# Patient Record
Sex: Female | Born: 1990 | Race: Black or African American | Hispanic: No | Marital: Single | Smoking: Former smoker
Health system: Southern US, Community
[De-identification: ages and names within clinical notes are randomized; demographics above are authoritative.]

## PROBLEM LIST (undated history)

## (undated) ENCOUNTER — Inpatient Hospital Stay (HOSPITAL_COMMUNITY): Payer: Self-pay

## (undated) DIAGNOSIS — R519 Headache, unspecified: Secondary | ICD-10-CM

## (undated) DIAGNOSIS — J45909 Unspecified asthma, uncomplicated: Secondary | ICD-10-CM

## (undated) DIAGNOSIS — B009 Herpesviral infection, unspecified: Secondary | ICD-10-CM

## (undated) DIAGNOSIS — L732 Hidradenitis suppurativa: Secondary | ICD-10-CM

## (undated) DIAGNOSIS — G4730 Sleep apnea, unspecified: Secondary | ICD-10-CM

## (undated) DIAGNOSIS — K8020 Calculus of gallbladder without cholecystitis without obstruction: Secondary | ICD-10-CM

## (undated) DIAGNOSIS — A599 Trichomoniasis, unspecified: Secondary | ICD-10-CM

## (undated) DIAGNOSIS — D649 Anemia, unspecified: Secondary | ICD-10-CM

## (undated) HISTORY — PX: LAPAROSCOPIC CHOLECYSTECTOMY W/ CHOLANGIOGRAPHY: SUR757

## (undated) HISTORY — PX: OTHER SURGICAL HISTORY: SHX169

## (undated) HISTORY — DX: Headache, unspecified: R51.9

## (undated) HISTORY — DX: Unspecified asthma, uncomplicated: J45.909

## (undated) HISTORY — DX: Sleep apnea, unspecified: G47.30

---
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
## 2010-04-03 ENCOUNTER — Emergency Department (HOSPITAL_COMMUNITY): Admission: EM | Admit: 2010-04-03 | Discharge: 2010-04-03 | Payer: Self-pay | Admitting: Emergency Medicine

## 2011-03-06 LAB — URINALYSIS, ROUTINE W REFLEX MICROSCOPIC
Bilirubin Urine: NEGATIVE
Glucose, UA: NEGATIVE mg/dL
Hgb urine dipstick: NEGATIVE
Ketones, ur: NEGATIVE mg/dL
Nitrite: NEGATIVE
Protein, ur: NEGATIVE mg/dL
Specific Gravity, Urine: 1.01 (ref 1.005–1.030)
Urobilinogen, UA: 0.2 mg/dL (ref 0.0–1.0)
pH: 7 (ref 5.0–8.0)

## 2011-03-06 LAB — URINE MICROSCOPIC-ADD ON

## 2011-03-06 LAB — URINE CULTURE

## 2011-03-06 LAB — POCT PREGNANCY, URINE: Preg Test, Ur: NEGATIVE

## 2011-11-09 LAB — OB RESULTS CONSOLE ANTIBODY SCREEN: Antibody Screen: NEGATIVE

## 2011-11-09 LAB — OB RESULTS CONSOLE ABO/RH

## 2011-12-06 ENCOUNTER — Inpatient Hospital Stay (HOSPITAL_COMMUNITY)
Admission: AD | Admit: 2011-12-06 | Discharge: 2011-12-06 | Disposition: A | Discharge status: Home / Self Care | Payer: Medicaid Other | Source: Ambulatory Visit | Attending: Obstetrics & Gynecology | Admitting: Obstetrics & Gynecology

## 2011-12-06 ENCOUNTER — Encounter (HOSPITAL_COMMUNITY): Payer: Self-pay | Admitting: *Deleted

## 2011-12-06 DIAGNOSIS — N390 Urinary tract infection, site not specified: Secondary | ICD-10-CM

## 2011-12-06 DIAGNOSIS — O219 Vomiting of pregnancy, unspecified: Secondary | ICD-10-CM

## 2011-12-06 DIAGNOSIS — J069 Acute upper respiratory infection, unspecified: Secondary | ICD-10-CM | POA: Insufficient documentation

## 2011-12-06 DIAGNOSIS — O99891 Other specified diseases and conditions complicating pregnancy: Secondary | ICD-10-CM | POA: Insufficient documentation

## 2011-12-06 MED ORDER — GUAIFENESIN ER 600 MG PO TB12: 1200.0000 mg | ORAL_TABLET | Freq: Two times a day (BID) | ORAL | Status: DC

## 2011-12-06 MED ORDER — SULFAMETHOXAZOLE-TMP DS 800-160 MG PO TABS: 1.0000 | ORAL_TABLET | Freq: Two times a day (BID) | ORAL | Status: AC

## 2011-12-06 MED ORDER — ONDANSETRON HCL 4 MG PO TABS: 4.0000 mg | ORAL_TABLET | Freq: Every day | ORAL | Status: DC | PRN

## 2011-12-06 MED ORDER — LORATADINE 10 MG PO TABS: 10.0000 mg | ORAL_TABLET | Freq: Every day | ORAL | Status: DC

## 2011-12-06 MED ORDER — DEXTROMETHORPHAN HBR 15 MG/5ML PO SYRP: 10.0000 mL | ORAL_SOLUTION | Freq: Four times a day (QID) | ORAL | Status: AC | PRN

## 2011-12-06 NOTE — Progress Notes (Signed)
N. Frazier, CNM at bedside.  Assessment done and poc discussed with pt.  

## 2011-12-06 NOTE — Progress Notes (Signed)
PT SAYS HAS HAD RUNNY/ STUFFY NOSE X 2 DAYS, SORE THROAT, H/A.  VOMITING  HAS HAD ALL PREG.     HAS HAD LOOSE STOOLS.    NO FEVER AT HOME.  SAYS COUGHING AT HOME- NONE IN TRIAGE.   DENIES CRAMPING.    TOOK TYLENOL AT  6 PM  FOR H/A-  LITTLE RELIEF.

## 2011-12-06 NOTE — Progress Notes (Signed)
Pt presents to [facility] for cold symptoms that started 3 days ago.

## 2011-12-06 NOTE — ED Provider Notes (Signed)
History     No chief complaint on file.  HPI 20 y.o. G1P0 at [redacted]w[redacted]d with c/o headache, sore throat, cough, congestion x 3 days, tylenol helps with headache, hasn't tried any other meds. Denies fever. Vomiting throughout pregnancy, about every other day.    Past Medical History  Diagnosis Date  . No pertinent past medical history     History reviewed. No pertinent past surgical history.  History reviewed. No pertinent family history.  History  Substance Use Topics  . Smoking status: Never Smoker   . Smokeless tobacco: Not on file  . Alcohol Use: No    Allergies: No Known Allergies  Prescriptions prior to admission  Medication Sig Dispense Refill  . acetaminophen (TYLENOL) 325 MG suppository Place 325 mg rectally every 4 (four) hours as needed. Head ache       . Prenatal Vit-Fe Fumarate-FA (PRENATAL MULTIVITAMIN) TABS Take 1 tablet by mouth daily.          Review of Systems  Constitutional: Negative.  Negative for fever.  HENT: Positive for congestion and sore throat.   Respiratory: Positive for cough.   Cardiovascular: Negative.   Gastrointestinal: Negative for nausea, vomiting, abdominal pain, diarrhea and constipation.  Genitourinary: Negative for dysuria, urgency, frequency, hematuria and flank pain.       Negative for vaginal bleeding, cramping/contractions  Musculoskeletal: Negative.   Neurological: Positive for headaches.  Psychiatric/Behavioral: Negative.    Physical Exam   Blood pressure 107/65, pulse 85, temperature 98.8 F (37.1 C), temperature source Oral, resp. rate 20, height 5\' 9"  (1.753 m), weight 218 lb (98.884 kg), SpO2 98.00%.  Physical Exam  Nursing note and vitals reviewed. Constitutional: She is oriented to person, place, and time. She appears well-developed and well-nourished. No distress.  HENT:  Head: Normocephalic and atraumatic.  Right Ear: Tympanic membrane, external ear and ear canal normal.  Left Ear: Tympanic membrane, external ear  and ear canal normal.  Nose: Mucosal edema and rhinorrhea present.  Mouth/Throat: Uvula is midline and mucous membranes are normal. Posterior oropharyngeal erythema present. No oropharyngeal exudate.  Cardiovascular: Normal rate and regular rhythm.   Respiratory: Effort normal and breath sounds normal. No respiratory distress. She has no wheezes. She has no rales.  GI: Soft. She exhibits no distension. There is no tenderness.  Musculoskeletal: Normal range of motion.  Neurological: She is alert and oriented to person, place, and time.  Skin: Skin is warm and dry.  Psychiatric: She has a normal mood and affect.    MAU Course  Procedures    Assessment and Plan  20 y.o. G1P0 at [redacted]w[redacted]d URI - symptomatic treatment, f/u if no improvement if 7 days   FRAZIER,NATALIE 12/06/2011, 9:31 PM

## 2011-12-06 NOTE — Progress Notes (Signed)
LAST TIME VOMITED  WAS THIS AM-  DOESN;T TAKE ANY MEDS.  PT YAWNING A LOT.

## 2011-12-18 NOTE — L&D Delivery Note (Signed)
He is in anDelivery Note At 1:48 AM a viable female was delivered via Vaginal, Spontaneous Delivery (Presentation: Left Occiput Anterior).  APGAR: 8, 9; weight .   Placenta status: Intact, Spontaneous.  Cord: 3 vessels with the following complications: None.  Cord pH: none  Anesthesia: Epidural  Episiotomy: None Lacerations: Periurethral Suture Repair: 3.0 vicryl rapide Est. Blood Loss (mL):   Mom to postpartum.  Baby to nursery-stable.  Jessica Vasquez A 04/09/2012, 2:13 AM

## 2012-01-09 LAB — OB RESULTS CONSOLE RUBELLA ANTIBODY, IGM: Rubella: IMMUNE

## 2012-01-09 LAB — OB RESULTS CONSOLE GC/CHLAMYDIA: Gonorrhea: NEGATIVE

## 2012-01-09 LAB — OB RESULTS CONSOLE HEPATITIS B SURFACE ANTIGEN: Hepatitis B Surface Ag: NEGATIVE

## 2012-01-10 ENCOUNTER — Encounter (HOSPITAL_COMMUNITY): Payer: Self-pay | Admitting: *Deleted

## 2012-01-10 ENCOUNTER — Inpatient Hospital Stay (HOSPITAL_COMMUNITY)
Admission: AD | Admit: 2012-01-10 | Discharge: 2012-01-10 | Disposition: A | Discharge status: Home / Self Care | Payer: 59 | Source: Ambulatory Visit | Attending: Obstetrics | Admitting: Obstetrics

## 2012-01-10 ENCOUNTER — Inpatient Hospital Stay (HOSPITAL_COMMUNITY): Payer: 59

## 2012-01-10 DIAGNOSIS — O4690 Antepartum hemorrhage, unspecified, unspecified trimester: Secondary | ICD-10-CM | POA: Insufficient documentation

## 2012-01-10 DIAGNOSIS — O4693 Antepartum hemorrhage, unspecified, third trimester: Secondary | ICD-10-CM

## 2012-01-10 LAB — WET PREP, GENITAL
Clue Cells Wet Prep HPF POC: NONE SEEN
Trich, Wet Prep: NONE SEEN

## 2012-01-10 NOTE — ED Provider Notes (Signed)
History     Chief Complaint  Patient presents with  . Vaginal Bleeding   HPI This is a 21 y.o. G1P0 at [redacted]w[redacted]d who presents with c/o bright red vaginal bleeding an hour ago. States it was "alot" but did not have a pad on when she arrived. No pain or leaking of fluid. + fetal movement. Denies any pregnancy problems.  OB History    Grav Para Term Preterm Abortions TAB SAB Ect Mult Living   1               Past Medical History  Diagnosis Date  . No pertinent past medical history     Past Surgical History  Procedure Date  . No past surgeries     History reviewed. No pertinent family history.  History  Substance Use Topics  . Smoking status: Never Smoker   . Smokeless tobacco: Not on file  . Alcohol Use: No    Allergies: No Known Allergies  Prescriptions prior to admission  Medication Sig Dispense Refill  . guaiFENesin (MUCINEX) 600 MG 12 hr tablet Take 1,200 mg by mouth 2 (two) times daily as needed. For cold symptoms      . guaiFENesin (ROBITUSSIN) 100 MG/5ML liquid Take 200 mg by mouth 3 (three) times daily as needed. For cold symptoms      . Prenatal Vit-Fe Fumarate-FA (PRENATAL MULTIVITAMIN) TABS Take 1 tablet by mouth daily.        Marland Kitchen DISCONTD: guaiFENesin (MUCINEX) 600 MG 12 hr tablet Take 2 tablets (1,200 mg total) by mouth 2 (two) times daily.  30 tablet  0    ROS As above  Physical Exam   Blood pressure 122/63, pulse 74, temperature 97.4 F (36.3 C), temperature source Oral, resp. rate 20, height 5\' 9"  (1.753 m), weight 224 lb 8 oz (101.833 kg), SpO2 99.00%.  Physical Exam  Constitutional: She is oriented to person, place, and time. She appears well-developed and well-nourished. No distress.  HENT:  Head: Normocephalic.  Cardiovascular: Normal rate.   Respiratory: Effort normal.  GI: Soft. She exhibits no distension. There is no tenderness. There is no rebound and no guarding.  Genitourinary: Uterus normal. Vaginal discharge (white discharge only , NO sign  of any blood in vault, Cervix long and closed) found.       FHR reactive with no contractions.   Musculoskeletal: Normal range of motion.  Neurological: She is alert and oriented to person, place, and time.  Skin: Skin is warm and dry.  Psychiatric: She has a normal mood and affect.  Verbal report from Sonographer:  No previa  MAU Course  Procedures Discussed with Dr Clearance Coots. WIll check placental location since no prenatal records are in system yet.   Assessment and Plan  A:  Report of vaginal bleeding with No clinical evidence of any bleediing      SIUP at [redacted]w[redacted]d P:  D/C home with reassurance per Dr Clearance Coots.  Wynelle Bourgeois 01/10/2012, 10:16 PM

## 2012-01-10 NOTE — Progress Notes (Signed)
Small amount of vaginal bleeding that started today. Patient also states she has been having thick white discharge x 1week

## 2012-01-11 LAB — GC/CHLAMYDIA PROBE AMP, GENITAL: GC Probe Amp, Genital: NEGATIVE

## 2012-01-17 ENCOUNTER — Inpatient Hospital Stay (HOSPITAL_COMMUNITY)
Admission: AD | Admit: 2012-01-17 | Discharge: 2012-01-17 | Disposition: A | Discharge status: Home / Self Care | Payer: 59 | Source: Ambulatory Visit | Attending: Obstetrics & Gynecology | Admitting: Obstetrics & Gynecology

## 2012-01-17 ENCOUNTER — Encounter (HOSPITAL_COMMUNITY): Payer: Self-pay | Admitting: *Deleted

## 2012-01-17 DIAGNOSIS — O368190 Decreased fetal movements, unspecified trimester, not applicable or unspecified: Secondary | ICD-10-CM | POA: Insufficient documentation
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
## 2012-03-12 ENCOUNTER — Encounter (HOSPITAL_COMMUNITY): Payer: Self-pay | Admitting: *Deleted

## 2012-03-12 ENCOUNTER — Inpatient Hospital Stay (HOSPITAL_COMMUNITY)
Admission: AD | Admit: 2012-03-12 | Discharge: 2012-03-12 | Disposition: A | Discharge status: Home / Self Care | Payer: Medicaid Other | Attending: Obstetrics | Admitting: Obstetrics

## 2012-03-12 DIAGNOSIS — O479 False labor, unspecified: Secondary | ICD-10-CM | POA: Insufficient documentation

## 2012-03-12 NOTE — MAU Provider Note (Signed)
  History   Pt presents today c/o possible SROM. She is currently 37.1wks and states when she woke up this am, she noticed that her underwear was slightly wet. She denies continued "leaking" and states she has not had to wear a pad. She reports GFM. She denies vag bleeding or any other sx at this time.   CSN: [id number]  Arrival date and time: 03/12/12 0646   None     No chief complaint on file.  HPI  OB History    Grav Para Term Preterm Abortions TAB SAB Ect Mult Living   1               Past Medical History  Diagnosis Date  . No pertinent past medical history     Past Surgical History  Procedure Date  . Nasal sugery     No family history on file.  History  Substance Use Topics  . Smoking status: Former Smoker    Quit date: 05/16/2011  . Smokeless tobacco: Not on file  . Alcohol Use: No    Allergies: No Known Allergies  Prescriptions prior to admission  Medication Sig Dispense Refill  . Prenatal Vit-Fe Fumarate-FA (PRENATAL MULTIVITAMIN) TABS Take 1 tablet by mouth daily.         Review of Systems  Constitutional: Negative for fever and chills.  Eyes: Negative for blurred vision and double vision.  Respiratory: Negative for cough, hemoptysis, sputum production, shortness of breath and wheezing.   Cardiovascular: Negative for chest pain and palpitations.  Gastrointestinal: Negative for nausea, vomiting, abdominal pain, diarrhea and constipation.  Genitourinary: Negative for dysuria, urgency, frequency and hematuria.  Neurological: Negative for dizziness and headaches.  Psychiatric/Behavioral: Negative for depression and suicidal ideas.   Physical Exam   Blood pressure 118/71, pulse 87, temperature 98 F (36.7 C), temperature source Oral, resp. rate 20, height 5\' 9"  (1.753 m), weight 228 lb 2 oz (103.477 kg).  Physical Exam  Nursing note and vitals reviewed. Constitutional: She is oriented to person, place, and time. She appears well-developed and  well-nourished. No distress.  HENT:  Head: Normocephalic and atraumatic.  Eyes: Pupils are equal, round, and reactive to light.  GI: Soft. She exhibits no distension and no mass. There is no tenderness. There is no rebound and no guarding.  Genitourinary: No bleeding around the vagina. No vaginal discharge found.       Cervix is friable. No pooling noted in the vagina. Cervix Lg/closed.  Neurological: She is alert and oriented to person, place, and time.  Skin: Skin is warm and dry. She is not diaphoretic.  Psychiatric: She has a normal mood and affect. Her behavior is normal. Judgment and thought content normal.    MAU Course  Procedures  Fern test negative.   NST reactive with some irritability. No consistent ctx pattern noted.  Assessment and Plan  Braxton Hicks ctx: pt likely experienced some stress urinary incontinence as a result of fetal position and movement. No evidence of SROM at this time. She has an appt later today with Dr. Clearance Coots. She will keep this appt. Discussed diet, activity, risks, and precautions.   Clinton Gallant. Elio Haden III, DrHSc, MPAS, PA-C  03/12/2012, 7:22 AM

## 2012-03-12 NOTE — Discharge Instructions (Signed)

## 2012-03-12 NOTE — MAU Note (Signed)
PT SAYS WAS ASLEEP- UP TO B-ROOM- BED WET-  DID NOT SMELL LIKE URINE.  NO  FLUID NOW- AND NONE SINCE.   NO VE IN OFFICE

## 2012-04-08 ENCOUNTER — Inpatient Hospital Stay (HOSPITAL_COMMUNITY)
Admission: RE | Admit: 2012-04-08 | Discharge: 2012-04-11 | DRG: 775 | Disposition: A | Discharge status: Home / Self Care | Payer: 59 | Source: Ambulatory Visit | Attending: Obstetrics | Admitting: Obstetrics

## 2012-04-08 ENCOUNTER — Inpatient Hospital Stay (HOSPITAL_COMMUNITY): Payer: 59 | Admitting: Anesthesiology

## 2012-04-08 ENCOUNTER — Encounter (HOSPITAL_COMMUNITY): Payer: Self-pay | Admitting: Anesthesiology

## 2012-04-08 ENCOUNTER — Encounter (HOSPITAL_COMMUNITY): Payer: Self-pay

## 2012-04-08 DIAGNOSIS — Z22330 Carrier of Group B streptococcus: Secondary | ICD-10-CM

## 2012-04-08 DIAGNOSIS — O99892 Other specified diseases and conditions complicating childbirth: Secondary | ICD-10-CM | POA: Diagnosis present

## 2012-04-08 DIAGNOSIS — O480 Post-term pregnancy: Principal | ICD-10-CM | POA: Diagnosis present

## 2012-04-08 LAB — CBC
MCV: 86.1 fL (ref 78.0–100.0)
Platelets: 205 10*3/uL (ref 150–400)
RDW: 13.8 % (ref 11.5–15.5)
WBC: 7.9 10*3/uL (ref 4.0–10.5)

## 2012-04-08 LAB — RPR: RPR Ser Ql: NONREACTIVE

## 2012-04-08 MED ORDER — IBUPROFEN 600 MG PO TABS: 600.0000 mg | ORAL_TABLET | Freq: Four times a day (QID) | ORAL | Status: DC | PRN

## 2012-04-08 MED ORDER — FLEET ENEMA 7-19 GM/118ML RE ENEM: 1.0000 | ENEMA | RECTAL | Status: DC | PRN

## 2012-04-08 MED ORDER — FENTANYL 2.5 MCG/ML BUPIVACAINE 1/10 % EPIDURAL INFUSION (WH - ANES)
14.0000 mL/h | INTRAMUSCULAR | Status: DC
  Administered 2012-04-08: 14 mL/h via EPIDURAL
  Filled 2012-04-08 (×2): qty 60

## 2012-04-08 MED ORDER — OXYCODONE-ACETAMINOPHEN 5-325 MG PO TABS: 1.0000 | ORAL_TABLET | ORAL | Status: DC | PRN

## 2012-04-08 MED ORDER — PENICILLIN G POTASSIUM 5000000 UNITS IJ SOLR
2.5000 10*6.[IU] | INTRAVENOUS | Status: DC
  Administered 2012-04-08 (×3): 2.5 10*6.[IU] via INTRAVENOUS
  Filled 2012-04-08 (×7): qty 2.5

## 2012-04-08 MED ORDER — DEXTROSE 5 % IV SOLN
5.0000 10*6.[IU] | Freq: Once | INTRAVENOUS | Status: AC
  Administered 2012-04-08: 5 10*6.[IU] via INTRAVENOUS
  Filled 2012-04-08: qty 5

## 2012-04-08 MED ORDER — PROCHLORPERAZINE EDISYLATE 5 MG/ML IJ SOLN
10.0000 mg | Freq: Four times a day (QID) | INTRAMUSCULAR | Status: DC | PRN
  Administered 2012-04-08: 10 mg via INTRAMUSCULAR
  Filled 2012-04-08: qty 2

## 2012-04-08 MED ORDER — OXYTOCIN 20 UNITS IN LACTATED RINGERS INFUSION - SIMPLE: 125.0000 mL/h | Freq: Once | INTRAVENOUS | Status: DC

## 2012-04-08 MED ORDER — OXYTOCIN BOLUS FROM INFUSION
500.0000 mL | Freq: Once | INTRAVENOUS | Status: DC
  Filled 2012-04-08: qty 1000
  Filled 2012-04-08: qty 500

## 2012-04-08 MED ORDER — FENTANYL 2.5 MCG/ML BUPIVACAINE 1/10 % EPIDURAL INFUSION (WH - ANES)
INTRAMUSCULAR | Status: DC | PRN
  Administered 2012-04-08: 14 mL/h via EPIDURAL

## 2012-04-08 MED ORDER — LACTATED RINGERS IV SOLN: 500.0000 mL | INTRAVENOUS | Status: DC | PRN

## 2012-04-08 MED ORDER — LACTATED RINGERS IV SOLN: 500.0000 mL | Freq: Once | INTRAVENOUS | Status: DC

## 2012-04-08 MED ORDER — PHENYLEPHRINE 40 MCG/ML (10ML) SYRINGE FOR IV PUSH (FOR BLOOD PRESSURE SUPPORT): 80.0000 ug | PREFILLED_SYRINGE | INTRAVENOUS | Status: DC | PRN

## 2012-04-08 MED ORDER — DIPHENHYDRAMINE HCL 50 MG/ML IJ SOLN: 12.5000 mg | INTRAMUSCULAR | Status: DC | PRN

## 2012-04-08 MED ORDER — PHENYLEPHRINE 40 MCG/ML (10ML) SYRINGE FOR IV PUSH (FOR BLOOD PRESSURE SUPPORT)
80.0000 ug | PREFILLED_SYRINGE | INTRAVENOUS | Status: DC | PRN
  Filled 2012-04-08: qty 5

## 2012-04-08 MED ORDER — MISOPROSTOL 25 MCG QUARTER TABLET
25.0000 ug | ORAL_TABLET | ORAL | Status: DC | PRN
  Administered 2012-04-08: 25 ug via VAGINAL
  Filled 2012-04-08: qty 0.25
  Filled 2012-04-08: qty 1

## 2012-04-08 MED ORDER — NALBUPHINE HCL 10 MG/ML IJ SOLN
10.0000 mg | Freq: Four times a day (QID) | INTRAMUSCULAR | Status: DC | PRN
  Administered 2012-04-08: 10 mg via INTRAMUSCULAR
  Filled 2012-04-08: qty 1

## 2012-04-08 MED ORDER — ONDANSETRON HCL 4 MG/2ML IJ SOLN: 4.0000 mg | Freq: Four times a day (QID) | INTRAMUSCULAR | Status: DC | PRN

## 2012-04-08 MED ORDER — EPHEDRINE 5 MG/ML INJ: 10.0000 mg | INTRAVENOUS | Status: DC | PRN

## 2012-04-08 MED ORDER — LIDOCAINE HCL (PF) 1 % IJ SOLN
30.0000 mL | INTRAMUSCULAR | Status: DC | PRN
  Filled 2012-04-08: qty 30

## 2012-04-08 MED ORDER — CITRIC ACID-SODIUM CITRATE 334-500 MG/5ML PO SOLN: 30.0000 mL | ORAL | Status: DC | PRN

## 2012-04-08 MED ORDER — OXYTOCIN 20 UNITS IN LACTATED RINGERS INFUSION - SIMPLE
1.0000 m[IU]/min | INTRAVENOUS | Status: DC
  Administered 2012-04-08: 1 m[IU]/min via INTRAVENOUS

## 2012-04-08 MED ORDER — TERBUTALINE SULFATE 1 MG/ML IJ SOLN: 0.2500 mg | Freq: Once | INTRAMUSCULAR | Status: AC | PRN

## 2012-04-08 MED ORDER — LACTATED RINGERS IV SOLN: INTRAVENOUS | Status: DC

## 2012-04-08 MED ORDER — EPHEDRINE 5 MG/ML INJ
10.0000 mg | INTRAVENOUS | Status: DC | PRN
  Filled 2012-04-08: qty 4

## 2012-04-08 MED ORDER — LIDOCAINE HCL (PF) 1 % IJ SOLN
INTRAMUSCULAR | Status: DC | PRN
  Administered 2012-04-08: 4 mL
  Administered 2012-04-08: 5 mL

## 2012-04-08 MED ORDER — NALBUPHINE SYRINGE 5 MG/0.5 ML
10.0000 mg | INJECTION | INTRAMUSCULAR | Status: DC | PRN
  Administered 2012-04-08: 10 mg via INTRAVENOUS
  Filled 2012-04-08 (×2): qty 1

## 2012-04-08 MED ORDER — ACETAMINOPHEN 325 MG PO TABS: 650.0000 mg | ORAL_TABLET | ORAL | Status: DC | PRN

## 2012-04-08 NOTE — Anesthesia Preprocedure Evaluation (Signed)
Anesthesia Evaluation  Patient identified by MRN, date of birth, ID band Patient awake    Reviewed: Allergy & Precautions, H&P , Patient's Chart, lab work & pertinent test results  Airway Mallampati: II TM Distance: >3 FB Neck ROM: full    Dental No notable dental hx. (+) Teeth Intact   Pulmonary neg pulmonary ROS,  breath sounds clear to auscultation  Pulmonary exam normal       Cardiovascular negative cardio ROS  Rhythm:regular Rate:Normal     Neuro/Psych negative neurological ROS  negative psych ROS   GI/Hepatic negative GI ROS, Neg liver ROS,   Endo/Other  negative endocrine ROS  Renal/GU negative Renal ROS  negative genitourinary   Musculoskeletal   Abdominal Normal abdominal exam  (+)   Peds  Hematology negative hematology ROS (+)   Anesthesia Other Findings   Reproductive/Obstetrics (+) Pregnancy                           Anesthesia Physical Anesthesia Plan  ASA: II  Anesthesia Plan: Epidural   Post-op Pain Management:    Induction:   Airway Management Planned:   Additional Equipment:   Intra-op Plan:   Post-operative Plan:   Informed Consent: I have reviewed the patients History and Physical, chart, labs and discussed the procedure including the risks, benefits and alternatives for the proposed anesthesia with the patient or authorized representative who has indicated his/her understanding and acceptance.     Plan Discussed with: Anesthesiologist and Surgeon  Anesthesia Plan Comments:         Anesthesia Quick Evaluation  

## 2012-04-08 NOTE — Progress Notes (Signed)
Jessica Vasquez is a 21 y.o. G1P0 at [redacted]w[redacted]d by LMP admitted for induction of labor due to Post dates. Due date 04-01-12.  Subjective:   Objective: BP 117/57  Pulse 81  Temp(Src) 98.2 F (36.8 C) (Oral)  Resp 20  Ht 5\' 9"  (1.753 m)  Wt 104.327 kg (230 lb)  BMI 33.96 kg/m2      FHT:  FHR: 150 bpm, variability: moderate,  accelerations:  Present,  decelerations:  Absent UC:   regular, every 3 minutes SVE:   Dilation: 2 Effacement (%): 70 Station: 0 Exam by:: lee  Labs: Lab Results  Component Value Date   WBC 7.9 04/08/2012   HGB 11.0* 04/08/2012   HCT 33.4* 04/08/2012   MCV 86.1 04/08/2012   PLT 205 04/08/2012    Assessment / Plan: 41 weeks.  2 stage IOL.  Labor: Progressing normally Preeclampsia:  n/a Fetal Wellbeing:  Category I Pain Control:  Labor support without medications I/D:  n/a Anticipated MOD:  NSVD  Jessica Vasquez A 04/08/2012, 1:14 PM

## 2012-04-08 NOTE — H&P (Signed)
Jessica Vasquez is a 21 y.o. female presenting for IOL.. Maternal Medical History:  Reason for admission: Presents for IOL for postdates.  21 yo G1 at 41 weeks.  Contractions: Frequency: rare.   Duration is approximately 30 seconds.   Perceived severity is mild.    Fetal activity: Perceived fetal activity is normal.   Last perceived fetal movement was within the past hour.    Prenatal complications: no prenatal complications   OB History    Grav Para Term Preterm Abortions TAB SAB Ect Mult Living   1              Past Medical History  Diagnosis Date  . No pertinent past medical history    Past Surgical History  Procedure Date  . Nasal sugery    Family History: family history is not on file. Social History:  reports that she quit smoking about 10 months ago. She has never used smokeless tobacco. She reports that she does not drink alcohol or use illicit drugs.  Review of Systems  All other systems reviewed and are negative.      Blood pressure 123/71, pulse 84, temperature 97.6 F (36.4 C), temperature source Oral, resp. rate 20, height 5\' 9"  (1.753 m), weight 104.327 kg (230 lb). Maternal Exam:  Abdomen: Patient reports no abdominal tenderness. Fetal presentation: vertex  Introitus: Normal vulva. Normal vagina.    Physical Exam  Nursing note and vitals reviewed. Constitutional: She is oriented to person, place, and time. She appears well-developed and well-nourished.  Eyes: Conjunctivae are normal. Pupils are equal, round, and reactive to light.  Neck: Normal range of motion.  Cardiovascular: Normal rate.   Respiratory: Effort normal.  GI: Soft.  Musculoskeletal: Normal range of motion.  Neurological: She is alert and oriented to person, place, and time. She has normal reflexes.  Skin: Skin is warm and dry.    Prenatal labs: ABO, Rh: AB/Positive/-- (11/23 0000) Antibody: Negative (11/23 0000) Rubella: Immune (01/23 0000) RPR: Nonreactive (01/23 0000)  HBsAg:  Negative (01/23 0000)  HIV: Non-reactive (01/23 0000)  GBS: Positive (03/21 0000)   Assessment/Plan: 41 weeks.  2 stage IOL.   Caterra Ostroff A 04/08/2012, 9:17 AM

## 2012-04-08 NOTE — Anesthesia Procedure Notes (Signed)
Epidural Patient location during procedure: OB Start time: 04/08/2012 6:51 PM  Staffing Anesthesiologist: Starlee Corralejo A. Performed by: anesthesiologist   Preanesthetic Checklist Completed: patient identified, site marked, surgical consent, pre-op evaluation, timeout performed, IV checked, risks and benefits discussed and monitors and equipment checked  Epidural Patient position: sitting Prep: site prepped and draped and DuraPrep Patient monitoring: continuous pulse ox and blood pressure Approach: midline Injection technique: LOR air  Needle:  Needle type: Tuohy  Needle gauge: 17 G Needle length: 9 cm Needle insertion depth: 6 cm Catheter type: closed end flexible Catheter size: 19 Gauge Catheter at skin depth: 11 cm Test dose: negative and Other  Assessment Events: blood not aspirated, injection not painful, no injection resistance, negative IV test and no paresthesia  Additional Notes Patient identified. Risks and benefits discussed including failed block, incomplete  Pain control, post dural puncture headache, nerve damage, paralysis, blood pressure Changes, nausea, vomiting, reactions to medications-both toxic and allergic and post Partum back pain. All questions were answered. Patient expressed understanding and wished to proceed. Sterile technique was used throughout procedure. Epidural site was Dressed with sterile barrier dressing. No paresthesias, signs of intravascular injection Or signs of intrathecal spread were encountered.  Patient was more comfortable after the epidural was dosed. Please see RN's note for documentation of vital signs and FHR which are stable.

## 2012-04-09 ENCOUNTER — Encounter (HOSPITAL_COMMUNITY): Payer: Self-pay

## 2012-04-09 MED ORDER — OXYCODONE-ACETAMINOPHEN 5-325 MG PO TABS
1.0000 | ORAL_TABLET | ORAL | Status: DC | PRN
  Administered 2012-04-10 – 2012-04-11 (×2): 1 via ORAL
  Filled 2012-04-09 (×2): qty 1

## 2012-04-09 MED ORDER — OXYTOCIN 10 UNIT/ML IJ SOLN
INTRAMUSCULAR | Status: AC
  Filled 2012-04-09: qty 2

## 2012-04-09 MED ORDER — OXYTOCIN 10 UNIT/ML IJ SOLN
10.0000 [IU] | Freq: Once | INTRAMUSCULAR | Status: AC
  Administered 2012-04-09: 10 [IU]

## 2012-04-09 MED ORDER — LANOLIN HYDROUS EX OINT: TOPICAL_OINTMENT | CUTANEOUS | Status: DC | PRN

## 2012-04-09 MED ORDER — IBUPROFEN 600 MG PO TABS
600.0000 mg | ORAL_TABLET | Freq: Four times a day (QID) | ORAL | Status: DC
  Administered 2012-04-09 – 2012-04-11 (×9): 600 mg via ORAL
  Filled 2012-04-09 (×9): qty 1

## 2012-04-09 MED ORDER — PRENATAL MULTIVITAMIN CH
1.0000 | ORAL_TABLET | Freq: Every day | ORAL | Status: DC
  Administered 2012-04-09 – 2012-04-11 (×3): 1 via ORAL
  Filled 2012-04-09 (×3): qty 1

## 2012-04-09 MED ORDER — ONDANSETRON HCL 4 MG PO TABS: 4.0000 mg | ORAL_TABLET | ORAL | Status: DC | PRN

## 2012-04-09 MED ORDER — METHYLERGONOVINE MALEATE 0.2 MG/ML IJ SOLN: 0.2000 mg | INTRAMUSCULAR | Status: DC | PRN

## 2012-04-09 MED ORDER — ZOLPIDEM TARTRATE 5 MG PO TABS: 5.0000 mg | ORAL_TABLET | Freq: Every evening | ORAL | Status: DC | PRN

## 2012-04-09 MED ORDER — OXYTOCIN 20 UNITS IN LACTATED RINGERS INFUSION - SIMPLE: 125.0000 mL/h | INTRAVENOUS | Status: DC | PRN

## 2012-04-09 MED ORDER — METHYLERGONOVINE MALEATE 0.2 MG PO TABS: 0.2000 mg | ORAL_TABLET | ORAL | Status: DC | PRN

## 2012-04-09 MED ORDER — DIPHENHYDRAMINE HCL 25 MG PO CAPS: 25.0000 mg | ORAL_CAPSULE | Freq: Four times a day (QID) | ORAL | Status: DC | PRN

## 2012-04-09 MED ORDER — SIMETHICONE 80 MG PO CHEW: 80.0000 mg | CHEWABLE_TABLET | ORAL | Status: DC | PRN

## 2012-04-09 MED ORDER — TETANUS-DIPHTH-ACELL PERTUSSIS 5-2.5-18.5 LF-MCG/0.5 IM SUSP
0.5000 mL | Freq: Once | INTRAMUSCULAR | Status: AC
  Administered 2012-04-10: 0.5 mL via INTRAMUSCULAR
  Filled 2012-04-09: qty 0.5

## 2012-04-09 MED ORDER — DIBUCAINE 1 % RE OINT: 1.0000 "application " | TOPICAL_OINTMENT | RECTAL | Status: DC | PRN

## 2012-04-09 MED ORDER — MEDROXYPROGESTERONE ACETATE 150 MG/ML IM SUSP: 150.0000 mg | INTRAMUSCULAR | Status: DC | PRN

## 2012-04-09 MED ORDER — SENNOSIDES-DOCUSATE SODIUM 8.6-50 MG PO TABS
2.0000 | ORAL_TABLET | Freq: Every day | ORAL | Status: DC
  Administered 2012-04-09 – 2012-04-10 (×2): 2 via ORAL

## 2012-04-09 MED ORDER — BENZOCAINE-MENTHOL 20-0.5 % EX AERO
1.0000 "application " | INHALATION_SPRAY | CUTANEOUS | Status: DC | PRN
  Administered 2012-04-09 – 2012-04-10 (×2): 1 via TOPICAL
  Filled 2012-04-09 (×3): qty 56

## 2012-04-09 MED ORDER — ONDANSETRON HCL 4 MG/2ML IJ SOLN: 4.0000 mg | INTRAMUSCULAR | Status: DC | PRN

## 2012-04-09 MED ORDER — WITCH HAZEL-GLYCERIN EX PADS: 1.0000 "application " | MEDICATED_PAD | CUTANEOUS | Status: DC | PRN

## 2012-04-09 NOTE — Anesthesia Postprocedure Evaluation (Signed)
  Anesthesia Post-op Note  Patient: Jessica Vasquez  Procedure(s) Performed: * No procedures listed *  Patient Location: Mother/Baby  Anesthesia Type: Epidural  Level of Consciousness: awake  Airway and Oxygen Therapy: Patient Spontanous Breathing  Post-op Pain: mild  Post-op Assessment: Patient's Cardiovascular Status Stable and Respiratory Function Stable  Post-op Vital Signs: stable  Complications: No apparent anesthesia complications

## 2012-04-09 NOTE — Progress Notes (Signed)
Post Partum Day 0 Subjective: no complaints  Objective: Blood pressure 94/55, pulse 92, temperature 98.4 F (36.9 C), temperature source Oral, resp. rate 18, height 5\' 9"  (1.753 m), weight 104.327 kg (230 lb), SpO2 99.00%, unknown if currently breastfeeding.  Physical Exam:  General: alert and no distress Lochia: appropriate Uterine Fundus: firm Incision: healing well DVT Evaluation: No evidence of DVT seen on physical exam.   Basename 04/08/12 0730  HGB 11.0*  HCT 33.4*    Assessment/Plan: Doing well.  Routine.   LOS: 1 day   Byford Schools A 04/09/2012, 7:42 AM

## 2012-04-09 NOTE — Progress Notes (Signed)
Jessica Vasquez is a 21 y.o. G1P0000 at [redacted]w[redacted]d by LMP admitted for induction of labor due to Post dates. Due date 04-01-2012.  Subjective:   Objective: BP 107/68  Pulse 107  Temp(Src) 97.8 F (36.6 C) (Oral)  Resp 20  Ht 5\' 9"  (1.753 m)  Wt 104.327 kg (230 lb)  BMI 33.96 kg/m2  SpO2 99%      FHT:  FHR: 150 bpm, variability: moderate,  accelerations:  Present,  decelerations:  Absent UC:   regular, every 3 minutes SVE:   Dilation: 10 Effacement (%): 100 Station: +3 Exam by:: H.Norton, RN   Labs: Lab Results  Component Value Date   WBC 7.9 04/08/2012   HGB 11.0* 04/08/2012   HCT 33.4* 04/08/2012   MCV 86.1 04/08/2012   PLT 205 04/08/2012    Assessment / Plan: Induction of labor due to postdates,  progressing well on pitocin  Labor: Progressing normally Preeclampsia:  n/a Fetal Wellbeing:  Category I Pain Control:  Epidural I/D:  n/a Anticipated MOD:  NSVD  Franke Menter A 04/09/2012, 1:29 AM

## 2012-04-09 NOTE — Progress Notes (Deleted)
Post Partum Day 2 Subjective: no complaints  Objective: Blood pressure 94/55, pulse 92, temperature 98.4 F (36.9 C), temperature source Oral, resp. rate 18, height 5\' 9"  (1.753 m), weight 104.327 kg (230 lb), SpO2 99.00%, unknown if currently breastfeeding.  Physical Exam:  General: alert and no distress Lochia: appropriate Uterine Fundus: firm Incision: healing well DVT Evaluation: No evidence of DVT seen on physical exam.   Basename 04/08/12 0730  HGB 11.0*  HCT 33.4*    Assessment/Plan: Discharge home   LOS: 1 day   Magda Muise A 04/09/2012, 7:40 AM

## 2012-04-10 LAB — CBC
HCT: 29.7 % — ABNORMAL LOW (ref 36.0–46.0)
MCV: 86.8 fL (ref 78.0–100.0)
RBC: 3.42 MIL/uL — ABNORMAL LOW (ref 3.87–5.11)
WBC: 10.1 10*3/uL (ref 4.0–10.5)

## 2012-04-10 NOTE — Progress Notes (Signed)
Post Partum Day 1 Subjective: no complaints  Objective: Blood pressure 112/75, pulse 83, temperature 98.1 F (36.7 C), temperature source Oral, resp. rate 18, height 5\' 9"  (1.753 m), weight 104.327 kg (230 lb), SpO2 99.00%, unknown if currently breastfeeding.  Physical Exam:  General: alert and no distress Lochia: appropriate Uterine Fundus: firm Incision: healing well DVT Evaluation: No evidence of DVT seen on physical exam.   Basename 04/10/12 0527 04/08/12 0730  HGB 9.7* 11.0*  HCT 29.7* 33.4*    Assessment/Plan: Plan for discharge tomorrow   LOS: 2 days   Mallorie Norrod A 04/10/2012, 9:33 AM

## 2012-04-11 MED ORDER — IBUPROFEN 600 MG PO TABS: 600.0000 mg | ORAL_TABLET | Freq: Four times a day (QID) | ORAL | Status: AC | PRN

## 2012-04-11 NOTE — Progress Notes (Signed)
Post Partum Day 2 Subjective: no complaints, up ad lib, voiding, tolerating PO, + flatus and desires DC home.   Objective: Blood pressure 97/61, pulse 76, temperature 98.6 F (37 C), temperature source Oral, resp. rate 18, height 5\' 9"  (1.753 m), weight 104.327 kg (230 lb), SpO2 99.00%, unknown if currently breastfeeding.  Physical Exam:  General: alert, cooperative, appears stated age and no distress Lochia: appropriate Uterine Fundus: firm Incision: healing well DVT Evaluation: No evidence of DVT seen on physical exam. Negative Homan's sign.   Basename 04/10/12 0527  HGB 9.7*  HCT 29.7*    Assessment/Plan: Discharge home and Contraception undecided.   LOS: 3 days   Vasquez, Jessica Trimmer 04/11/2012, 10:03 AM

## 2012-04-11 NOTE — Discharge Instructions (Signed)
Postpartum Care After Vaginal Delivery  After you deliver your baby, you will stay in the hospital for 24 to 72 hours, unless there were problems with the labor or delivery, or you have medical problems. While you are in the hospital, you will receive help and instructions on how to care for yourself and your baby.  Your doctor will order pain medicine, in case you need it. You will have a small amount of bleeding from your vagina and should change your sanitary pad frequently. Wash your hands thoroughly with soap and water for at least 20 seconds after changing pads and using the toilet. Let the nurses know if you begin to pass blood clots or your bleeding increases. Do not flush blood clots down the toilet before having the nurse look at them, to make sure there is no placental tissue with them.  If you had an intravenous (IV), it will be removed within 24 hours, if there are no problems. The first time you get out of bed or take a shower, call the nurse to help you because you may get weak, lightheaded, or even faint. If you are breastfeeding, you may feel painful contractions of your uterus for a couple of weeks. This is normal. The contractions help your uterus get back to normal size. If you are not breastfeeding, wear a supportive bra and handle your breasts as little as possible until your milk has dried up. Hormones should not be given to dry up the breasts, because they can cause blood clots. You will be given your normal diet, unless you have diabetes or other medical problems.   The nurses may put an ice pack on your episiotomy (surgically enlarged opening), if you have one, to reduce the pain and swelling. On rare occasions, you may not be able to urinate and the nurse will need to empty your bladder with a catheter. If you had a postpartum tubal ligation ("tying tubes," female sterilization), it should not make your stay in the hospital longer.  You may have your baby in your room with you as much as  you like, unless you or the baby has a problem. Use the bassinet (basket) for the baby when going to and from the nursery. Do not carry the baby. Do not leave the postpartum area. If the mother is Rh negative (lacks a protein on the red blood cells) and the baby is Rh positive, the mother should get a Rho-gam shot to prevent Rh problems with future pregnancies.  You may be given written instructions for you and your baby, and necessary medicines, when you are discharged from the hospital. Be sure you understand and follow the instructions as advised.  HOME CARE INSTRUCTIONS   · Follow instructions and take the medicines given to you.   · Only take over-the-counter or prescription medicines for pain, discomfort, or fever as directed by your caregiver.   · Do not take aspirin, because it can cause bleeding.   · Increase your activities a little bit every day to build up your strength and endurance.   · Do not drink alcohol, especially if you are breastfeeding or taking pain medicine.   · Take your temperature twice a day and record it.   · You may have a small amount of bleeding or spotting for 2 to 4 weeks. This is normal.   · Do not use tampons or douche. Use sanitary pads.   · Try to have someone stay and help you for a   the baby is sleeping.   If you are breastfeeding, wear a good support bra. If you are not breastfeeding, wear a supportive bra and do not stimulate your nipples.   Eat a healthy, nutritious diet and continue to take your prenatal vitamins.   Do not drive, do any heavy activities, or travel until your caregiver tells you it is okay.   Do not have intercourse until your caregiver gives you permission to do so.   Ask your caregiver when you can begin to exercise and what type of exercises to do.   Call your caregiver if you think you are having a problem from your delivery.   Call your pediatrician if you are having a problem  with the baby.   Schedule your postpartum visit and keep it.  SEEK MEDICAL CARE IF:   You have a temperature of 100 F (37.8 C) or higher.   You have increased vaginal bleeding or are passing clots. Save any clots to show your caregiver.   You have bloody urine or pain when you urinate.   You have a bad smelling vaginal discharge.   You have increasing pain or swelling on your episiotomy.   You develop a severe headache.   You feel depressed.   The episiotomy is separating.   You become dizzy or lightheaded.   You develop a rash.   You have a reaction or problems with your medicine.   You have pain, redness, or swelling at the intravenous site.  SEEK IMMEDIATE MEDICAL CARE IF:   You have chest pain.   You develop shortness of breath.   You pass out.   You develop pain, with or without swelling or redness in your leg.   You develop heavy vaginal bleeding, with or without blood clots.   You develop stomach pain.   You develop a bad smelling vaginal discharge.  MAKE SURE YOU:   Understand these instructions.   Will watch your condition.   Will get help right away if you are not doing well or get worse.  Document Released: 09/30/2007 Document Revised: 11/22/2011 Document Reviewed: 10/12/2009 [facility] Patient Information 2012 [place], [place].  After Your Delivery Discharge Instructions  After Discharge Orders: No future appointments.   Medication List  As of 04/11/2012 10:08 AM   TAKE these medications         ibuprofen 600 MG tablet   Commonly known as: ADVIL,MOTRIN   Take 1 tablet (600 mg total) by mouth every 6 (six) hours as needed for pain.      prenatal multivitamin Tabs   Take 1 tablet by mouth daily.           Medical equipment: none   After your delivery - signs and symptoms to watch for:  Fever - Oral temperature greater than 100.4 degrees Fahrenheit  Foul-smelling vaginal discharge  Headache unrelieved by "pain meds"  Difficulty  urinating  Breasts reddened, hard, hot to the touch  Nipple discharge which is foul-smelling or contains pus  Increased pain at the site of the episiotomy  Difficulty breathing with or without chest pain  New calf pain especially if only on one side  Sudden, continuing increased vaginal bleeding with or without clots  Unrelieved feelings of:  Inability to cope  Sadness  Anxiety  Lack of interest in baby  Insomnia  Crying   What to do at home:  See patient education handouts for full information  Resume activity gradually   Don't lift anything heavier than  baby and carrier until OK'd by your Physician or Midwife  No sex until OK'd by your Physician or Midwife  Take care of yourself by sleeping/resting as much as possible  Eat regular nutritious meals  Let someone else care for you, your baby, and housework as much as possible   Take pain medication as prescribed whenever you need them  Wear compression stockings if prescribed   To avoid/relieve constipation take stool softeners if advised   Drink lots of water/fruit juices  Increase fiber in your diet  Breast care: Wear support bra 24/7; use lanolin ointment/cream, nipple shields or cool compresses as needed   Refer to Newborn Discharge Instructions for problems or follow-up regarding feeding or nursing

## 2012-04-11 NOTE — Discharge Summary (Signed)
Obstetric Discharge Summary Reason for Admission: onset of labor Prenatal Procedures: none Intrapartum Procedures: spontaneous vaginal delivery Postpartum Procedures: none Complications-Operative and Postpartum: Periurethral laceration Hemoglobin  Date Value Range Status  04/10/2012 9.7* 12.0-15.0 (g/dL) Final     HCT  Date Value Range Status  04/10/2012 29.7* 36.0-46.0 (%) Final    Physical Exam:  General: alert, cooperative, appears stated age and no distress Lochia: appropriate Uterine Fundus: firm Incision: healing well DVT Evaluation: No evidence of DVT seen on physical exam.  Discharge Diagnoses: Term Pregnancy-delivered  Discharge Information: Date: 04/11/2012 Activity: pelvic rest Diet: routine Medications: PNV and Ibuprofen Condition: stable Instructions: Pelvic rest, see written DC instructions. Discharge to: home Follow-up Information    Follow up with MARSHALL,BERNARD A, MD in 6 weeks.   Contact information:   [address] [place] [address] [phone]          Newborn Data: Live born female  Birth Weight: 8 lb 13.8 oz (4020 g) APGAR: 8, 9  Home with mother.  HAMBY, Zuleima Haser 04/11/2012, 10:13 AM

## 2012-09-06 ENCOUNTER — Emergency Department (HOSPITAL_COMMUNITY)
Admission: EM | Admit: 2012-09-06 | Discharge: 2012-09-06 | Disposition: A | Discharge status: Home / Self Care | Payer: 59 | Attending: Emergency Medicine | Admitting: Emergency Medicine

## 2012-09-06 ENCOUNTER — Encounter (HOSPITAL_COMMUNITY): Payer: Self-pay | Admitting: Emergency Medicine

## 2012-09-06 ENCOUNTER — Emergency Department (HOSPITAL_COMMUNITY): Payer: 59

## 2012-09-06 DIAGNOSIS — N390 Urinary tract infection, site not specified: Secondary | ICD-10-CM

## 2012-09-06 DIAGNOSIS — A64 Unspecified sexually transmitted disease: Secondary | ICD-10-CM | POA: Insufficient documentation

## 2012-09-06 DIAGNOSIS — R112 Nausea with vomiting, unspecified: Secondary | ICD-10-CM | POA: Insufficient documentation

## 2012-09-06 LAB — URINE MICROSCOPIC-ADD ON

## 2012-09-06 LAB — CBC WITH DIFFERENTIAL/PLATELET
Eosinophils Relative: 2 % (ref 0–5)
HCT: 35 % — ABNORMAL LOW (ref 36.0–46.0)
Hemoglobin: 11.9 g/dL — ABNORMAL LOW (ref 12.0–15.0)
Lymphocytes Relative: 21 % (ref 12–46)
Lymphs Abs: 2.1 10*3/uL (ref 0.7–4.0)
MCV: 82.9 fL (ref 78.0–100.0)
Monocytes Absolute: 0.6 10*3/uL (ref 0.1–1.0)
Monocytes Relative: 6 % (ref 3–12)
RBC: 4.22 MIL/uL (ref 3.87–5.11)
WBC: 10.1 10*3/uL (ref 4.0–10.5)

## 2012-09-06 LAB — URINALYSIS, ROUTINE W REFLEX MICROSCOPIC
Glucose, UA: NEGATIVE mg/dL
Ketones, ur: NEGATIVE mg/dL
Protein, ur: NEGATIVE mg/dL
pH: 7 (ref 5.0–8.0)

## 2012-09-06 LAB — BASIC METABOLIC PANEL
CO2: 28 mEq/L (ref 19–32)
Calcium: 9.6 mg/dL (ref 8.4–10.5)
Chloride: 102 mEq/L (ref 96–112)
Creatinine, Ser: 0.84 mg/dL (ref 0.50–1.10)
Glucose, Bld: 92 mg/dL (ref 70–99)

## 2012-09-06 LAB — WET PREP, GENITAL: Trich, Wet Prep: NONE SEEN

## 2012-09-06 LAB — POCT PREGNANCY, URINE: Preg Test, Ur: NEGATIVE

## 2012-09-06 MED ORDER — ONDANSETRON HCL 4 MG/2ML IJ SOLN
4.0000 mg | Freq: Once | INTRAMUSCULAR | Status: AC
  Administered 2012-09-06: 4 mg via INTRAVENOUS
  Filled 2012-09-06: qty 2

## 2012-09-06 MED ORDER — AZITHROMYCIN 250 MG PO TABS
1000.0000 mg | ORAL_TABLET | Freq: Once | ORAL | Status: AC
  Administered 2012-09-06: 1000 mg via ORAL
  Filled 2012-09-06: qty 4

## 2012-09-06 MED ORDER — SODIUM CHLORIDE 0.9 % IV BOLUS (SEPSIS)
1000.0000 mL | Freq: Once | INTRAVENOUS | Status: AC
Start: 2012-09-06 — End: 2012-09-06
  Administered 2012-09-06: 1000 mL via INTRAVENOUS

## 2012-09-06 MED ORDER — CEFTRIAXONE SODIUM 250 MG IJ SOLR
250.0000 mg | Freq: Once | INTRAMUSCULAR | Status: AC
  Administered 2012-09-06: 250 mg via INTRAMUSCULAR
  Filled 2012-09-06: qty 250

## 2012-09-06 MED ORDER — SULFAMETHOXAZOLE-TRIMETHOPRIM 800-160 MG PO TABS: 1.0000 | ORAL_TABLET | Freq: Two times a day (BID) | ORAL | Status: DC

## 2012-09-06 NOTE — ED Notes (Signed)
Pt presents w/ abdominal onset 1 hour ago, emesis x 1 PTA. Abdominal mid upper abdomen. States no abd pain yesterday or this a.m.

## 2012-09-06 NOTE — ED Notes (Signed)
Pt. is unable to use the restroom at this time, but is aware that we need urine.

## 2012-09-06 NOTE — ED Notes (Signed)
Bedside report received from previous RN 

## 2012-09-06 NOTE — ED Notes (Signed)
Pt walked to BR to give urine sample.

## 2012-09-06 NOTE — ED Notes (Addendum)
NT came and informed me that the pt was wanting to leave AMA. Went to talk with the pt and explained that she would need some antibiotics and that it was important that she stay for that. Pt agreed.  Informed PA, Dierdre Forth of the pt's concerns. Pt also refusing CT scan per PA.

## 2012-09-06 NOTE — ED Provider Notes (Signed)
History     CSN: [id number]  Arrival date & time 09/06/12  1530   First MD Initiated Contact with Patient 09/06/12 1723      Chief Complaint  Patient presents with  . Abdominal Pain    (Consider location/radiation/quality/duration/timing/severity/associated sxs/prior treatment) Patient is a 21 y.o. female presenting with abdominal pain. The history is provided by the patient and medical records.  Abdominal Pain The primary symptoms of the illness include abdominal pain, nausea and vomiting. The primary symptoms of the illness do not include fever, fatigue, shortness of breath, diarrhea or dysuria.  Symptoms associated with the illness do not include diaphoresis, constipation, urgency, hematuria, frequency or back pain.   Jessica Vasquez is a 21 y.o. female presents to the emergency department complaining of abdominal pain.  The onset of the symptoms was  abrupt starting 5 hours ago.  The patient has associated nausea and vomiting.  The symptoms have been  persistent, gradually worsened.  nothing makes the symptoms worse and nothing makes symptoms better.  The patient denies fever, chills, c.  Pt with abdominal pain that began around 1pm after eating an enchilada casserole.  Pain increased throughout the afternoon and pt was unable to defecate thinking that was the problem.  Pt then vomited x1 about 3pm. Pt states after lying for awhile the pain subsided, but increased when she got up to go to the bathroom.  Pt rates her pain at a 3/10, localizes to the epigastric region, is described as a cramping/knot and she denies radiation.  Pt  Still has appendix and gallbladder.  She has never felt like this before.  Pt no longer has nausea.     Past Medical History  Diagnosis Date  . No pertinent past medical history     Past Surgical History  Procedure Date  . Nasal sugery     No family history on file.  History  Substance Use Topics  . Smoking status: Former Smoker    Quit date:  05/16/2011  . Smokeless tobacco: Never Used  . Alcohol Use: No    OB History    Grav Para Term Preterm Abortions TAB SAB Ect Mult Living   1 1 1  0 0 0 0 0 0 1      Review of Systems  Constitutional: Negative for fever, diaphoresis, appetite change, fatigue and unexpected weight change.  HENT: Negative for mouth sores, trouble swallowing, neck pain and neck stiffness.   Respiratory: Negative for cough, chest tightness, shortness of breath, wheezing and stridor.   Cardiovascular: Negative for chest pain and palpitations.  Gastrointestinal: Positive for nausea, vomiting and abdominal pain. Negative for diarrhea, constipation, blood in stool, abdominal distention and rectal pain.  Genitourinary: Negative for dysuria, urgency, frequency, hematuria, flank pain and difficulty urinating.  Musculoskeletal: Negative for back pain.  Skin: Negative for rash.  Neurological: Negative for weakness.  Hematological: Negative for adenopathy.  Psychiatric/Behavioral: Negative for confusion.  All other systems reviewed and are negative.    Allergies  Review of patient's allergies indicates no known allergies.  Home Medications   Current Outpatient Rx  Name Route Sig Dispense Refill  . SULFAMETHOXAZOLE-TRIMETHOPRIM 800-160 MG PO TABS Oral Take 1 tablet by mouth every 12 (twelve) hours. 20 tablet 0    BP 114/62  Pulse 80  Temp 98.2 F (36.8 C) (Oral)  Resp 16  SpO2 100%  LMP 08/31/2012  Physical Exam  Nursing note and vitals reviewed. Constitutional: She appears well-developed and well-nourished.  HENT:  Head:  Normocephalic and atraumatic.  Mouth/Throat: Oropharynx is clear and moist.  Eyes: Conjunctivae normal are normal. No scleral icterus.  Cardiovascular: Normal rate, regular rhythm and intact distal pulses.   Pulmonary/Chest: Effort normal and breath sounds normal.  Abdominal: Soft. Normal appearance and bowel sounds are normal. She exhibits no distension and no mass. There is no  hepatosplenomegaly. There is tenderness in the right lower quadrant, epigastric area, periumbilical area and suprapubic area. There is guarding and tenderness at McBurney's point. There is no rebound, no CVA tenderness and negative Murphy's sign.  Genitourinary: Pelvic exam was performed with patient supine. No labial fusion. There is no rash, tenderness, lesion or injury on the right labia. There is no rash, tenderness, lesion or injury on the left labia. Cervix exhibits discharge. Cervix exhibits no motion tenderness and no friability. Right adnexum displays no mass, no tenderness and no fullness. Left adnexum displays no mass, no tenderness and no fullness. There is erythema around the vagina. No tenderness or bleeding around the vagina. No foreign body around the vagina. No signs of injury around the vagina. Vaginal discharge found.  Neurological: She is alert.  Skin: Skin is warm and dry.  Psychiatric: She has a normal mood and affect.    ED Course  Procedures (including critical care time)  Labs Reviewed  CBC WITH DIFFERENTIAL - Abnormal; Notable for the following:    Hemoglobin 11.9 (*)     HCT 35.0 (*)     All other components within normal limits  URINALYSIS, ROUTINE W REFLEX MICROSCOPIC - Abnormal; Notable for the following:    APPearance CLOUDY (*)     Hgb urine dipstick SMALL (*)     Leukocytes, UA LARGE (*)     All other components within normal limits  URINE MICROSCOPIC-ADD ON - Abnormal; Notable for the following:    Squamous Epithelial / LPF FEW (*)     Bacteria, UA FEW (*)     All other components within normal limits  WET PREP, GENITAL - Abnormal; Notable for the following:    WBC, Wet Prep HPF POC FEW (*)     All other components within normal limits  BASIC METABOLIC PANEL  POCT PREGNANCY, URINE  LIPASE, BLOOD  GC/CHLAMYDIA PROBE AMP, GENITAL   No results found.  Results for orders placed during the hospital encounter of 09/06/12  CBC WITH DIFFERENTIAL       Component Value Range   WBC 10.1  4.0 - 10.5 K/uL   RBC 4.22  3.87 - 5.11 MIL/uL   Hemoglobin 11.9 (*) 12.0 - 15.0 g/dL   HCT 16.1 (*) 09.6 - 04.5 %   MCV 82.9  78.0 - 100.0 fL   MCH 28.2  26.0 - 34.0 pg   MCHC 34.0  30.0 - 36.0 g/dL   RDW 40.9  81.1 - 91.4 %   Platelets 237  150 - 400 K/uL   Neutrophils Relative 72  43 - 77 %   Neutro Abs 7.2  1.7 - 7.7 K/uL   Lymphocytes Relative 21  12 - 46 %   Lymphs Abs 2.1  0.7 - 4.0 K/uL   Monocytes Relative 6  3 - 12 %   Monocytes Absolute 0.6  0.1 - 1.0 K/uL   Eosinophils Relative 2  0 - 5 %   Eosinophils Absolute 0.2  0.0 - 0.7 K/uL   Basophils Relative 0  0 - 1 %   Basophils Absolute 0.0  0.0 - 0.1 K/uL  BASIC METABOLIC PANEL  Component Value Range   Sodium 139  135 - 145 mEq/L   Potassium 4.2  3.5 - 5.1 mEq/L   Chloride 102  96 - 112 mEq/L   CO2 28  19 - 32 mEq/L   Glucose, Bld 92  70 - 99 mg/dL   BUN 13  6 - 23 mg/dL   Creatinine, Ser 7.82  0.50 - 1.10 mg/dL   Calcium 9.6  8.4 - 95.6 mg/dL   GFR calc non Af Amer >90  >90 mL/min   GFR calc Af Amer >90  >90 mL/min  URINALYSIS, ROUTINE W REFLEX MICROSCOPIC      Component Value Range   Color, Urine YELLOW  YELLOW   APPearance CLOUDY (*) CLEAR   Specific Gravity, Urine 1.025  1.005 - 1.030   pH 7.0  5.0 - 8.0   Glucose, UA NEGATIVE  NEGATIVE mg/dL   Hgb urine dipstick SMALL (*) NEGATIVE   Bilirubin Urine NEGATIVE  NEGATIVE   Ketones, ur NEGATIVE  NEGATIVE mg/dL   Protein, ur NEGATIVE  NEGATIVE mg/dL   Urobilinogen, UA 1.0  0.0 - 1.0 mg/dL   Nitrite NEGATIVE  NEGATIVE   Leukocytes, UA LARGE (*) NEGATIVE  POCT PREGNANCY, URINE      Component Value Range   Preg Test, Ur NEGATIVE  NEGATIVE  LIPASE, BLOOD      Component Value Range   Lipase 32  11 - 59 U/L  URINE MICROSCOPIC-ADD ON      Component Value Range   Squamous Epithelial / LPF FEW (*) RARE   WBC, UA TOO NUMEROUS TO COUNT  <3 WBC/hpf   RBC / HPF 0-2  <3 RBC/hpf   Bacteria, UA FEW (*) RARE  WET PREP, GENITAL       Component Value Range   Yeast Wet Prep HPF POC NONE SEEN  NONE SEEN   Trich, Wet Prep NONE SEEN  NONE SEEN   Clue Cells Wet Prep HPF POC NONE SEEN  NONE SEEN   WBC, Wet Prep HPF POC FEW (*) NONE SEEN   No results found.    1. Urinary tract infection   2. STI (sexually transmitted infection)       MDM  Jessica Vasquez presents with abdominal pain and vomiting x1.  Low concern for appendicitis, but pt does have significant pain in the RLQ on palpation. My suspicion is low, but I will obtain an Abd CT to r/o appendicitis.   Pelvic exam with no adnexal masses; No cervical motion tenderness, no adnexal tenderness; moderate amount of discharge.  On Re-Exam patient has more suprapubic pain and less right lower quadrant pain.  Nausea and vomiting are under control this time.  Patient states feels much better after fluids.  Lipase normal urine pregnancy negative BMP and CMP within normal limits. UA with evidence of urinary tract infection. Wet prep with white blood cells seen.  Will treat for possible STI here in department and UTI on outpatient basis.  Patient states that she must leave to go home and feed her baby. She is refusing the CT scan AMA. She been treated with Rocephin and azithromycin. We'll send her home with Bactrim for urinary tract infection.  I discussed all of the risks of leaving without getting a CT scan, including the potential that she could have an appendicitis, that she could becomes sicker, or that she could have a life threatening infection.  I have given strict abdominal precautions.  I have also discussed reasons to return immediately  to the ER.  Patient expresses understanding and agrees with plan.  1. Medications: Bactrim 2. Treatment: Rest, drink plenty of fluids, take medications as prescribed 3. Follow Up: Primary care physician if symptoms persist       Dierdre Forth, PA-C 09/06/12 2038

## 2012-09-06 NOTE — ED Notes (Signed)
Pt reports dysuria x 2 days, states foul odor when she urinates.

## 2012-09-07 NOTE — ED Provider Notes (Signed)
Medical screening examination/treatment/procedure(s) were performed by non-physician practitioner and as supervising physician I was immediately available for consultation/collaboration.   Carleene Cooper III, MD 09/07/12 1330

## 2012-09-08 LAB — GC/CHLAMYDIA PROBE AMP, GENITAL
Chlamydia, DNA Probe: NEGATIVE
GC Probe Amp, Genital: NEGATIVE

## 2012-10-04 ENCOUNTER — Emergency Department (HOSPITAL_COMMUNITY)
Admission: EM | Admit: 2012-10-04 | Discharge: 2012-10-04 | Disposition: A | Discharge status: Home / Self Care | Payer: 59 | Attending: Emergency Medicine | Admitting: Emergency Medicine

## 2012-10-04 ENCOUNTER — Encounter (HOSPITAL_COMMUNITY): Payer: Self-pay | Admitting: Emergency Medicine

## 2012-10-04 DIAGNOSIS — R109 Unspecified abdominal pain: Secondary | ICD-10-CM | POA: Insufficient documentation

## 2012-10-04 LAB — URINALYSIS, ROUTINE W REFLEX MICROSCOPIC
Bilirubin Urine: NEGATIVE
Hgb urine dipstick: NEGATIVE
Ketones, ur: NEGATIVE mg/dL
Nitrite: NEGATIVE
Urobilinogen, UA: 0.2 mg/dL (ref 0.0–1.0)

## 2012-10-04 LAB — COMPREHENSIVE METABOLIC PANEL
Albumin: 3.9 g/dL (ref 3.5–5.2)
Alkaline Phosphatase: 76 U/L (ref 39–117)
BUN: 9 mg/dL (ref 6–23)
CO2: 27 mEq/L (ref 19–32)
Chloride: 102 mEq/L (ref 96–112)
Creatinine, Ser: 0.68 mg/dL (ref 0.50–1.10)
GFR calc Af Amer: >90 mL/min (ref >90)
GFR calc non Af Amer: >90 mL/min (ref >90)
Glucose, Bld: 76 mg/dL (ref 70–99)
Potassium: 3.8 mEq/L (ref 3.5–5.1)
Total Bilirubin: 0.2 mg/dL — ABNORMAL LOW (ref 0.3–1.2)

## 2012-10-04 LAB — CBC WITH DIFFERENTIAL/PLATELET
Basophils Relative: 1 % (ref 0–1)
HCT: 35.7 % — ABNORMAL LOW (ref 36.0–46.0)
Hemoglobin: 12.2 g/dL (ref 12.0–15.0)
Lymphocytes Relative: 41 % (ref 12–46)
Lymphs Abs: 2.7 10*3/uL (ref 0.7–4.0)
MCHC: 34.2 g/dL (ref 30.0–36.0)
Monocytes Absolute: 0.5 10*3/uL (ref 0.1–1.0)
Monocytes Relative: 8 % (ref 3–12)
Neutro Abs: 3 10*3/uL (ref 1.7–7.7)
Neutrophils Relative %: 47 % (ref 43–77)
RBC: 4.29 MIL/uL (ref 3.87–5.11)

## 2012-10-04 LAB — URINE MICROSCOPIC-ADD ON

## 2012-10-04 LAB — LIPASE, BLOOD: Lipase: 32 U/L (ref 11–59)

## 2012-10-04 LAB — WET PREP, GENITAL

## 2012-10-04 NOTE — ED Provider Notes (Signed)
History     CSN: [id number]  Arrival date & time 10/04/12  1415   First MD Initiated Contact with Patient 10/04/12 1620      Chief Complaint  Patient presents with  . Abdominal Pain    (Consider location/radiation/quality/duration/timing/severity/associated sxs/prior treatment) Patient is a 21 y.o. female presenting with abdominal pain. The history is provided by the patient.  Abdominal Pain The primary symptoms of the illness include abdominal pain. The primary symptoms of the illness do not include fever, nausea or vomiting.  Symptoms associated with the illness do not include chills. Associated symptoms comments: She presents for ongoing lower abdominal pain in the suprapubic area since the birth of her daughter 5 months ago. No abnormal vaginal bleeding, no discharge, no dysuria. It does not cause nausea. She also has a separate pain in the epigastric area that comes and goes. This pain has been going on for several weeks. It seems to be sometimes worse with eating. No N, V. No back pain. .    Past Medical History  Diagnosis Date  . No pertinent past medical history     Past Surgical History  Procedure Date  . Nasal sugery     No family history on file.  History  Substance Use Topics  . Smoking status: Former Smoker    Quit date: 05/16/2011  . Smokeless tobacco: Never Used  . Alcohol Use: No    OB History    Grav Para Term Preterm Abortions TAB SAB Ect Mult Living   1 1 1  0 0 0 0 0 0 1      Review of Systems  Constitutional: Negative for fever and chills.  HENT: Negative.   Respiratory: Negative.   Cardiovascular: Negative.   Gastrointestinal: Positive for abdominal pain. Negative for nausea and vomiting.  Musculoskeletal: Negative.   Skin: Negative.   Neurological: Negative.     Allergies  Review of patient's allergies indicates no known allergies.  Home Medications  No current outpatient prescriptions on file.  BP 115/63  Pulse 63  Temp 98.4 F  (36.9 C) (Oral)  Resp 20  SpO2 100%  LMP 08/31/2012  Breastfeeding? Yes  Physical Exam  Constitutional: She appears well-developed and well-nourished.  HENT:  Head: Normocephalic.  Neck: Normal range of motion. Neck supple.  Cardiovascular: Normal rate and regular rhythm.   Pulmonary/Chest: Effort normal and breath sounds normal.  Abdominal: Soft. Bowel sounds are normal. There is no tenderness. There is no rebound and no guarding.  Genitourinary: Vagina normal and uterus normal. No vaginal discharge found.       No adnexal mass or tenderness. No CMT.   Musculoskeletal: Normal range of motion.  Neurological: She is alert. No cranial nerve deficit.  Skin: Skin is warm and dry. No rash noted.  Psychiatric: She has a normal mood and affect.    ED Course  Procedures (including critical care time)  Labs Reviewed  CBC WITH DIFFERENTIAL - Abnormal; Notable for the following:    HCT 35.7 (*)     All other components within normal limits  COMPREHENSIVE METABOLIC PANEL - Abnormal; Notable for the following:    Total Bilirubin 0.2 (*)     All other components within normal limits  URINALYSIS, ROUTINE W REFLEX MICROSCOPIC - Abnormal; Notable for the following:    APPearance CLOUDY (*)     Leukocytes, UA SMALL (*)     All other components within normal limits  WET PREP, GENITAL - Abnormal; Notable for the following:  WBC, Wet Prep HPF POC FEW (*)     All other components within normal limits  URINE MICROSCOPIC-ADD ON - Abnormal; Notable for the following:    Squamous Epithelial / LPF MANY (*)     Bacteria, UA MANY (*)     All other components within normal limits  LIPASE, BLOOD  PREGNANCY, URINE  GC/CHLAMYDIA PROBE AMP, GENITAL   Results for orders placed during the hospital encounter of 10/04/12  CBC WITH DIFFERENTIAL      Component Value Range   WBC 6.5  4.0 - 10.5 K/uL   RBC 4.29  3.87 - 5.11 MIL/uL   Hemoglobin 12.2  12.0 - 15.0 g/dL   HCT 30.8 (*) 65.7 - 84.6 %   MCV  83.2  78.0 - 100.0 fL   MCH 28.4  26.0 - 34.0 pg   MCHC 34.2  30.0 - 36.0 g/dL   RDW 96.2  95.2 - 84.1 %   Platelets 237  150 - 400 K/uL   Neutrophils Relative 47  43 - 77 %   Neutro Abs 3.0  1.7 - 7.7 K/uL   Lymphocytes Relative 41  12 - 46 %   Lymphs Abs 2.7  0.7 - 4.0 K/uL   Monocytes Relative 8  3 - 12 %   Monocytes Absolute 0.5  0.1 - 1.0 K/uL   Eosinophils Relative 4  0 - 5 %   Eosinophils Absolute 0.2  0.0 - 0.7 K/uL   Basophils Relative 1  0 - 1 %   Basophils Absolute 0.0  0.0 - 0.1 K/uL  COMPREHENSIVE METABOLIC PANEL      Component Value Range   Sodium 138  135 - 145 mEq/L   Potassium 3.8  3.5 - 5.1 mEq/L   Chloride 102  96 - 112 mEq/L   CO2 27  19 - 32 mEq/L   Glucose, Bld 76  70 - 99 mg/dL   BUN 9  6 - 23 mg/dL   Creatinine, Ser 3.24  0.50 - 1.10 mg/dL   Calcium 9.4  8.4 - 40.1 mg/dL   Total Protein 7.1  6.0 - 8.3 g/dL   Albumin 3.9  3.5 - 5.2 g/dL   AST 22  0 - 37 U/L   ALT 24  0 - 35 U/L   Alkaline Phosphatase 76  39 - 117 U/L   Total Bilirubin 0.2 (*) 0.3 - 1.2 mg/dL   GFR calc non Af Amer >90  >90 mL/min   GFR calc Af Amer >90  >90 mL/min  LIPASE, BLOOD      Component Value Range   Lipase 32  11 - 59 U/L  URINALYSIS, ROUTINE W REFLEX MICROSCOPIC      Component Value Range   Color, Urine YELLOW  YELLOW   APPearance CLOUDY (*) CLEAR   Specific Gravity, Urine 1.028  1.005 - 1.030   pH 7.0  5.0 - 8.0   Glucose, UA NEGATIVE  NEGATIVE mg/dL   Hgb urine dipstick NEGATIVE  NEGATIVE   Bilirubin Urine NEGATIVE  NEGATIVE   Ketones, ur NEGATIVE  NEGATIVE mg/dL   Protein, ur NEGATIVE  NEGATIVE mg/dL   Urobilinogen, UA 0.2  0.0 - 1.0 mg/dL   Nitrite NEGATIVE  NEGATIVE   Leukocytes, UA SMALL (*) NEGATIVE  PREGNANCY, URINE      Component Value Range   Preg Test, Ur NEGATIVE  NEGATIVE  WET PREP, GENITAL      Component Value Range   Yeast Wet Prep HPF POC  NONE SEEN  NONE SEEN   Trich, Wet Prep NONE SEEN  NONE SEEN   Clue Cells Wet Prep HPF POC NONE SEEN  NONE  SEEN   WBC, Wet Prep HPF POC FEW (*) NONE SEEN  URINE MICROSCOPIC-ADD ON      Component Value Range   Squamous Epithelial / LPF MANY (*) RARE   WBC, UA 3-6  <3 WBC/hpf   Bacteria, UA MANY (*) RARE    No results found.   No diagnosis found.  1. Abdominal pain   MDM  Longstanding symptoms in a patient who presents pain free at the current time. Normal labs today. She does not have evidence of infection or inflammation. Discharge home to follow up with PCP.        Rodena Medin, PA-C 10/04/12 1950

## 2012-10-04 NOTE — ED Notes (Signed)
Pt presents w/ one month hx of abd pain, was here one month ago for same pain but had to leave, did not stay to have CT completed so wants to have her appendix checked. Denies fever, N/V/D, and urinary Sx

## 2012-10-04 NOTE — ED Notes (Signed)
Pt. Is not able to use the restroom at this time. She is aware that we need urine and the urine cup is in her room.

## 2012-10-04 NOTE — ED Notes (Signed)
Pt with occasional upper abdominal pain for one month.  Worse after eating.  Pt had one episode of vomiting last night.  Last BM this AM which was normal.  Patient also reporting abscess under both arms for one month. Abscess draining yellowish fluid.  Pt is requesting pregnancy test.  Has not had period since Sept.

## 2012-10-06 LAB — GC/CHLAMYDIA PROBE AMP, GENITAL
Chlamydia, DNA Probe: NEGATIVE
GC Probe Amp, Genital: NEGATIVE

## 2012-10-06 NOTE — ED Provider Notes (Signed)
Medical screening examination/treatment/procedure(s) were performed by non-physician practitioner and as supervising physician I was immediately available for consultation/collaboration.  Stokely Jeancharles R. Shaquna Geigle, MD 10/06/12 0012 

## 2012-12-24 ENCOUNTER — Emergency Department (HOSPITAL_COMMUNITY)
Admission: EM | Admit: 2012-12-24 | Discharge: 2012-12-24 | Disposition: A | Discharge status: Home Health | Payer: 59 | Attending: Emergency Medicine | Admitting: Emergency Medicine

## 2012-12-24 ENCOUNTER — Emergency Department (HOSPITAL_COMMUNITY): Payer: 59

## 2012-12-24 ENCOUNTER — Encounter (HOSPITAL_COMMUNITY): Payer: Self-pay | Admitting: Emergency Medicine

## 2012-12-24 DIAGNOSIS — Z87891 Personal history of nicotine dependence: Secondary | ICD-10-CM | POA: Insufficient documentation

## 2012-12-24 DIAGNOSIS — R7401 Elevation of levels of liver transaminase levels: Secondary | ICD-10-CM | POA: Insufficient documentation

## 2012-12-24 DIAGNOSIS — R7402 Elevation of levels of lactic acid dehydrogenase (LDH): Secondary | ICD-10-CM | POA: Insufficient documentation

## 2012-12-24 DIAGNOSIS — K8021 Calculus of gallbladder without cholecystitis with obstruction: Secondary | ICD-10-CM | POA: Insufficient documentation

## 2012-12-24 DIAGNOSIS — Z3202 Encounter for pregnancy test, result negative: Secondary | ICD-10-CM | POA: Insufficient documentation

## 2012-12-24 DIAGNOSIS — K8050 Calculus of bile duct without cholangitis or cholecystitis without obstruction: Secondary | ICD-10-CM

## 2012-12-24 LAB — URINE MICROSCOPIC-ADD ON

## 2012-12-24 LAB — COMPREHENSIVE METABOLIC PANEL
AST: 514 U/L — ABNORMAL HIGH (ref 0–37)
Albumin: 4.1 g/dL (ref 3.5–5.2)
BUN: 12 mg/dL (ref 6–23)
Chloride: 101 mEq/L (ref 96–112)
Creatinine, Ser: 0.77 mg/dL (ref 0.50–1.10)
Total Bilirubin: 0.7 mg/dL (ref 0.3–1.2)
Total Protein: 7.5 g/dL (ref 6.0–8.3)

## 2012-12-24 LAB — URINALYSIS, ROUTINE W REFLEX MICROSCOPIC
Glucose, UA: NEGATIVE mg/dL
Ketones, ur: NEGATIVE mg/dL
Leukocytes, UA: NEGATIVE
Nitrite: NEGATIVE
Specific Gravity, Urine: 1.01 (ref 1.005–1.030)
pH: 7.5 (ref 5.0–8.0)

## 2012-12-24 LAB — CBC WITH DIFFERENTIAL/PLATELET
Basophils Absolute: 0 10*3/uL (ref 0.0–0.1)
Basophils Relative: 0 % (ref 0–1)
Eosinophils Absolute: 0.1 10*3/uL (ref 0.0–0.7)
HCT: 36.5 % (ref 36.0–46.0)
MCH: 28.4 pg (ref 26.0–34.0)
MCHC: 34.2 g/dL (ref 30.0–36.0)
Monocytes Absolute: 0.6 10*3/uL (ref 0.1–1.0)
Neutro Abs: 3.3 10*3/uL (ref 1.7–7.7)
Neutrophils Relative %: 48 % (ref 43–77)
RDW: 12.7 % (ref 11.5–15.5)

## 2012-12-24 LAB — LIPASE, BLOOD: Lipase: 46 U/L (ref 11–59)

## 2012-12-24 LAB — POCT PREGNANCY, URINE: Preg Test, Ur: NEGATIVE

## 2012-12-24 MED ORDER — NAPROXEN 500 MG PO TABS: 500.0000 mg | ORAL_TABLET | Freq: Two times a day (BID) | ORAL | Status: DC

## 2012-12-24 MED ORDER — HYDROCODONE-ACETAMINOPHEN 5-325 MG PO TABS: 2.0000 | ORAL_TABLET | ORAL | Status: DC | PRN

## 2012-12-24 MED ORDER — ONDANSETRON 4 MG PO TBDP: 4.0000 mg | ORAL_TABLET | Freq: Three times a day (TID) | ORAL | Status: DC | PRN

## 2012-12-24 NOTE — ED Notes (Signed)
States she has had abd pain x 4 months it is sometimes worse after eating.

## 2012-12-24 NOTE — ED Notes (Signed)
Returned from U/S

## 2012-12-24 NOTE — ED Notes (Signed)
Pt states that she is feeling better.

## 2012-12-24 NOTE — ED Notes (Signed)
Patient transported to Ultrasound 

## 2012-12-24 NOTE — ED Provider Notes (Signed)
History     CSN: [id number]  Arrival date & time 12/24/12  0159   First MD Initiated Contact with Patient 12/24/12 [phone]      Chief Complaint  Patient presents with  . Abdominal Pain    (Consider location/radiation/quality/duration/timing/severity/associated sxs/prior treatment) HPI Comments: 22 year old female presents with approximately 4 months of intermittent supraumbilical and right upper quadrant abdominal pain. She states that this lasts from minutes to hours at a time, it lasted 3 hours last night and then again tonight it lasted several hours as well. She states that the pain is mild at this time, it seems to get worse after eating and it is not associated with fevers, chills, coughing or shortness of breath. She denies any history of surgical abdominal problems, she has been using occasional Tylenol less than 2 g over the last 2 days and does not drink alcohol. She has not been traveling, has not had any gastrointestinal illnesses or been around anybody with the same. She has been evaluated several times in the past several months for the same complaints, no etiology was identified at that time and she was referred to gastroenterology as an outpatient. She did not followup  Patient is a 22 y.o. female presenting with abdominal pain. The history is provided by the patient.  Abdominal Pain The primary symptoms of the illness include abdominal pain.    Past Medical History  Diagnosis Date  . No pertinent past medical history     Past Surgical History  Procedure Date  . Nasal sugery     History reviewed. No pertinent family history.  History  Substance Use Topics  . Smoking status: Former Smoker    Quit date: 05/16/2011  . Smokeless tobacco: Never Used  . Alcohol Use: No    OB History    Grav Para Term Preterm Abortions TAB SAB Ect Mult Living   1 1 1  0 0 0 0 0 0 1      Review of Systems  Gastrointestinal: Positive for abdominal pain.  All other systems reviewed  and are negative.    Allergies  Review of patient's allergies indicates no known allergies.  Home Medications   Current Outpatient Rx  Name  Route  Sig  Dispense  Refill  . HYDROCODONE-ACETAMINOPHEN 5-325 MG PO TABS   Oral   Take 2 tablets by mouth every 4 (four) hours as needed for pain.   10 tablet   0   . NAPROXEN 500 MG PO TABS   Oral   Take 1 tablet (500 mg total) by mouth 2 (two) times daily with a meal.   30 tablet   0   . ONDANSETRON 4 MG PO TBDP   Oral   Take 1 tablet (4 mg total) by mouth every 8 (eight) hours as needed for nausea.   10 tablet   0     BP 132/81  Pulse 70  Temp 98 F (36.7 C) (Oral)  Resp 18  SpO2 100%  Physical Exam  Nursing note and vitals reviewed. Constitutional: She appears well-developed and well-nourished. No distress.  HENT:  Head: Normocephalic and atraumatic.  Mouth/Throat: Oropharynx is clear and moist. No oropharyngeal exudate.  Eyes: Conjunctivae normal and EOM are normal. Pupils are equal, round, and reactive to light. Right eye exhibits no discharge. Left eye exhibits no discharge. No scleral icterus.  Neck: Normal range of motion. Neck supple. No JVD present. No thyromegaly present.  Cardiovascular: Normal rate, regular rhythm, normal heart sounds and intact  distal pulses.  Exam reveals no gallop and no friction rub.   No murmur heard. Pulmonary/Chest: Effort normal and breath sounds normal. No respiratory distress. She has no wheezes. She has no rales.  Abdominal: Soft. Bowel sounds are normal. She exhibits no distension and no mass. There is tenderness ( Minimal epigastric and right upper quadrant tenderness, no guarding, no Murphy sign, no pain in her lower abdomen, no peritoneal signs).       No tympanitic sounds to percussion  Musculoskeletal: Normal range of motion. She exhibits no edema and no tenderness.  Lymphadenopathy:    She has no cervical adenopathy.  Neurological: She is alert. Coordination normal.  Skin:  Skin is warm and dry. No rash noted. No erythema.  Psychiatric: She has a normal mood and affect. Her behavior is normal.    ED Course  Procedures (including critical care time)  Labs Reviewed  COMPREHENSIVE METABOLIC PANEL - Abnormal; Notable for the following:    Glucose, Bld 113 (*)     AST 514 (*)     ALT 263 (*)     Alkaline Phosphatase 120 (*)     All other components within normal limits  URINALYSIS, ROUTINE W REFLEX MICROSCOPIC - Abnormal; Notable for the following:    APPearance TURBID (*)     Protein, ur 30 (*)     All other components within normal limits  URINE MICROSCOPIC-ADD ON - Abnormal; Notable for the following:    Squamous Epithelial / LPF MANY (*)     Bacteria, UA FEW (*)     All other components within normal limits  CBC WITH DIFFERENTIAL  LIPASE, BLOOD  POCT PREGNANCY, URINE  URINE CULTURE   US Abdomen Complete  12/24/2012  *RADIOLOGY REPORT*  Clinical Data:  Right upper quadrant pain.  COMPLETE ABDOMINAL ULTRASOUND  Comparison:  None.  Findings:  Gallbladder:  Several small stones are seen within the gallbladder measuring up to 0.9 cm.  One of the stones appears impacted in the neck of the gallbladder.  Gallbladder wall thickness is mildly increased at 0.4 cm.  No pericholecystic fluid is identified. Sonographer reports negative Murphy's sign.  Common bile duct:  Measures 0.5 cm.  Liver:  No focal lesion identified.  Within normal limits in parenchymal echogenicity.  IVC:  Appears normal.  Pancreas:  No focal abnormality seen.  Spleen:  Measures 4.7 cm and appears normal.  Right Kidney:  Measures 10.5 cm and appears normal.  Left Kidney:  Measures 10.2 cm head appears normal.  Abdominal aorta:  No aneurysm identified.  IMPRESSION: Multiple small gallstones with a stone appearing impacted in the gallbladder neck.  Gallbladder wall is mildly thickened but there is no pericholecystic fluid and the sonographer reports a negative Murphy's sign.   Original Report  Authenticated By: Holley Dexter, M.D.      1. Biliary colic   2. Transaminitis       MDM  Overall the patient is well-appearing, normal vital signs, minimal if any tenderness on exam, she does have elevated liver function tests consistent with transaminitis. I do not think this is acetaminophen related nor is it alcohol related, given the subacute nature of her symptoms we'll obtain an ultrasound to evaluate for chronic cholecystitis versus biliary obstruction area and there is no elevation in her bilirubin or jaundice to suggest a significant acute process and the patient declines pain or nausea medications at this time.   I have discussed the patient care with Dr. Derrell Lolling of General Surgery  who agrees that if the patient is still pain-free she can be discharged to followup in the clinic. I have reevaluated her and she has no pain at this time. I have informed her of her ultrasound results and she has agreed to followup in the clinic. The patient appears stable for discharge at this time.   Discharge Prescriptions include:  Naprosyn  Hydrocodone  Zofran     Vida Roller, MD 12/24/12 [phone]

## 2012-12-24 NOTE — ED Notes (Signed)
Was seen here before but "left before the CT scan" wants to have test done to find out what is wrong

## 2012-12-24 NOTE — ED Notes (Signed)
Patient reporting upper left abdominal pain; reports that it has been going on for over four months.  Was seen twice for the same symptoms; once patient left AMA, the second patient was instructed to follow up with gastroenterologist for unknown cause of abdominal pain.  Reports nausea and vomiting; two episodes of vomiting in last 24 hours.

## 2012-12-25 ENCOUNTER — Ambulatory Visit (INDEPENDENT_AMBULATORY_CARE_PROVIDER_SITE_OTHER): Payer: Self-pay | Admitting: General Surgery

## 2012-12-25 LAB — URINE CULTURE: Colony Count: 40000

## 2012-12-29 ENCOUNTER — Ambulatory Visit (INDEPENDENT_AMBULATORY_CARE_PROVIDER_SITE_OTHER): Payer: 59 | Admitting: Surgery

## 2012-12-29 ENCOUNTER — Encounter (INDEPENDENT_AMBULATORY_CARE_PROVIDER_SITE_OTHER): Payer: Self-pay | Admitting: Surgery

## 2012-12-29 VITALS — BP 142/80 | HR 76 | Temp 97.0°F | Resp 18 | Ht 69.0 in | Wt 220.0 lb

## 2012-12-29 DIAGNOSIS — K8010 Calculus of gallbladder with chronic cholecystitis without obstruction: Secondary | ICD-10-CM

## 2012-12-29 NOTE — Progress Notes (Signed)
Patient ID: Jessica Vasquez, female   DOB: 07-22-1991, 22 y.o.   MRN: [id number]  Chief Complaint  Patient presents with  . New Evaluation    G.B    HPI Jessica Vasquez is a 22 y.o. female.  Referred by Dr. Eber Hong for evaluation of gallbladder disease HPI This is a 22 year old female who presents with several months of intermittent right upper quadrant abdominal pain radiating through to her back. Associated with bloating, nausea, and vomiting. She does report intermittent diarrhea. She has had 3 ER visits for this problem. At the most recent visit date 10 ultrasound which showed multiple gallstones with some mild wall thickening. White blood cell count was normal. Liver function tests were mildly abnormal. She is now referred for surgical evaluation. She is intermittently symptomatic and is maintaining a very low-fat diet.   Past Medical History  Diagnosis Date  . No pertinent past medical history     Past Surgical History  Procedure Date  . Nasal sugery     Family History  Problem Relation Age of Onset  . Gallstones Mother     Social History History  Substance Use Topics  . Smoking status: Former Smoker    Quit date: 05/16/2011  . Smokeless tobacco: Never Used  . Alcohol Use: No    No Known Allergies  Current Outpatient Prescriptions  Medication Sig Dispense Refill  . HYDROcodone-acetaminophen (NORCO/VICODIN) 5-325 MG per tablet Take 2 tablets by mouth every 4 (four) hours as needed for pain.  10 tablet  0  . naproxen (NAPROSYN) 500 MG tablet Take 1 tablet (500 mg total) by mouth 2 (two) times daily with a meal.  30 tablet  0  . ondansetron (ZOFRAN ODT) 4 MG disintegrating tablet Take 1 tablet (4 mg total) by mouth every 8 (eight) hours as needed for nausea.  10 tablet  0    Review of Systems Review of Systems  Constitutional: Negative for fever, chills and unexpected weight change.  HENT: Negative for hearing loss, congestion, sore throat, trouble swallowing and  voice change.   Eyes: Negative for visual disturbance.  Respiratory: Negative for cough and wheezing.   Cardiovascular: Negative for chest pain, palpitations and leg swelling.  Gastrointestinal: Positive for nausea, vomiting, abdominal pain, diarrhea and abdominal distention. Negative for constipation, blood in stool and anal bleeding.  Genitourinary: Negative for hematuria, vaginal bleeding and difficulty urinating.  Musculoskeletal: Negative for arthralgias.  Skin: Negative for rash and wound.  Neurological: Negative for seizures, syncope and headaches.  Hematological: Negative for adenopathy. Does not bruise/bleed easily.  Psychiatric/Behavioral: Negative for confusion.    Blood pressure 142/80, pulse 76, temperature 97 F (36.1 C), resp. rate 18, height 5\' 9"  (1.753 m), weight 220 lb (99.791 kg).  Physical Exam Physical Exam WDWN in NAD HEENT:  EOMI, sclera anicteric Neck:  No masses, no thyromegaly Lungs:  CTA bilaterally; normal respiratory effort CV:  Regular rate and rhythm; no murmurs Abd:  +bowel sounds, soft, mild RUQ tenderness, no masses Ext:  Well-perfused; no edema Skin:  Warm, dry; no sign of jaundice  Data Reviewed Lab Results  Component Value Date   WBC 6.9 12/24/2012   HGB 12.5 12/24/2012   HCT 36.5 12/24/2012   MCV 83.0 12/24/2012   PLT 233 12/24/2012   Lab Results  Component Value Date   CREATININE 0.77 12/24/2012   BUN 12 12/24/2012   NA 137 12/24/2012   K 3.6 12/24/2012   CL 101 12/24/2012   CO2 22 12/24/2012  Lab Results  Component Value Date   ALT 263* 12/24/2012   AST 514* 12/24/2012   ALKPHOS 120* 12/24/2012   BILITOT 0.7 12/24/2012   Lab Results  Component Value Date   PREGTESTUR NEGATIVE 12/24/2012   Clinical Data: Right upper quadrant pain.  COMPLETE ABDOMINAL ULTRASOUND  Comparison: None.  Findings:  Gallbladder: Several small stones are seen within the gallbladder  measuring up to 0.9 cm. One of the stones appears impacted in the  neck of the  gallbladder. Gallbladder wall thickness is mildly  increased at 0.4 cm. No pericholecystic fluid is identified.  Sonographer reports negative Murphy's sign.  Common bile duct: Measures 0.5 cm.  Liver: No focal lesion identified. Within normal limits in  parenchymal echogenicity.  IVC: Appears normal.  Pancreas: No focal abnormality seen.  Spleen: Measures 4.7 cm and appears normal.  Right Kidney: Measures 10.5 cm and appears normal.  Left Kidney: Measures 10.2 cm head appears normal.  Abdominal aorta: No aneurysm identified.  IMPRESSION:  Multiple small gallstones with a stone appearing impacted in the  gallbladder neck. Gallbladder wall is mildly thickened but there  is no pericholecystic fluid and the sonographer reports a negative  Murphy's sign.  Original Report Authenticated By: Jessica Vasquez, M.D.    Assessment    Chronic calculus cholecystitis    Plan    Laparoscopic cholecystectomy with intraoperative cholangiogram. The surgical procedure has been discussed with the patient.  Potential risks, benefits, alternative treatments, and expected outcomes have been explained.  All of the patient's questions at this time have been answered.  The likelihood of reaching the patient's treatment goal is good.  The patient understand the proposed surgical procedure and wishes to proceed.        Jessica Vasquez K. 12/29/2012, 4:46 PM

## 2013-05-08 ENCOUNTER — Ambulatory Visit (INDEPENDENT_AMBULATORY_CARE_PROVIDER_SITE_OTHER): Payer: 59 | Admitting: Surgery

## 2013-05-30 ENCOUNTER — Emergency Department (HOSPITAL_COMMUNITY): Payer: 59

## 2013-05-30 ENCOUNTER — Encounter (HOSPITAL_COMMUNITY): Payer: Self-pay | Admitting: Nurse Practitioner

## 2013-05-30 ENCOUNTER — Emergency Department (HOSPITAL_COMMUNITY)
Admission: EM | Admit: 2013-05-30 | Discharge: 2013-05-30 | Disposition: A | Discharge status: Home / Self Care | Payer: 59 | Attending: Emergency Medicine | Admitting: Emergency Medicine

## 2013-05-30 DIAGNOSIS — S022XXA Fracture of nasal bones, initial encounter for closed fracture: Secondary | ICD-10-CM

## 2013-05-30 DIAGNOSIS — IMO0002 Reserved for concepts with insufficient information to code with codable children: Secondary | ICD-10-CM | POA: Insufficient documentation

## 2013-05-30 MED ORDER — HYDROCODONE-ACETAMINOPHEN 5-325 MG PO TABS: 2.0000 | ORAL_TABLET | Freq: Four times a day (QID) | ORAL | Status: DC | PRN

## 2013-05-30 MED ORDER — PROMETHAZINE HCL 25 MG PO TABS: 25.0000 mg | ORAL_TABLET | Freq: Four times a day (QID) | ORAL | Status: DC | PRN

## 2013-05-30 MED ORDER — ACETAMINOPHEN 500 MG PO TABS
1000.0000 mg | ORAL_TABLET | Freq: Once | ORAL | Status: AC
  Administered 2013-05-30: 1000 mg via ORAL
  Filled 2013-05-30: qty 2

## 2013-05-30 NOTE — ED Notes (Signed)
Pt returned from xray and CT. No signs of distress.

## 2013-05-30 NOTE — ED Notes (Signed)
PT ambulated with baseline gait; VSS; A&Ox3; no signs of distress; respirations even and unlabored; skin warm and dry; no questions upon discharge.  
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
## 2013-05-30 NOTE — ED Notes (Signed)
Patient transported to CT 
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
## 2013-06-01 NOTE — ED Provider Notes (Signed)
Medical screening examination/treatment/procedure(s) were performed by non-physician practitioner and as supervising physician I was immediately available for consultation/collaboration.   Shakita Keir, MD 06/01/13 1117 

## 2013-06-09 ENCOUNTER — Ambulatory Visit (INDEPENDENT_AMBULATORY_CARE_PROVIDER_SITE_OTHER): Payer: 59 | Admitting: Surgery

## 2013-06-25 ENCOUNTER — Encounter (INDEPENDENT_AMBULATORY_CARE_PROVIDER_SITE_OTHER): Payer: Self-pay | Admitting: Surgery

## 2013-08-10 ENCOUNTER — Telehealth: Payer: Self-pay | Admitting: *Deleted

## 2013-08-10 DIAGNOSIS — B9689 Other specified bacterial agents as the cause of diseases classified elsewhere: Secondary | ICD-10-CM

## 2013-08-10 MED ORDER — METRONIDAZOLE 500 MG PO TABS: 500.0000 mg | ORAL_TABLET | Freq: Two times a day (BID) | ORAL | Status: DC

## 2013-08-10 NOTE — Telephone Encounter (Signed)
Patient request refill of Flagyl. Ok per Dr Clearance Coots.

## 2013-08-26 ENCOUNTER — Inpatient Hospital Stay (HOSPITAL_COMMUNITY)
Admission: AD | Admit: 2013-08-26 | Discharge: 2013-08-26 | Disposition: A | Discharge status: Home / Self Care | Payer: 59 | Source: Ambulatory Visit | Attending: Obstetrics & Gynecology | Admitting: Obstetrics & Gynecology

## 2013-08-26 ENCOUNTER — Encounter (HOSPITAL_COMMUNITY): Payer: Self-pay

## 2013-08-26 ENCOUNTER — Inpatient Hospital Stay (HOSPITAL_COMMUNITY): Payer: 59

## 2013-08-26 DIAGNOSIS — O9989 Other specified diseases and conditions complicating pregnancy, childbirth and the puerperium: Secondary | ICD-10-CM

## 2013-08-26 DIAGNOSIS — B373 Candidiasis of vulva and vagina: Secondary | ICD-10-CM | POA: Insufficient documentation

## 2013-08-26 DIAGNOSIS — R109 Unspecified abdominal pain: Secondary | ICD-10-CM | POA: Insufficient documentation

## 2013-08-26 DIAGNOSIS — O26899 Other specified pregnancy related conditions, unspecified trimester: Secondary | ICD-10-CM

## 2013-08-26 DIAGNOSIS — O2390 Unspecified genitourinary tract infection in pregnancy, unspecified trimester: Secondary | ICD-10-CM | POA: Insufficient documentation

## 2013-08-26 DIAGNOSIS — B3731 Acute candidiasis of vulva and vagina: Secondary | ICD-10-CM | POA: Insufficient documentation

## 2013-08-26 DIAGNOSIS — N949 Unspecified condition associated with female genital organs and menstrual cycle: Secondary | ICD-10-CM | POA: Insufficient documentation

## 2013-08-26 LAB — URINALYSIS, ROUTINE W REFLEX MICROSCOPIC
Bilirubin Urine: NEGATIVE
Glucose, UA: NEGATIVE mg/dL
Hgb urine dipstick: NEGATIVE
Protein, ur: NEGATIVE mg/dL
Specific Gravity, Urine: >1.03 — ABNORMAL HIGH (ref 1.005–1.030)

## 2013-08-26 LAB — CBC
HCT: 31.8 % — ABNORMAL LOW (ref 36.0–46.0)
MCV: 86.2 fL (ref 78.0–100.0)
RBC: 3.69 MIL/uL — ABNORMAL LOW (ref 3.87–5.11)
WBC: 8.6 10*3/uL (ref 4.0–10.5)

## 2013-08-26 LAB — URINE MICROSCOPIC-ADD ON

## 2013-08-26 LAB — POCT PREGNANCY, URINE: Preg Test, Ur: POSITIVE — AB

## 2013-08-26 LAB — WET PREP, GENITAL: Yeast Wet Prep HPF POC: NONE SEEN

## 2013-08-26 MED ORDER — NYSTATIN 100000 UNIT/GM EX CREA: TOPICAL_CREAM | CUTANEOUS | Status: DC

## 2013-08-26 NOTE — MAU Note (Signed)
Pt reports she is having pains in her lower stomach for about 3 days, LMP 06/16/2013

## 2013-08-26 NOTE — MAU Provider Note (Signed)
History     CSN: [id number]  Arrival date and time: 08/26/13 4098   First Provider Initiated Contact with Patient 08/26/13 2043      Chief Complaint  Patient presents with  . Abdominal Pain  . Possible Pregnancy   HPI [name] is a 22 y.o. G2P1001 at [redacted]w[redacted]d who presents to MAU today with complaint of lower abdominal pain x 3 days. The patient states pain is 5/10 now and 10/10 at the worst. She denies vaginal bleeding, UTI symptoms or fever. She has had some external itching and thick, white, odorless vaginal discharge. She denies N/V/D.   OB History   Grav Para Term Preterm Abortions TAB SAB Ect Mult Living   2 1 1  0 0 0 0 0 0 1      Past Medical History  Diagnosis Date  . No pertinent past medical history     Past Surgical History  Procedure Laterality Date  . Nasal sugery      Family History  Problem Relation Age of Onset  . Gallstones Mother     History  Substance Use Topics  . Smoking status: Former Smoker    Quit date: 05/16/2011  . Smokeless tobacco: Never Used  . Alcohol Use: No    Allergies: No Known Allergies  Prescriptions prior to admission  Medication Sig Dispense Refill  . metroNIDAZOLE (FLAGYL) 500 MG tablet Take 1 tablet (500 mg total) by mouth 2 (two) times daily.  14 tablet  0  . [DISCONTINUED] HYDROcodone-acetaminophen (NORCO/VICODIN) 5-325 MG per tablet Take 2 tablets by mouth every 6 (six) hours as needed for pain.  6 tablet  0  . [DISCONTINUED] promethazine (PHENERGAN) 25 MG tablet Take 1 tablet (25 mg total) by mouth every 6 (six) hours as needed for nausea.  12 tablet  0    Review of Systems  Constitutional: Negative for fever and malaise/fatigue.  Gastrointestinal: Positive for abdominal pain. Negative for nausea, vomiting, diarrhea and constipation.  Genitourinary: Negative for dysuria, urgency and frequency.       Neg - vaginal bleeding, discharge  Neurological: Negative for dizziness, loss of consciousness and weakness.    Physical Exam   Blood pressure 123/47, pulse 84, temperature 98 F (36.7 C), temperature source Oral, resp. rate 20, height 5\' 9"  (1.753 m), weight 212 lb (96.163 kg), last menstrual period 06/16/2013, SpO2 100.00%.  Physical Exam  Constitutional: She is oriented to person, place, and time. She appears well-developed and well-nourished. No distress.  HENT:  Head: Normocephalic and atraumatic.  Cardiovascular: Normal rate.   Respiratory: Effort normal.  GI: Soft. She exhibits no distension and no mass. There is tenderness (mild tenderness to palpation of the lower abdomen more prominent in the LLQ). There is no rebound and no guarding.  Genitourinary: There is rash (small area of raised erythema at the base of the labia near the perineum) on the right labia. There is rash on the left labia.  Neurological: She is alert and oriented to person, place, and time.  Skin: Skin is warm and dry. No erythema.  Psychiatric: She has a normal mood and affect.   Results for orders placed during the hospital encounter of 08/26/13 (from the past 24 hour(s))  URINALYSIS, ROUTINE W REFLEX MICROSCOPIC     Status: Abnormal   Collection Time    08/26/13  7:54 PM      Result Value Range   Color, Urine YELLOW  YELLOW   APPearance CLEAR  CLEAR   Specific  Gravity, Urine >1.030 (*) 1.005 - 1.030   pH 6.0  5.0 - 8.0   Glucose, UA NEGATIVE  NEGATIVE mg/dL   Hgb urine dipstick NEGATIVE  NEGATIVE   Bilirubin Urine NEGATIVE  NEGATIVE   Ketones, ur NEGATIVE  NEGATIVE mg/dL   Protein, ur NEGATIVE  NEGATIVE mg/dL   Urobilinogen, UA 0.2  0.0 - 1.0 mg/dL   Nitrite NEGATIVE  NEGATIVE   Leukocytes, UA MODERATE (*) NEGATIVE  URINE MICROSCOPIC-ADD ON     Status: Abnormal   Collection Time    08/26/13  7:54 PM      Result Value Range   Squamous Epithelial / LPF FEW (*) RARE   WBC, UA 0-2  <3 WBC/hpf   RBC / HPF 3-6  <3 RBC/hpf   Bacteria, UA FEW (*) RARE  POCT PREGNANCY, URINE     Status: Abnormal   Collection  Time    08/26/13  8:06 PM      Result Value Range   Preg Test, Ur POSITIVE (*) NEGATIVE  WET PREP, GENITAL     Status: Abnormal   Collection Time    08/26/13  8:50 PM      Result Value Range   Yeast Wet Prep HPF POC NONE SEEN  NONE SEEN   Trich, Wet Prep NONE SEEN  NONE SEEN   Clue Cells Wet Prep HPF POC NONE SEEN  NONE SEEN   WBC, Wet Prep HPF POC MODERATE (*) NONE SEEN  CBC     Status: Abnormal   Collection Time    08/26/13  8:58 PM      Result Value Range   WBC 8.6  4.0 - 10.5 K/uL   RBC 3.69 (*) 3.87 - 5.11 MIL/uL   Hemoglobin 10.6 (*) 12.0 - 15.0 g/dL   HCT 16.1 (*) 09.6 - 04.5 %   MCV 86.2  78.0 - 100.0 fL   MCH 28.7  26.0 - 34.0 pg   MCHC 33.3  30.0 - 36.0 g/dL   RDW 40.9  81.1 - 91.4 %   Platelets 230  150 - 400 K/uL   US Ob Comp Less 14 Wks  08/26/2013   CLINICAL DATA:  Pelvic pain.  EXAM: OBSTETRIC <14 WK [place] AND TRANSVAGINAL OB US  TECHNIQUE: Both transabdominal and transvaginal ultrasound examinations were performed for complete evaluation of the gestation as well as the maternal uterus, adnexal regions, and pelvic cul-de-sac. Transvaginal technique was performed to assess early pregnancy.  COMPARISON:  None.  FINDINGS: Intrauterine gestational sac: Visualized/normal in shape.  Yolk sac:  Present  Embryo:  Not visualized  Cardiac Activity: N/A  Heart Rate:  N/A bpm  MSD:  4.4  mm   5 w   0  d  CRL:    mm    w  d                  [place] EDC: 04/28/2014  Maternal uterus/adnexae: No subchorionic hemorrhage. Ovaries are symmetric in size and echotexture. No adnexal masses. Trace free fluid.  IMPRESSION: Early intrauterine pregnancy with yolk sac present. Currently, no fetal pole visualized. Estimated gestational age of [redacted] weeks 0 days.   Electronically Signed   By: Charlett Nose M.D.   On: 08/26/2013 21:24   US Ob Transvaginal  08/26/2013   CLINICAL DATA:  Pelvic pain.  EXAM: OBSTETRIC <14 WK [place] AND TRANSVAGINAL OB US  TECHNIQUE: Both transabdominal and transvaginal ultrasound  examinations were performed for complete evaluation of the gestation as  well as the maternal uterus, adnexal regions, and pelvic cul-de-sac. Transvaginal technique was performed to assess early pregnancy.  COMPARISON:  None.  FINDINGS: Intrauterine gestational sac: Visualized/normal in shape.  Yolk sac:  Present  Embryo:  Not visualized  Cardiac Activity: N/A  Heart Rate:  N/A bpm  MSD:  4.4  mm   5 w   0  d  CRL:    mm    w  d                  [place] EDC: 04/28/2014  Maternal uterus/adnexae: No subchorionic hemorrhage. Ovaries are symmetric in size and echotexture. No adnexal masses. Trace free fluid.  IMPRESSION: Early intrauterine pregnancy with yolk sac present. Currently, no fetal pole visualized. Estimated gestational age of [redacted] weeks 0 days.   Electronically Signed   By: Charlett Nose M.D.   On: 08/26/2013 21:24    MAU Course  Procedures None  MDM +UPT Unable to confirm FHR with doppler UA, Wet prep, GC/Chlamdyia, CBC, quant hCG and [place] today  Assessment and Plan  A: IUGS and YS at [redacted]w[redacted]d Cutaneous yeast  P: Discharge home Rx for Nystatin cream sent to patient's pharmacy First trimester warning signs reviewed Follow-up with Clearance Coots as scheduled to start prenatal care Patient may return to MAU as needed  Freddi Starr, PA-C  08/26/2013, 9:28 PM

## 2013-08-26 NOTE — MAU Note (Signed)
Patient reports cramping pain in lower abdomen starting 3 days ago, rates 5/10. Patient reports white vaginal discharge starting 3 weeks ago. She reports that she took flagyl prior to the discharge. Patient denies vaginal bleeding.

## 2013-08-29 ENCOUNTER — Emergency Department (HOSPITAL_COMMUNITY)
Admission: EM | Admit: 2013-08-29 | Discharge: 2013-08-29 | Disposition: A | Discharge status: Home / Self Care | Payer: 59 | Attending: Emergency Medicine | Admitting: Emergency Medicine

## 2013-08-29 ENCOUNTER — Encounter (HOSPITAL_COMMUNITY): Payer: Self-pay

## 2013-08-29 DIAGNOSIS — N9489 Other specified conditions associated with female genital organs and menstrual cycle: Secondary | ICD-10-CM | POA: Insufficient documentation

## 2013-08-29 DIAGNOSIS — N766 Ulceration of vulva: Secondary | ICD-10-CM

## 2013-08-29 DIAGNOSIS — N898 Other specified noninflammatory disorders of vagina: Secondary | ICD-10-CM | POA: Insufficient documentation

## 2013-08-29 DIAGNOSIS — Z87891 Personal history of nicotine dependence: Secondary | ICD-10-CM | POA: Insufficient documentation

## 2013-08-29 DIAGNOSIS — O9989 Other specified diseases and conditions complicating pregnancy, childbirth and the puerperium: Secondary | ICD-10-CM | POA: Insufficient documentation

## 2013-08-29 DIAGNOSIS — N949 Unspecified condition associated with female genital organs and menstrual cycle: Secondary | ICD-10-CM | POA: Insufficient documentation

## 2013-08-29 DIAGNOSIS — Z79899 Other long term (current) drug therapy: Secondary | ICD-10-CM | POA: Insufficient documentation

## 2013-08-29 LAB — URINALYSIS, ROUTINE W REFLEX MICROSCOPIC
Glucose, UA: NEGATIVE mg/dL
Hgb urine dipstick: NEGATIVE
Specific Gravity, Urine: 1.016 (ref 1.005–1.030)
Urobilinogen, UA: 0.2 mg/dL (ref 0.0–1.0)
pH: 7.5 (ref 5.0–8.0)

## 2013-08-29 LAB — POCT PREGNANCY, URINE: Preg Test, Ur: POSITIVE — AB

## 2013-08-29 LAB — RPR: RPR Ser Ql: NONREACTIVE

## 2013-08-29 LAB — HIV ANTIBODY (ROUTINE TESTING W REFLEX): HIV: NONREACTIVE

## 2013-08-29 LAB — WET PREP, GENITAL: Yeast Wet Prep HPF POC: NONE SEEN

## 2013-08-29 MED ORDER — NYSTATIN 100000 UNIT/GM EX CREA: TOPICAL_CREAM | CUTANEOUS | Status: DC

## 2013-08-29 NOTE — ED Provider Notes (Signed)
CSN: [id number]     Arrival date & time 08/29/13  0129 History   First MD Initiated Contact with Patient 08/29/13 0340     Chief Complaint  Patient presents with  . Vaginal Discharge  . Vaginal Pain   HPI  History provided by the patient. Patient is a 22 year old female who reports being currently [redacted] weeks pregnant and presents with concerns for vaginal sores and rash. Patient states she has noticed some sores for the past week she became more concerned today when she looks at me. Initially patient thought she may have some irritations and scratches because she was itching the area. She also reports having some slight vaginal discharge for the past week. She was first diagnosed with her pregnancy after going to [facility] 3 days ago. She was told at that time her irritation may be from a yeast infection and she was given a nystatin cream. She was also given Flagyl to take for possible bacterial vaginitis.   She has been using these without any change in symptoms. Denies any other associated symptoms. No vaginal bleeding. No abdominal pain. No fever, chills or sweats.    Past Medical History  Diagnosis Date  . No pertinent past medical history    Past Surgical History  Procedure Laterality Date  . Nasal sugery     Family History  Problem Relation Age of Onset  . Gallstones Mother    History  Substance Use Topics  . Smoking status: Former Smoker    Quit date: 05/16/2011  . Smokeless tobacco: Never Used  . Alcohol Use: No   OB History   Grav Para Term Preterm Abortions TAB SAB Ect Mult Living   2 1 1  0 0 0 0 0 0 1     Review of Systems  Constitutional: Negative for fever, chills and diaphoresis.  Gastrointestinal: Negative for nausea, vomiting, abdominal pain and diarrhea.  Genitourinary: Positive for vaginal discharge and genital sores. Negative for dysuria, frequency, hematuria, flank pain, vaginal bleeding and menstrual problem.  All other systems reviewed and are  negative.    Allergies  Review of patient's allergies indicates no known allergies.  Home Medications   Current Outpatient Rx  Name  Route  Sig  Dispense  Refill  . metroNIDAZOLE (FLAGYL) 500 MG tablet   Oral   Take 1 tablet (500 mg total) by mouth 2 (two) times daily.   14 tablet   0   . nystatin cream (MYCOSTATIN)      Apply to affected area 2 times daily   15 g   0    BP 127/72  Pulse 85  Temp(Src) 98.6 F (37 C) (Oral)  Resp 18  SpO2 100%  LMP 06/16/2013 Physical Exam  Nursing note and vitals reviewed. Constitutional: She is oriented to person, place, and time. She appears well-developed and well-nourished. No distress.  HENT:  Head: Normocephalic.  Mouth/Throat: Oropharynx is clear and moist.  Cardiovascular: Normal rate and regular rhythm.   Pulmonary/Chest: Effort normal and breath sounds normal. No respiratory distress. She has no wheezes. She has no rales.  Abdominal: Soft. She exhibits no distension. There is no tenderness. There is no rebound and no guarding.  Genitourinary:  Chaperone was present.  Several small ulcerative type lesions to the inferior left medial labial minora and around the right labia majora. No bleeding or drainage. There is no vesicles or surrounding erythema. Small amount of vaginal discharge. Cervix is closed. No vaginal bleeding or signs of products of  conception. No adnexal pain or fullness. No masses. No CMT.  Neurological: She is alert and oriented to person, place, and time.  Skin: Skin is warm and dry. No rash noted.  Psychiatric: She has a normal mood and affect. Her behavior is normal.    ED Course  Procedures   Results for orders placed during the hospital encounter of 08/29/13  WET PREP, GENITAL      Result Value Range   Yeast Wet Prep HPF POC NONE SEEN  NONE SEEN   Trich, Wet Prep NONE SEEN  NONE SEEN   Clue Cells Wet Prep HPF POC NONE SEEN  NONE SEEN   WBC, Wet Prep HPF POC MODERATE (*) NONE SEEN  URINALYSIS,  ROUTINE W REFLEX MICROSCOPIC      Result Value Range   Color, Urine YELLOW  YELLOW   APPearance CLEAR  CLEAR   Specific Gravity, Urine 1.016  1.005 - 1.030   pH 7.5  5.0 - 8.0   Glucose, UA NEGATIVE  NEGATIVE mg/dL   Hgb urine dipstick NEGATIVE  NEGATIVE   Bilirubin Urine NEGATIVE  NEGATIVE   Ketones, ur NEGATIVE  NEGATIVE mg/dL   Protein, ur NEGATIVE  NEGATIVE mg/dL   Urobilinogen, UA 0.2  0.0 - 1.0 mg/dL   Nitrite NEGATIVE  NEGATIVE   Leukocytes, UA NEGATIVE  NEGATIVE  POCT PREGNANCY, URINE      Result Value Range   Preg Test, Ur POSITIVE (*) NEGATIVE        MDM   1. Ulcer of genital labia       Patient seen and evaluated. She appears well no acute distress. Denies any abdominal pains or vaginal bleeding.    Angus Seller, PA-C 08/29/13 [phone]

## 2013-08-29 NOTE — ED Notes (Signed)
Pt states vaginal sores, burning and discharge x 1 week.  Pt states only pain with urination is with the urine touching the sores.  Pt is [redacted] weeks pregnant.  No fevers.  No n/v.  Slight abdominal cramping noted.

## 2013-08-29 NOTE — ED Provider Notes (Signed)
Medical screening examination/treatment/procedure(s) were performed by non-physician practitioner and as supervising physician I was immediately available for consultation/collaboration.   Loren Racer, MD 08/29/13 [phone]

## 2013-08-30 LAB — GC/CHLAMYDIA PROBE AMP
CT Probe RNA: NEGATIVE
GC Probe RNA: NEGATIVE

## 2013-08-30 LAB — OB RESULTS CONSOLE GC/CHLAMYDIA
Chlamydia: NEGATIVE
Gonorrhea: NEGATIVE

## 2013-08-31 LAB — HERPES SIMPLEX VIRUS CULTURE

## 2013-09-07 ENCOUNTER — Ambulatory Visit: Payer: Self-pay | Admitting: Obstetrics

## 2013-09-16 ENCOUNTER — Telehealth (HOSPITAL_COMMUNITY): Payer: Self-pay | Admitting: *Deleted

## 2013-09-21 ENCOUNTER — Inpatient Hospital Stay (HOSPITAL_COMMUNITY)
Admission: AD | Admit: 2013-09-21 | Discharge: 2013-09-21 | Disposition: A | Discharge status: Home / Self Care | Payer: 59 | Source: Ambulatory Visit | Attending: Obstetrics & Gynecology | Admitting: Obstetrics & Gynecology

## 2013-09-21 ENCOUNTER — Encounter (HOSPITAL_COMMUNITY): Payer: Self-pay

## 2013-09-21 DIAGNOSIS — B373 Candidiasis of vulva and vagina: Secondary | ICD-10-CM | POA: Insufficient documentation

## 2013-09-21 DIAGNOSIS — O209 Hemorrhage in early pregnancy, unspecified: Secondary | ICD-10-CM | POA: Insufficient documentation

## 2013-09-21 DIAGNOSIS — B3731 Acute candidiasis of vulva and vagina: Secondary | ICD-10-CM | POA: Insufficient documentation

## 2013-09-21 DIAGNOSIS — O2390 Unspecified genitourinary tract infection in pregnancy, unspecified trimester: Secondary | ICD-10-CM | POA: Insufficient documentation

## 2013-09-21 LAB — WET PREP, GENITAL: Clue Cells Wet Prep HPF POC: NONE SEEN

## 2013-09-21 MED ORDER — FLUCONAZOLE 150 MG PO TABS
150.0000 mg | ORAL_TABLET | Freq: Once | ORAL | Status: AC
  Administered 2013-09-21: 150 mg via ORAL
  Filled 2013-09-21: qty 1

## 2013-09-21 MED ORDER — ONDANSETRON HCL 4 MG PO TABS: 4.0000 mg | ORAL_TABLET | Freq: Four times a day (QID) | ORAL | Status: DC

## 2013-09-21 MED ORDER — PROMETHAZINE HCL 25 MG PO TABS: 25.0000 mg | ORAL_TABLET | Freq: Four times a day (QID) | ORAL | Status: DC | PRN

## 2013-09-21 NOTE — MAU Note (Signed)
Patient states she has been having abdominal cramping for a while. Today when to the bathroom and had pink blood on the tissue. When she went again no blood.

## 2013-09-21 NOTE — MAU Provider Note (Signed)
History     CSN: [id number]  Arrival date and time: 09/21/13 1759   First Provider Initiated Contact with Patient 09/21/13 1934      Chief Complaint  Patient presents with  . Abdominal Pain  . Vaginal Bleeding   HPI [name] is a 22 y.o. G2P1001 at [redacted]w[redacted]d who presents to MAU today with vaginal bleeding. The patient states that she noted blood on the tissue when she wiped earlier today once. She has not noted any since then. She is also having occasional lower abdominal cramping that has been ongoing since her last visit in MAU. The patient was seen at [facility] one week ago and treated for a yeast infection with nystatin cream. She states that discharge has improved some since then. She denies intercourse recently.   OB History   Grav Para Term Preterm Abortions TAB SAB Ect Mult Living   2 1 1  0 0 0 0 0 0 1      Past Medical History  Diagnosis Date  . No pertinent past medical history     Past Surgical History  Procedure Laterality Date  . Nasal sugery      Family History  Problem Relation Age of Onset  . Gallstones Mother     History  Substance Use Topics  . Smoking status: Former Smoker    Quit date: 05/16/2011  . Smokeless tobacco: Never Used  . Alcohol Use: No    Allergies: No Known Allergies  Prescriptions prior to admission  Medication Sig Dispense Refill  . metroNIDAZOLE (FLAGYL) 500 MG tablet Take 1 tablet (500 mg total) by mouth 2 (two) times daily.  14 tablet  0  . nystatin cream (MYCOSTATIN) Apply 1 application topically 2 (two) times daily. Apply to affected area 2 times daily      . nystatin cream (MYCOSTATIN) Apply to affected area 2 times daily  15 g  0    Review of Systems  Constitutional: Negative for fever and malaise/fatigue.  Gastrointestinal: Positive for constipation. Negative for nausea, vomiting, abdominal pain and diarrhea.  Genitourinary: Negative for dysuria, urgency and frequency.       + vaginal discharge, bleeding    Physical Exam   Blood pressure 113/64, pulse 72, temperature 99.2 F (37.3 C), temperature source Oral, resp. rate 16, height 5\' 8"  (1.727 m), weight 200 lb 9.6 oz (90.992 kg), last menstrual period 06/16/2013, SpO2 100.00%.  Physical Exam  Constitutional: She is oriented to person, place, and time. She appears well-developed and well-nourished. No distress.  HENT:  Head: Normocephalic and atraumatic.  Cardiovascular: Normal rate, regular rhythm and normal heart sounds.   Respiratory: Effort normal and breath sounds normal. No respiratory distress.  GI: Soft. Bowel sounds are normal. She exhibits no distension and no mass. There is no tenderness. There is no rebound and no guarding.  Genitourinary: Vaginal discharge (small amount of thick, white discharge noted with scant blood) found.  Neurological: She is alert and oriented to person, place, and time.  Skin: Skin is warm and dry. No erythema.  Psychiatric: She has a normal mood and affect.   Results for orders placed during the hospital encounter of 09/21/13 (from the past 24 hour(s))  WET PREP, GENITAL     Status: Abnormal   Collection Time    09/21/13  7:45 PM      Result Value Range   Yeast Wet Prep HPF POC NONE SEEN  NONE SEEN   Trich, Wet Prep NONE SEEN  NONE  SEEN   Clue Cells Wet Prep HPF POC NONE SEEN  NONE SEEN   WBC, Wet Prep HPF POC FEW (*) NONE SEEN    MAU Course  Procedures None  MDM Wet prep today 150 mg Diflucan given in MAU today Assessment and Plan  A: Yeast vaginitis, clinical  P: Discharge home Treated in MAU today Bleeding precautions discussed Patient advised to follow-up with Dr. Tamela Oddi as scheduled Patient may return to MAU as needed or if her condition were to change or worsen  Freddi Starr, PA-C  09/21/2013, 7:34 PM

## 2013-09-21 NOTE — MAU Note (Signed)
Patient is not in the lobby when called to triage.  

## 2013-10-13 ENCOUNTER — Encounter: Payer: 59 | Admitting: Advanced Practice Midwife

## 2013-10-20 ENCOUNTER — Ambulatory Visit (INDEPENDENT_AMBULATORY_CARE_PROVIDER_SITE_OTHER): Payer: 59 | Admitting: Advanced Practice Midwife

## 2013-10-20 ENCOUNTER — Other Ambulatory Visit (INDEPENDENT_AMBULATORY_CARE_PROVIDER_SITE_OTHER): Payer: 59

## 2013-10-20 ENCOUNTER — Encounter: Payer: Self-pay | Admitting: Advanced Practice Midwife

## 2013-10-20 ENCOUNTER — Other Ambulatory Visit: Payer: Self-pay | Admitting: Advanced Practice Midwife

## 2013-10-20 VITALS — BP 116/72 | Temp 97.6°F | Wt 207.0 lb

## 2013-10-20 DIAGNOSIS — O200 Threatened abortion: Secondary | ICD-10-CM

## 2013-10-20 DIAGNOSIS — Z3689 Encounter for other specified antenatal screening: Secondary | ICD-10-CM

## 2013-10-20 DIAGNOSIS — B009 Herpesviral infection, unspecified: Secondary | ICD-10-CM

## 2013-10-20 DIAGNOSIS — Z3480 Encounter for supervision of other normal pregnancy, unspecified trimester: Secondary | ICD-10-CM | POA: Insufficient documentation

## 2013-10-20 DIAGNOSIS — D649 Anemia, unspecified: Secondary | ICD-10-CM | POA: Insufficient documentation

## 2013-10-20 LAB — US OB COMP LESS 14 WKS

## 2013-10-20 NOTE — Addendum Note (Signed)
Addended by: George Hugh on: 10/20/2013 04:33 PM   Modules accepted: Orders

## 2013-10-20 NOTE — Progress Notes (Signed)
Pulse- 78  Subjective:    Jessica Vasquez is being seen today for her first obstetrical visit.  This is not a planned pregnancy. She is at [redacted]w[redacted]d gestation. Her obstetrical history is significant for none. Relationship with FOB: significant other, living together. Patient does intend to breast feed. Pregnancy history fully reviewed.  Patient reports recent hx of HSV, has medication therapy. Patient reports she desires an [place] before 18-20 weeks.  Patient reports having a pap smear in September that was normal.   Menstrual History: OB History   Grav Para Term Preterm Abortions TAB SAB Ect Mult Living   2 1 1  0 0 0 0 0 0 1      Last Pap: 08/2013 Results were normal Menarche age: 68 Regular  Patient's last menstrual period was 06/16/2013.    The following portions of the patient's history were reviewed and updated as appropriate: allergies, current medications, past family history, past medical history, past social history, past surgical history and problem list.  Review of Systems A comprehensive review of systems was negative.    Objective:    BP 116/72  Temp(Src) 97.6 F (36.4 C)  Wt 207 lb (93.895 kg)  LMP 06/16/2013 General appearance: alert Head: Normocephalic, without obvious abnormality, atraumatic Eyes: conjunctivae/corneas clear. PERRL, EOM's intact. Fundi benign. Nose: Nares normal. Septum midline. Mucosa normal. No drainage or sinus tenderness. Throat: lips, mucosa, and tongue normal; teeth and gums normal Neck: no adenopathy, no carotid bruit, no JVD, supple, symmetrical, trachea midline and thyroid not enlarged, symmetric, no tenderness/mass/nodules Back: symmetric, no curvature. ROM normal. No CVA tenderness. Lungs: clear to auscultation bilaterally Breasts: normal appearance, no masses or tenderness Heart: regular rate and rhythm, S1, S2 normal, no murmur, click, rub or gallop Abdomen: soft, non-tender; bowel sounds normal; no masses,  no organomegaly Skin: Skin  color, texture, turgor normal. No rashes or lesions Lymph nodes: Cervical, supraclavicular, and axillary nodes normal. Neurologic: Alert and oriented X 3, normal strength and tone. Normal symmetric reflexes. Normal coordination and gait    Assessment:    Pregnancy at [redacted]w[redacted]d weeks  Patient Active Problem List   Diagnosis Date Noted  . Supervision of other normal pregnancy 10/20/2013  . Anemia 10/20/2013  . 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 Chronic cholecystitis with calculus 12/29/2012      Plan:    Initial labs drawn. Prenatal vitamins.  Counseling provided regarding continued use of seat belts, cessation of alcohol consumption, smoking or use of illicit drugs; infection precautions i.e., influenza/TDAP immunizations, toxoplasmosis,CMV, parvovirus, listeria and varicella; workplace safety, exercise during pregnancy; routine dental care, safe medications, sexual activity, hot tubs, saunas, pools, travel, caffeine use, fish and methlymercury, potential toxins, hair treatments, varicose veins Weight gain recommendations reviewed: underweight/BMI< 18.5--> gain 28 - 40 lbs; normal weight/BMI 18.5 - 24.9--> gain 25 - 35 lbs; overweight/BMI 25 - 29.9--> gain 15 - 25 lbs; obese/BMI >30->gain  11 - 20 lbs Problem list reviewed and updated. AFP3 discussed: plan Quad [place]. Role of ultrasound in pregnancy discussed; fetal survey: requested. Amniocentesis discussed: not indicated. Follow up in 4 weeks. Dating [place] ordered 80% of 40 min visit spent on counseling and coordination of care.    Zeph Riebel Wilson Singer CNM

## 2013-10-21 LAB — VITAMIN D 25 HYDROXY (VIT D DEFICIENCY, FRACTURES): Vit D, 25-Hydroxy: 18 ng/mL — ABNORMAL LOW (ref 30–89)

## 2013-10-21 LAB — CULTURE, OB URINE: Colony Count: NO GROWTH

## 2013-10-22 ENCOUNTER — Other Ambulatory Visit: Payer: Self-pay

## 2013-10-22 LAB — OBSTETRIC PANEL
Basophils Absolute: 0 10*3/uL (ref 0.0–0.1)
Basophils Relative: 0 % (ref 0–1)
Eosinophils Absolute: 0.1 10*3/uL (ref 0.0–0.7)
Eosinophils Relative: 1 % (ref 0–5)
Hepatitis B Surface Ag: NEGATIVE
Lymphocytes Relative: 27 % (ref 12–46)
Lymphs Abs: 2.6 10*3/uL (ref 0.7–4.0)
MCH: 29 pg (ref 26.0–34.0)
MCHC: 33.4 g/dL (ref 30.0–36.0)
MCV: 86.6 fL (ref 78.0–100.0)
Neutro Abs: 6.3 10*3/uL (ref 1.7–7.7)
Neutrophils Relative %: 66 % (ref 43–77)
Platelets: 248 10*3/uL (ref 150–400)
RDW: 14 % (ref 11.5–15.5)
Rh Type: POSITIVE
WBC: 9.5 10*3/uL (ref 4.0–10.5)

## 2013-10-22 LAB — VARICELLA ZOSTER ANTIBODY, IGG: Varicella IgG: 1215 Index — ABNORMAL HIGH (ref <135.00)

## 2013-10-22 LAB — HIV ANTIBODY (ROUTINE TESTING W REFLEX): HIV: NONREACTIVE

## 2013-10-23 LAB — HEMOGLOBINOPATHY EVALUATION
Hgb A2 Quant: 2.7 % (ref 2.2–3.2)
Hgb A: 97.3 % (ref 96.8–97.8)
Hgb F Quant: 0 % (ref 0.0–2.0)
Hgb S Quant: 0 %

## 2013-11-17 ENCOUNTER — Ambulatory Visit (INDEPENDENT_AMBULATORY_CARE_PROVIDER_SITE_OTHER): Payer: 59 | Admitting: Obstetrics

## 2013-11-17 ENCOUNTER — Encounter: Payer: Self-pay | Admitting: Obstetrics

## 2013-11-17 ENCOUNTER — Encounter: Payer: 59 | Admitting: Advanced Practice Midwife

## 2013-11-17 VITALS — BP 136/76 | Temp 97.8°F | Wt 207.0 lb

## 2013-11-17 DIAGNOSIS — N760 Acute vaginitis: Secondary | ICD-10-CM

## 2013-11-17 DIAGNOSIS — B9689 Other specified bacterial agents as the cause of diseases classified elsewhere: Secondary | ICD-10-CM

## 2013-11-17 DIAGNOSIS — Z363 Encounter for antenatal screening for malformations: Secondary | ICD-10-CM

## 2013-11-17 DIAGNOSIS — Z1389 Encounter for screening for other disorder: Secondary | ICD-10-CM

## 2013-11-17 DIAGNOSIS — Z3480 Encounter for supervision of other normal pregnancy, unspecified trimester: Secondary | ICD-10-CM

## 2013-11-17 DIAGNOSIS — Z3482 Encounter for supervision of other normal pregnancy, second trimester: Secondary | ICD-10-CM

## 2013-11-17 DIAGNOSIS — A499 Bacterial infection, unspecified: Secondary | ICD-10-CM

## 2013-11-17 LAB — POCT URINALYSIS DIPSTICK
Blood, UA: NEGATIVE
Glucose, UA: NEGATIVE
Spec Grav, UA: 1.01
Urobilinogen, UA: NEGATIVE
pH, UA: 8

## 2013-11-17 MED ORDER — METRONIDAZOLE 500 MG PO TABS: 500.0000 mg | ORAL_TABLET | Freq: Two times a day (BID) | ORAL | Status: DC

## 2013-11-17 NOTE — Progress Notes (Signed)
Pulse 88  Pt states that she is having some vaginal discharge with odor. Pt states she has no other complaints.

## 2013-11-18 LAB — AFP, QUAD SCREEN
AFP: 40.4 IU/mL
Age Alone: 1:154 {titer}
Down Syndrome Scr Risk Est: 1:2580 {titer}
HCG, Total: 21857 m[IU]/mL
MoM for INH: 1.07
MoM for hCG: 1.18
Open Spina bifida: NEGATIVE
Tri 18 Scr Risk Est: NEGATIVE

## 2013-12-01 ENCOUNTER — Ambulatory Visit (INDEPENDENT_AMBULATORY_CARE_PROVIDER_SITE_OTHER): Payer: 59

## 2013-12-01 DIAGNOSIS — Z1389 Encounter for screening for other disorder: Secondary | ICD-10-CM

## 2013-12-02 ENCOUNTER — Encounter: Payer: Self-pay | Admitting: Obstetrics

## 2013-12-02 LAB — US OB DETAIL + 14 WK

## 2013-12-07 ENCOUNTER — Encounter (HOSPITAL_COMMUNITY): Payer: Self-pay

## 2013-12-07 ENCOUNTER — Inpatient Hospital Stay (HOSPITAL_COMMUNITY)
Admission: AD | Admit: 2013-12-07 | Discharge: 2013-12-07 | Disposition: A | Discharge status: Home / Self Care | Payer: 59 | Source: Ambulatory Visit | Attending: Obstetrics & Gynecology | Admitting: Obstetrics & Gynecology

## 2013-12-07 DIAGNOSIS — M545 Low back pain, unspecified: Secondary | ICD-10-CM | POA: Insufficient documentation

## 2013-12-07 DIAGNOSIS — R109 Unspecified abdominal pain: Secondary | ICD-10-CM | POA: Insufficient documentation

## 2013-12-07 DIAGNOSIS — Z87891 Personal history of nicotine dependence: Secondary | ICD-10-CM | POA: Insufficient documentation

## 2013-12-07 DIAGNOSIS — O99891 Other specified diseases and conditions complicating pregnancy: Secondary | ICD-10-CM | POA: Insufficient documentation

## 2013-12-07 DIAGNOSIS — Y92410 Unspecified street and highway as the place of occurrence of the external cause: Secondary | ICD-10-CM | POA: Insufficient documentation
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
## 2013-12-08 ENCOUNTER — Ambulatory Visit (INDEPENDENT_AMBULATORY_CARE_PROVIDER_SITE_OTHER): Payer: 59 | Admitting: Obstetrics

## 2013-12-08 ENCOUNTER — Encounter: Payer: Self-pay | Admitting: Obstetrics

## 2013-12-08 ENCOUNTER — Ambulatory Visit (INDEPENDENT_AMBULATORY_CARE_PROVIDER_SITE_OTHER): Payer: 59

## 2013-12-08 VITALS — BP 115/72 | Temp 98.4°F | Wt 213.0 lb

## 2013-12-08 DIAGNOSIS — Z3480 Encounter for supervision of other normal pregnancy, unspecified trimester: Secondary | ICD-10-CM

## 2013-12-08 DIAGNOSIS — Z3482 Encounter for supervision of other normal pregnancy, second trimester: Secondary | ICD-10-CM

## 2013-12-08 LAB — US OB LIMITED

## 2013-12-08 LAB — POCT URINALYSIS DIPSTICK
Bilirubin, UA: NEGATIVE
Glucose, UA: NEGATIVE
Ketones, UA: NEGATIVE
Spec Grav, UA: 1.015
pH, UA: 6.5
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
## 2013-12-17 NOTE — L&D Delivery Note (Signed)
Delivery Note At 3:45 AM a viable female was delivered via Vaginal, Spontaneous Delivery (Presentation: ; Occiput Anterior).  APGAR: , ; weight .   Placenta status: Intact, Spontaneous.  Cord: 3 vessels with the following complications: None.  Cord pH: not done  Anesthesia: Epidural  Episiotomy: None Lacerations: None Suture Repair: 2.0 Est. Blood Loss (mL): 250  Mom to postpartum.  Baby to Couplet care / Skin to Skin.  Jessica Vasquez 04/27/2014, 3:57 AM

## 2013-12-21 ENCOUNTER — Encounter: Payer: Self-pay | Admitting: Obstetrics

## 2013-12-21 LAB — US OB DETAIL + 14 WK

## 2014-01-03 ENCOUNTER — Encounter (HOSPITAL_COMMUNITY): Payer: Self-pay | Admitting: Family

## 2014-01-03 ENCOUNTER — Inpatient Hospital Stay (HOSPITAL_COMMUNITY)
Admission: AD | Admit: 2014-01-03 | Discharge: 2014-01-03 | Disposition: A | Discharge status: Home / Self Care | Payer: 59 | Source: Ambulatory Visit | Attending: Obstetrics | Admitting: Obstetrics

## 2014-01-03 DIAGNOSIS — O209 Hemorrhage in early pregnancy, unspecified: Secondary | ICD-10-CM | POA: Insufficient documentation

## 2014-01-03 DIAGNOSIS — N930 Postcoital and contact bleeding: Secondary | ICD-10-CM

## 2014-01-03 DIAGNOSIS — O368190 Decreased fetal movements, unspecified trimester, not applicable or unspecified: Secondary | ICD-10-CM | POA: Insufficient documentation

## 2014-01-03 LAB — URINALYSIS, ROUTINE W REFLEX MICROSCOPIC
BILIRUBIN URINE: NEGATIVE
Glucose, UA: NEGATIVE mg/dL
Ketones, ur: NEGATIVE mg/dL
Nitrite: NEGATIVE
PROTEIN: NEGATIVE mg/dL
Specific Gravity, Urine: 1.01 (ref 1.005–1.030)
UROBILINOGEN UA: 0.2 mg/dL (ref 0.0–1.0)
pH: 6 (ref 5.0–8.0)

## 2014-01-03 LAB — URINE MICROSCOPIC-ADD ON

## 2014-01-03 NOTE — MAU Note (Signed)
Pt presents with complaints of vaginal bleeding today and has not felt the baby move since last night.

## 2014-01-03 NOTE — MAU Note (Signed)
23 yo, G2P1 at 6868w4d, presents with c/o dysuria and increased frequency x 3 days. Denies fever, chills, n/v, flank pain. Reports some light red vaginal spotting noted today, last intercourse yesterday.

## 2014-01-03 NOTE — Discharge Instructions (Signed)
Vaginal Bleeding During Pregnancy, Second Trimester °A small amount of bleeding (spotting) from the vagina is relatively common in pregnancy. It usually stops on its own. Various things can cause bleeding or spotting in pregnancy. Some bleeding may be related to the pregnancy, and some may not. Sometimes the bleeding is normal and is not a problem. However, bleeding can also be a sign of something serious. Be sure to tell your health care provider about any vaginal bleeding right away. °Some possible causes of vaginal bleeding during the second trimester include: °· Infection, inflammation, or growths on the cervix.   °· The placenta may be partially or completely covering the opening of the cervix inside the uterus (placenta previa). °· The placenta may have separated from the uterus (abruption of the placenta).   °· You may be having early (preterm) labor.   °· The cervix may not be strong enough to keep a baby inside the uterus (cervical insufficiency).   °· Tiny cysts may have developed in the uterus instead of pregnancy tissue (molar pregnancy).  °HOME CARE INSTRUCTIONS  °Watch your condition for any changes. The following actions may help to lessen any discomfort you are feeling: °· Follow your health care provider's instructions for limiting your activity. If your health care provider orders bed rest, you may need to stay in bed and only get up to use the bathroom. However, your health care provider may allow you to continue light activity. °· If needed, make plans for someone to help with your regular activities and responsibilities while you are on bed rest. °· Keep track of the number of pads you use each day, how often you change pads, and how soaked (saturated) they are. Write this down. °· Do not use tampons. Do not douche. °· Do not have sexual intercourse or orgasms until approved by your health care provider. °· If you pass any tissue from your vagina, save the tissue so you can show it to your  health care provider. °· Only take over-the-counter or prescription medicines as directed by your health care provider. °· Do not take aspirin because it can make you bleed. °· Do not exercise or perform any strenuous activities or heavy lifting without your health care provider's permission. °· Keep all follow-up appointments as directed by your health care provider. °SEEK MEDICAL CARE IF: °· You have any vaginal bleeding during any part of your pregnancy. °· You have cramps or labor pains. °SEEK IMMEDIATE MEDICAL CARE IF:  °· You have severe cramps in your back or belly (abdomen). °· You have contractions. °· You have a fever, not controlled by medicine. °· You have chills. °· You pass large clots or tissue from your vagina. °· Your bleeding increases. °· You feel lightheaded or weak, or you have fainting episodes. °· You are leaking fluid or have a gush of fluid from your vagina. °MAKE SURE YOU: °· Understand these instructions. °· Will watch your condition. °· Will get help right away if you are not doing well or get worse. °Document Released: 09/12/2005 Document Revised: 09/23/2013 Document Reviewed: 08/10/2013 °Exit[employer]® Patient Information ©2014 [employer]. ° °

## 2014-01-03 NOTE — MAU Provider Note (Signed)
History     CSN: [id number]  Arrival date and time: 01/03/14 1422   First Provider Initiated Contact with Patient 01/03/14 1603      Chief Complaint  Patient presents with  . Decreased Fetal Movement   HPI [name] is a 23 y.o. G2P1001 at 3953w4d who presents to MAU today with complaint of vaginal bleeding and decreased fetal movement. The patient states that she has noted appropriate movement since arrival in MAU. She endorses light red vaginal bleeding with wiping only this morning. Last intercourse was last night. She also endorses dysuria, urinary frequency and urinary urgency x 3 days. She denies fever or flank pain. She has occasional N/V unchanged from earlier in pregnancy.   OB History   Grav Para Term Preterm Abortions TAB SAB Ect Mult Living   2 1 1  0 0 0 0 0 0 1      Past Medical History  Diagnosis Date  . No pertinent past medical history     Past Surgical History  Procedure Laterality Date  . Nasal sugery      Family History  Problem Relation Age of Onset  . Gallstones Mother     History  Substance Use Topics  . Smoking status: Former Smoker    Quit date: 05/16/2011  . Smokeless tobacco: Never Used  . Alcohol Use: No    Allergies: No Known Allergies  Prescriptions prior to admission  Medication Sig Dispense Refill  . Prenatal Vit-Fe Fumarate-FA (MULTIVITAMIN-PRENATAL) 27-0.8 MG TABS tablet Take 1 tablet by mouth daily at 12 noon.        Review of Systems  Constitutional: Negative for fever and malaise/fatigue.  Gastrointestinal: Positive for nausea and vomiting. Negative for abdominal pain.  Genitourinary: Positive for dysuria, urgency and frequency.       + vaginal bleeding   Physical Exam   Blood pressure 128/62, pulse 89, temperature 98.6 F (37 C), resp. rate 18, height 5\' 10"  (1.778 m), weight 213 lb (96.616 kg), last menstrual period 06/16/2013.  Physical Exam  Constitutional: She is oriented to person, place, and time. She  appears well-developed and well-nourished. No distress.  HENT:  Head: Normocephalic and atraumatic.  Cardiovascular: Normal rate, regular rhythm and normal heart sounds.   Respiratory: Effort normal and breath sounds normal. No respiratory distress.  GI: Soft. Bowel sounds are normal. She exhibits no distension and no mass. There is no tenderness. There is no rebound and no guarding.  Genitourinary: Uterus is enlarged (appropriate for GA). Uterus is not tender. Cervix exhibits no motion tenderness, no discharge and no friability. No bleeding around the vagina. Vaginal discharge (scant thin, white discharge) found.  Neurological: She is alert and oriented to person, place, and time.  Skin: Skin is warm and dry. No erythema.  Psychiatric: She has a normal mood and affect.  Dilation: Closed Effacement (%): Thick Exam by:: Vernie AmmonsEthier, PA   Results for orders placed during the hospital encounter of 01/03/14 (from the past 24 hour(s))  URINALYSIS, ROUTINE W REFLEX MICROSCOPIC     Status: Abnormal   Collection Time    01/03/14  3:00 PM      Result Value Range   Color, Urine YELLOW  YELLOW   APPearance CLEAR  CLEAR   Specific Gravity, Urine 1.010  1.005 - 1.030   pH 6.0  5.0 - 8.0   Glucose, UA NEGATIVE  NEGATIVE mg/dL   Hgb urine dipstick SMALL (*) NEGATIVE   Bilirubin Urine NEGATIVE  NEGATIVE  Ketones, ur NEGATIVE  NEGATIVE mg/dL   Protein, ur NEGATIVE  NEGATIVE mg/dL   Urobilinogen, UA 0.2  0.0 - 1.0 mg/dL   Nitrite NEGATIVE  NEGATIVE   Leukocytes, UA SMALL (*) NEGATIVE  URINE MICROSCOPIC-ADD ON     Status: Abnormal   Collection Time    01/03/14  3:00 PM      Result Value Range   Squamous Epithelial / LPF MANY (*) RARE   WBC, UA 21-50  <3 WBC/hpf   RBC / HPF 0-2  <3 RBC/hpf   Bacteria, UA RARE  RARE    Fetal Monitoring: Baseline: 130 bpm, moderate variability, + accelerations, no decelerations Contractions: none MAU Course  Procedures None  MDM Patient reports normal FM while  in MAU No bleeding noted on exam Cervix is closed No contractions noted on NST. Patient denies contractions or LOF  Assessment and Plan  A: Post coital bleeding  P: Discharge home Bleeding precautions discussed Labor precautions discussed Patient advised to follow-up with Dr. Clearance Coots as scheduled this week for routine prenatal appointment Patient may return to MAU as needed or if her condition were to change or worsen  Freddi Starr, PA-C  01/03/2014, 5:12 PM

## 2014-01-05 ENCOUNTER — Other Ambulatory Visit: Payer: 59

## 2014-01-05 ENCOUNTER — Encounter: Payer: 59 | Admitting: Obstetrics

## 2014-01-21 ENCOUNTER — Encounter: Payer: Self-pay | Admitting: Obstetrics

## 2014-01-21 ENCOUNTER — Ambulatory Visit (INDEPENDENT_AMBULATORY_CARE_PROVIDER_SITE_OTHER): Payer: 59 | Admitting: Obstetrics

## 2014-01-21 VITALS — BP 106/71 | Temp 99.1°F | Wt 215.0 lb

## 2014-01-21 DIAGNOSIS — Z3480 Encounter for supervision of other normal pregnancy, unspecified trimester: Secondary | ICD-10-CM

## 2014-01-21 LAB — POCT URINALYSIS DIPSTICK
BILIRUBIN UA: NEGATIVE
Blood, UA: NEGATIVE
Glucose, UA: NEGATIVE
KETONES UA: NEGATIVE
NITRITE UA: NEGATIVE
PH UA: 6
Spec Grav, UA: >=1.03
Urobilinogen, UA: NEGATIVE

## 2014-01-21 NOTE — Progress Notes (Signed)
Pulse: 98 Patient denies any concerns.

## 2014-02-03 ENCOUNTER — Encounter: Payer: 59 | Admitting: Obstetrics

## 2014-02-03 ENCOUNTER — Other Ambulatory Visit: Payer: 59

## 2014-03-02 ENCOUNTER — Encounter: Payer: Self-pay | Admitting: Obstetrics

## 2014-03-02 ENCOUNTER — Ambulatory Visit (INDEPENDENT_AMBULATORY_CARE_PROVIDER_SITE_OTHER): Payer: 59 | Admitting: Obstetrics

## 2014-03-02 VITALS — BP 117/69 | Temp 97.9°F | Wt 220.0 lb

## 2014-03-02 DIAGNOSIS — Z3480 Encounter for supervision of other normal pregnancy, unspecified trimester: Secondary | ICD-10-CM

## 2014-03-02 LAB — POCT URINALYSIS DIPSTICK
Bilirubin, UA: NEGATIVE
Ketones, UA: NEGATIVE
LEUKOCYTES UA: NEGATIVE
NITRITE UA: NEGATIVE
PH UA: 6.5
RBC UA: NEGATIVE
Spec Grav, UA: 1.015
UROBILINOGEN UA: NEGATIVE

## 2014-03-02 NOTE — Progress Notes (Signed)
Pulse- 100 Pt states she is having pain in her lower abdomen in the evenings.

## 2014-03-14 ENCOUNTER — Inpatient Hospital Stay (HOSPITAL_COMMUNITY)
Admission: AD | Admit: 2014-03-14 | Discharge: 2014-03-15 | Disposition: A | Discharge status: Home / Self Care | Payer: 59 | Source: Ambulatory Visit | Attending: Obstetrics | Admitting: Obstetrics

## 2014-03-14 DIAGNOSIS — Z87891 Personal history of nicotine dependence: Secondary | ICD-10-CM | POA: Insufficient documentation

## 2014-03-14 DIAGNOSIS — O99891 Other specified diseases and conditions complicating pregnancy: Secondary | ICD-10-CM | POA: Insufficient documentation

## 2014-03-14 DIAGNOSIS — Z3480 Encounter for supervision of other normal pregnancy, unspecified trimester: Secondary | ICD-10-CM

## 2014-03-14 DIAGNOSIS — B009 Herpesviral infection, unspecified: Secondary | ICD-10-CM

## 2014-03-14 DIAGNOSIS — D649 Anemia, unspecified: Secondary | ICD-10-CM

## 2014-03-14 DIAGNOSIS — O9989 Other specified diseases and conditions complicating pregnancy, childbirth and the puerperium: Principal | ICD-10-CM

## 2014-03-14 DIAGNOSIS — L299 Pruritus, unspecified: Secondary | ICD-10-CM | POA: Insufficient documentation

## 2014-03-14 HISTORY — DX: Herpesviral infection, unspecified: B00.9

## 2014-03-14 HISTORY — DX: Anemia, unspecified: D64.9

## 2014-03-14 NOTE — MAU Note (Signed)
Pt reports she has been itching off/on for a month but for the last 2 days it is constant and nothing is helping. Voiding more frequently

## 2014-03-15 ENCOUNTER — Encounter (HOSPITAL_COMMUNITY): Payer: Self-pay | Admitting: *Deleted

## 2014-03-15 DIAGNOSIS — O9989 Other specified diseases and conditions complicating pregnancy, childbirth and the puerperium: Principal | ICD-10-CM

## 2014-03-15 DIAGNOSIS — N898 Other specified noninflammatory disorders of vagina: Secondary | ICD-10-CM

## 2014-03-15 DIAGNOSIS — O99891 Other specified diseases and conditions complicating pregnancy: Secondary | ICD-10-CM

## 2014-03-15 DIAGNOSIS — L298 Other pruritus: Secondary | ICD-10-CM

## 2014-03-15 LAB — COMPREHENSIVE METABOLIC PANEL
ALT: 11 U/L (ref 0–35)
AST: 15 U/L (ref 0–37)
Albumin: 2.4 g/dL — ABNORMAL LOW (ref 3.5–5.2)
Alkaline Phosphatase: 79 U/L (ref 39–117)
BUN: 5 mg/dL — AB (ref 6–23)
CO2: 20 mEq/L (ref 19–32)
CREATININE: 0.5 mg/dL (ref 0.50–1.10)
Calcium: 8.7 mg/dL (ref 8.4–10.5)
Chloride: 103 mEq/L (ref 96–112)
GFR calc Af Amer: >90 mL/min (ref >90)
GFR calc non Af Amer: >90 mL/min (ref >90)
Glucose, Bld: 101 mg/dL — ABNORMAL HIGH (ref 70–99)
Potassium: 3.6 mEq/L — ABNORMAL LOW (ref 3.7–5.3)
Sodium: 136 mEq/L — ABNORMAL LOW (ref 137–147)
TOTAL PROTEIN: 5.8 g/dL — AB (ref 6.0–8.3)
Total Bilirubin: <0.2 mg/dL — ABNORMAL LOW (ref 0.3–1.2)

## 2014-03-15 LAB — URINALYSIS, ROUTINE W REFLEX MICROSCOPIC
Bilirubin Urine: NEGATIVE
Glucose, UA: NEGATIVE mg/dL
HGB URINE DIPSTICK: NEGATIVE
KETONES UR: NEGATIVE mg/dL
NITRITE: NEGATIVE
Protein, ur: NEGATIVE mg/dL
Specific Gravity, Urine: 1.015 (ref 1.005–1.030)
UROBILINOGEN UA: 0.2 mg/dL (ref 0.0–1.0)
pH: 6.5 (ref 5.0–8.0)

## 2014-03-15 LAB — URINE MICROSCOPIC-ADD ON

## 2014-03-15 MED ORDER — NITROFURANTOIN MONOHYD MACRO 100 MG PO CAPS: 100.0000 mg | ORAL_CAPSULE | Freq: Two times a day (BID) | ORAL | Status: DC

## 2014-03-15 MED ORDER — DIPHENHYDRAMINE HCL 25 MG PO CAPS
50.0000 mg | ORAL_CAPSULE | Freq: Once | ORAL | Status: AC
  Administered 2014-03-15: 50 mg via ORAL
  Filled 2014-03-15: qty 2

## 2014-03-15 NOTE — MAU Provider Note (Signed)
  History     CSN: [id number]  Arrival date and time: 03/14/14 2335   First Provider Initiated Contact with Patient 03/15/14 0014      Chief Complaint  Patient presents with  . Urinary Frequency  . Pruritis   HPI  Pt is a 23 yo G2P1001 at 6244w5d wks IUP here with report of generalized body itching x one month that has intensified over past three days. Itching is not present on palms of hand or soles of feet.  Pt denies rash or fever.  No report of using new laundry detergent, soaps or perfumes.  Denies new sheets or other potential allergens.  Has tried topical Aveeno to help relieve itching.  Pt came in tonight due to difficulty sleeping due to the itching.    Past Medical History  Diagnosis Date  . No pertinent past medical history   . Anemia   . Herpes     last outbreak two weeks ago ( mid march 2015)    Past Surgical History  Procedure Laterality Date  . Nasal sugery      Family History  Problem Relation Age of Onset  . Gallstones Mother     History  Substance Use Topics  . Smoking status: Former Smoker    Quit date: 05/16/2011  . Smokeless tobacco: Never Used  . Alcohol Use: No    Allergies: No Known Allergies  Prescriptions prior to admission  Medication Sig Dispense Refill  . Prenatal Vit-Fe Fumarate-FA (MULTIVITAMIN-PRENATAL) 27-0.8 MG TABS tablet Take 1 tablet by mouth daily at 12 noon.        Review of Systems  Constitutional: Negative for fever and chills.  Eyes: Negative for discharge and redness.  Respiratory: Negative for cough.   Gastrointestinal: Negative for abdominal pain.  Genitourinary: Positive for frequency. Negative for dysuria, urgency and hematuria.  Musculoskeletal: Negative for myalgias.  Skin: Positive for itching. Negative for rash.  Neurological: Negative for headaches.  All other systems reviewed and are negative.   Physical Exam   Blood pressure 128/74, pulse 87, temperature 98.5 F (36.9 C), temperature source Oral,  resp. rate 20, height 5\' 9"  (1.753 m), weight 101.606 kg (224 lb), last menstrual period 06/16/2013, SpO2 100.00%.  Physical Exam  Constitutional: She is oriented to person, place, and time. She appears well-developed and well-nourished.  HENT:  Head: Normocephalic.  Neck: Normal range of motion. Neck supple.  Cardiovascular: Normal rate, regular rhythm and normal heart sounds.   Respiratory: Effort normal and breath sounds normal.  GI: Soft. There is no tenderness.  Genitourinary: No bleeding around the vagina.  Neurological: She is alert and oriented to person, place, and time.  Skin: Skin is warm and dry. No rash noted. No erythema.   FHR 120's, +accels Toco - none MAU Course  Procedures  0055 Improvement in itching; pt sleeping.   Assessment and Plan   23 yo G2P1001 at 7544w5d weeks IUP Leukuria  Generalized Itching  Plan: Bile Acids pending RX Macrobid Urine culture sent OTC Benadryl 50mg  HS for itching/sleep   [facility] PennsylvaniaMUHAMMAD,WALIDAH 03/15/2014, 12:19 AM

## 2014-03-15 NOTE — MAU Note (Signed)
Reports itching is all over legs, stomach, back and arms.

## 2014-03-16 ENCOUNTER — Ambulatory Visit (INDEPENDENT_AMBULATORY_CARE_PROVIDER_SITE_OTHER): Payer: 59 | Admitting: Obstetrics

## 2014-03-16 ENCOUNTER — Encounter: Payer: Self-pay | Admitting: Obstetrics

## 2014-03-16 ENCOUNTER — Other Ambulatory Visit (INDEPENDENT_AMBULATORY_CARE_PROVIDER_SITE_OTHER): Payer: 59

## 2014-03-16 VITALS — BP 127/75 | Temp 98.6°F | Wt 223.0 lb

## 2014-03-16 DIAGNOSIS — O365990 Maternal care for other known or suspected poor fetal growth, unspecified trimester, not applicable or unspecified: Secondary | ICD-10-CM

## 2014-03-16 DIAGNOSIS — Z3480 Encounter for supervision of other normal pregnancy, unspecified trimester: Secondary | ICD-10-CM

## 2014-03-16 LAB — US OB DETAIL + 14 WK

## 2014-03-16 LAB — POCT URINALYSIS DIPSTICK
BILIRUBIN UA: NEGATIVE
Blood, UA: NEGATIVE
Ketones, UA: NEGATIVE
NITRITE UA: NEGATIVE
Spec Grav, UA: 1.02
Urobilinogen, UA: NEGATIVE
pH, UA: 7

## 2014-03-16 LAB — URINE CULTURE

## 2014-03-16 LAB — BILE ACIDS, TOTAL: BILE ACIDS TOTAL: 12 umol/L (ref 0–19)

## 2014-03-16 NOTE — Progress Notes (Signed)
Pulse 97 Pt is doing well.  Pt states that she is craving paper, tissue.

## 2014-03-17 ENCOUNTER — Encounter: Payer: Self-pay | Admitting: Obstetrics & Gynecology

## 2014-03-17 DIAGNOSIS — O26649 Intrahepatic cholestasis of pregnancy, unspecified trimester: Secondary | ICD-10-CM | POA: Insufficient documentation

## 2014-03-17 DIAGNOSIS — O26619 Liver and biliary tract disorders in pregnancy, unspecified trimester: Secondary | ICD-10-CM

## 2014-03-17 DIAGNOSIS — K831 Obstruction of bile duct: Secondary | ICD-10-CM | POA: Insufficient documentation

## 2014-03-17 NOTE — Progress Notes (Signed)
Dr. Tamela OddiJackson-Moore notified regarding bile acids (12).

## 2014-03-21 ENCOUNTER — Encounter: Payer: Self-pay | Admitting: Obstetrics

## 2014-03-30 ENCOUNTER — Ambulatory Visit (INDEPENDENT_AMBULATORY_CARE_PROVIDER_SITE_OTHER): Payer: 59 | Admitting: Obstetrics

## 2014-03-30 VITALS — BP 121/72 | Temp 98.7°F | Wt 224.0 lb

## 2014-03-30 DIAGNOSIS — Z3480 Encounter for supervision of other normal pregnancy, unspecified trimester: Secondary | ICD-10-CM

## 2014-03-30 DIAGNOSIS — A6000 Herpesviral infection of urogenital system, unspecified: Secondary | ICD-10-CM

## 2014-03-30 MED ORDER — VALACYCLOVIR HCL 1 G PO TABS: ORAL_TABLET | ORAL | Status: DC

## 2014-03-30 NOTE — Progress Notes (Signed)
Pulse 98 Pt is doing well. 

## 2014-04-01 ENCOUNTER — Encounter: Payer: Self-pay | Admitting: Obstetrics

## 2014-04-01 LAB — STREP B DNA PROBE: GBSP: POSITIVE

## 2014-04-06 ENCOUNTER — Ambulatory Visit (INDEPENDENT_AMBULATORY_CARE_PROVIDER_SITE_OTHER): Payer: 59 | Admitting: Obstetrics

## 2014-04-06 VITALS — BP 123/72 | HR 85 | Temp 98.4°F | Wt 224.0 lb

## 2014-04-06 DIAGNOSIS — Z3480 Encounter for supervision of other normal pregnancy, unspecified trimester: Secondary | ICD-10-CM

## 2014-04-06 LAB — POCT URINALYSIS DIPSTICK
BILIRUBIN UA: NEGATIVE
Blood, UA: NEGATIVE
Ketones, UA: NEGATIVE
Leukocytes, UA: NEGATIVE
Nitrite, UA: NEGATIVE
Spec Grav, UA: 1.01
Urobilinogen, UA: NEGATIVE
pH, UA: 8

## 2014-04-07 ENCOUNTER — Encounter: Payer: Self-pay | Admitting: Obstetrics

## 2014-04-07 NOTE — Progress Notes (Signed)
Subjective:    Jessica Vasquez is a 23 y.o. female being seen today for her obstetrical visit. She is at 5542w0d gestation. Patient reports heartburn, no bleeding, no leaking, occasional contractions and pressure. Fetal movement: normal.  Past Medical History  Diagnosis Date  . No pertinent past medical history   . Anemia   . Herpes     last outbreak two weeks ago ( mid march 2015)    Past Surgical History  Procedure Laterality Date  . Nasal sugery      Current outpatient prescriptions:nitrofurantoin, macrocrystal-monohydrate, (MACROBID) 100 MG capsule, Take 1 capsule (100 mg total) by mouth 2 (two) times daily., Disp: 14 capsule, Rfl: 0;  Prenatal Vit-Fe Fumarate-FA (MULTIVITAMIN-PRENATAL) 27-0.8 MG TABS tablet, Take 1 tablet by mouth daily at 12 noon., Disp: , Rfl: ;  valACYclovir (VALTREX) 1000 MG tablet, Take 1 tablet by mouth daily for suppression., Disp: 30 tablet, Rfl: prn No Known Allergies  History  Substance Use Topics  . Smoking status: Former Smoker    Quit date: 05/16/2011  . Smokeless tobacco: Never Used  . Alcohol Use: No    Family History  Problem Relation Age of Onset  . Gallstones Mother      Review of Systems Constitutional: negative for anorexia Gastrointestinal: negative for abdominal pain Genitourinary:negative for vaginal discharge Musculoskeletal:negative for back pain Behavioral/Psych: negative for depression and tobacco use   Objective:    BP 123/72  Pulse 85  Temp(Src) 98.4 F (36.9 C)  Wt 224 lb (101.606 kg)  LMP 06/16/2013 FHT: 150 BPM  Uterine Size: size equals dates  Presentations: unsure  Pelvic Exam: Deferred                         Assessment:    Pregnancy @ 2242w0d weeks   Plan:   Plans for delivery: Vaginal anticipated; labs reviewed; problem list updated Counseling: Consent signed. Infant feeding: plans to breastfeed. Cigarette smoking: quit date 05-16-11. L&D discussion: symptoms of labor, discussed when to call, discussed  what number to call, anesthetic/analgesic options reviewed and delivering clinician:  plans Physician. Postpartum supports and preparation: circumcision discussed and contraception plans discussed.  Follow up in 1 Week.

## 2014-04-14 ENCOUNTER — Encounter: Payer: 59 | Admitting: Obstetrics

## 2014-04-16 ENCOUNTER — Ambulatory Visit (INDEPENDENT_AMBULATORY_CARE_PROVIDER_SITE_OTHER): Payer: 59 | Admitting: Obstetrics

## 2014-04-16 ENCOUNTER — Encounter: Payer: Self-pay | Admitting: Obstetrics

## 2014-04-16 ENCOUNTER — Encounter: Payer: 59 | Admitting: Obstetrics

## 2014-04-16 VITALS — BP 109/74 | HR 99 | Temp 97.7°F | Wt 225.0 lb

## 2014-04-16 DIAGNOSIS — Z3480 Encounter for supervision of other normal pregnancy, unspecified trimester: Secondary | ICD-10-CM

## 2014-04-16 LAB — POCT URINALYSIS DIPSTICK
Blood, UA: NEGATIVE
Glucose, UA: NEGATIVE
Ketones, UA: NEGATIVE
Leukocytes, UA: NEGATIVE
Nitrite, UA: NEGATIVE
PH UA: 6
Protein, UA: NEGATIVE
Spec Grav, UA: 1.01

## 2014-04-16 NOTE — Progress Notes (Signed)
Subjective:    Jessica Vasquez is a 23 y.o. female being seen today for her obstetrical visit. She is at 2136w2d gestation. Patient reports no complaints. Fetal movement: normal.  Problem List Items Addressed This Visit   Supervision of other normal pregnancy - Primary   Relevant Orders      POCT urinalysis dipstick (Completed)     Patient Active Problem List   Diagnosis Date Noted  . Cholestasis of pregnancy 03/17/2014  . BV (bacterial vaginosis) 11/17/2013  . Supervision of other normal pregnancy 10/20/2013  . Anemia 10/20/2013  . 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 Chronic cholecystitis with calculus 12/29/2012    Objective:    BP 109/74  Pulse 99  Temp(Src) 97.7 F (36.5 C)  Wt 225 lb (102.059 kg)  LMP 06/16/2013 FHT: 150 BPM  Uterine Size: size equals dates  Presentations: unsure  Pelvic Exam: Deferred    Assessment:    Pregnancy @ 8536w2d weeks   Plan:   Plans for delivery: Vaginal anticipated; labs reviewed; problem list updated Counseling: Consent signed. Infant feeding: plans to breastfeed. Cigarette smoking: quit date 2012. L&D discussion: symptoms of labor, discussed when to call, discussed what number to call, anesthetic/analgesic options reviewed and delivering clinician:  plans Physician. Postpartum supports and preparation: circumcision discussed and contraception plans discussed.  Follow up in 1 Week.

## 2014-04-20 ENCOUNTER — Encounter: Payer: Self-pay | Admitting: Obstetrics

## 2014-04-20 ENCOUNTER — Ambulatory Visit (INDEPENDENT_AMBULATORY_CARE_PROVIDER_SITE_OTHER): Payer: 59 | Admitting: Obstetrics

## 2014-04-20 VITALS — BP 123/75 | HR 93 | Temp 98.3°F | Wt 222.0 lb

## 2014-04-20 DIAGNOSIS — Z3480 Encounter for supervision of other normal pregnancy, unspecified trimester: Secondary | ICD-10-CM

## 2014-04-20 LAB — POCT URINALYSIS DIPSTICK
Bilirubin, UA: NEGATIVE
GLUCOSE UA: NEGATIVE
Ketones, UA: NEGATIVE
Nitrite, UA: NEGATIVE
PH UA: 7
RBC UA: NEGATIVE
Spec Grav, UA: 1.01
UROBILINOGEN UA: NEGATIVE

## 2014-04-20 NOTE — Progress Notes (Signed)
Subjective:    Jessica Vasquez is a 23 y.o. female being seen today for her obstetrical visit. She is at 5497w6d gestation. Patient reports no complaints. Fetal movement: normal.  Problem List Items Addressed This Visit   Supervision of other normal pregnancy - Primary   Relevant Orders      POCT urinalysis dipstick     Patient Active Problem List   Diagnosis Date Noted  . Cholestasis of pregnancy 03/17/2014  . BV (bacterial vaginosis) 11/17/2013  . Supervision of other normal pregnancy 10/20/2013  . Anemia 10/20/2013  . 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 Chronic cholecystitis with calculus 12/29/2012    Objective:    BP 123/75  Pulse 93  Temp(Src) 98.3 F (36.8 C)  Wt 222 lb (100.699 kg)  LMP 06/16/2013 FHT: 150 BPM  Uterine Size: size equals dates  Presentations: cephalic  Pelvic Exam: Deferred        Assessment:    Pregnancy @ 6497w6d weeks   Plan:   Plans for delivery: Vaginal anticipated; labs reviewed; problem list updated Counseling: Consent signed. Infant feeding: plans to breastfeed. Cigarette smoking: quit May 2012. L&D discussion: symptoms of labor, discussed when to call, discussed what number to call, anesthetic/analgesic options reviewed and delivering clinician:  plans Physician. Postpartum supports and preparation: circumcision discussed and contraception plans discussed.  Follow up in 1 Week.

## 2014-04-25 ENCOUNTER — Inpatient Hospital Stay (HOSPITAL_COMMUNITY)
Admission: AD | Admit: 2014-04-25 | Discharge: 2014-04-25 | Disposition: A | Discharge status: Home / Self Care | Payer: 59 | Source: Ambulatory Visit | Attending: Obstetrics | Admitting: Obstetrics

## 2014-04-25 ENCOUNTER — Encounter (HOSPITAL_COMMUNITY): Payer: Self-pay | Admitting: *Deleted

## 2014-04-25 DIAGNOSIS — B009 Herpesviral infection, unspecified: Secondary | ICD-10-CM

## 2014-04-25 DIAGNOSIS — K831 Obstruction of bile duct: Secondary | ICD-10-CM

## 2014-04-25 DIAGNOSIS — O26619 Liver and biliary tract disorders in pregnancy, unspecified trimester: Secondary | ICD-10-CM

## 2014-04-25 DIAGNOSIS — O26859 Spotting complicating pregnancy, unspecified trimester: Secondary | ICD-10-CM | POA: Insufficient documentation

## 2014-04-25 DIAGNOSIS — Z3480 Encounter for supervision of other normal pregnancy, unspecified trimester: Secondary | ICD-10-CM

## 2014-04-25 DIAGNOSIS — O368190 Decreased fetal movements, unspecified trimester, not applicable or unspecified: Secondary | ICD-10-CM | POA: Insufficient documentation

## 2014-04-25 DIAGNOSIS — R109 Unspecified abdominal pain: Secondary | ICD-10-CM | POA: Insufficient documentation

## 2014-04-25 DIAGNOSIS — D649 Anemia, unspecified: Secondary | ICD-10-CM

## 2014-04-25 LAB — URINE MICROSCOPIC-ADD ON

## 2014-04-25 LAB — URINALYSIS, ROUTINE W REFLEX MICROSCOPIC
BILIRUBIN URINE: NEGATIVE
Glucose, UA: NEGATIVE mg/dL
Ketones, ur: NEGATIVE mg/dL
Nitrite: NEGATIVE
PROTEIN: NEGATIVE mg/dL
Specific Gravity, Urine: 1.01 (ref 1.005–1.030)
UROBILINOGEN UA: 0.2 mg/dL (ref 0.0–1.0)
pH: 6.5 (ref 5.0–8.0)

## 2014-04-25 NOTE — MAU Note (Signed)
C/o spotting since 2330 last night; c/o cramping asince 2300 and decreased fetal movement @ 2300;

## 2014-04-26 ENCOUNTER — Encounter (HOSPITAL_COMMUNITY): Payer: 59 | Admitting: Anesthesiology

## 2014-04-26 ENCOUNTER — Inpatient Hospital Stay (HOSPITAL_COMMUNITY)
Admission: AD | Admit: 2014-04-26 | Discharge: 2014-04-29 | DRG: 774 | Disposition: A | Discharge status: Home / Self Care | Payer: 59 | Source: Ambulatory Visit | Attending: Obstetrics | Admitting: Obstetrics

## 2014-04-26 ENCOUNTER — Encounter (HOSPITAL_COMMUNITY): Payer: Self-pay | Admitting: *Deleted

## 2014-04-26 ENCOUNTER — Inpatient Hospital Stay (HOSPITAL_COMMUNITY): Payer: 59 | Admitting: Anesthesiology

## 2014-04-26 DIAGNOSIS — O98519 Other viral diseases complicating pregnancy, unspecified trimester: Principal | ICD-10-CM | POA: Diagnosis present

## 2014-04-26 DIAGNOSIS — O99892 Other specified diseases and conditions complicating childbirth: Secondary | ICD-10-CM | POA: Diagnosis present

## 2014-04-26 DIAGNOSIS — Z3480 Encounter for supervision of other normal pregnancy, unspecified trimester: Secondary | ICD-10-CM

## 2014-04-26 DIAGNOSIS — Z22330 Carrier of Group B streptococcus: Secondary | ICD-10-CM

## 2014-04-26 DIAGNOSIS — K831 Obstruction of bile duct: Secondary | ICD-10-CM

## 2014-04-26 DIAGNOSIS — D649 Anemia, unspecified: Secondary | ICD-10-CM | POA: Diagnosis present

## 2014-04-26 DIAGNOSIS — O26619 Liver and biliary tract disorders in pregnancy, unspecified trimester: Secondary | ICD-10-CM

## 2014-04-26 DIAGNOSIS — A6000 Herpesviral infection of urogenital system, unspecified: Secondary | ICD-10-CM | POA: Diagnosis present

## 2014-04-26 DIAGNOSIS — O9902 Anemia complicating childbirth: Secondary | ICD-10-CM | POA: Diagnosis present

## 2014-04-26 DIAGNOSIS — O9989 Other specified diseases and conditions complicating pregnancy, childbirth and the puerperium: Secondary | ICD-10-CM

## 2014-04-26 DIAGNOSIS — B009 Herpesviral infection, unspecified: Secondary | ICD-10-CM

## 2014-04-26 LAB — CBC
HCT: 29.5 % — ABNORMAL LOW (ref 36.0–46.0)
HEMOGLOBIN: 9.8 g/dL — AB (ref 12.0–15.0)
MCH: 28.2 pg (ref 26.0–34.0)
MCHC: 33.2 g/dL (ref 30.0–36.0)
MCV: 85 fL (ref 78.0–100.0)
Platelets: 187 10*3/uL (ref 150–400)
RBC: 3.47 MIL/uL — AB (ref 3.87–5.11)
RDW: 14.1 % (ref 11.5–15.5)
WBC: 9.6 10*3/uL (ref 4.0–10.5)

## 2014-04-26 LAB — POCT FERN TEST: POCT Fern Test: POSITIVE

## 2014-04-26 MED ORDER — PHENYLEPHRINE 40 MCG/ML (10ML) SYRINGE FOR IV PUSH (FOR BLOOD PRESSURE SUPPORT)
80.0000 ug | PREFILLED_SYRINGE | INTRAVENOUS | Status: DC | PRN
  Filled 2014-04-26: qty 2

## 2014-04-26 MED ORDER — DEXTROSE 5 % IV SOLN
5.0000 10*6.[IU] | Freq: Once | INTRAVENOUS | Status: DC
  Filled 2014-04-26: qty 5

## 2014-04-26 MED ORDER — DIPHENHYDRAMINE HCL 50 MG/ML IJ SOLN
12.5000 mg | INTRAMUSCULAR | Status: DC | PRN
  Administered 2014-04-27 (×2): 12.5 mg via INTRAVENOUS
  Filled 2014-04-26 (×2): qty 1

## 2014-04-26 MED ORDER — OXYTOCIN BOLUS FROM INFUSION
500.0000 mL | INTRAVENOUS | Status: DC
  Administered 2014-04-27: 500 mL via INTRAVENOUS

## 2014-04-26 MED ORDER — LACTATED RINGERS IV SOLN: 500.0000 mL | INTRAVENOUS | Status: DC | PRN

## 2014-04-26 MED ORDER — OXYTOCIN 40 UNITS IN LACTATED RINGERS INFUSION - SIMPLE MED
62.5000 mL/h | INTRAVENOUS | Status: DC
  Administered 2014-04-27: 62.5 mL/h via INTRAVENOUS

## 2014-04-26 MED ORDER — FENTANYL 2.5 MCG/ML BUPIVACAINE 1/10 % EPIDURAL INFUSION (WH - ANES)
14.0000 mL/h | INTRAMUSCULAR | Status: DC | PRN
  Administered 2014-04-26: 14 mL/h via EPIDURAL
  Filled 2014-04-26: qty 125

## 2014-04-26 MED ORDER — ONDANSETRON HCL 4 MG/2ML IJ SOLN: 4.0000 mg | Freq: Four times a day (QID) | INTRAMUSCULAR | Status: DC | PRN

## 2014-04-26 MED ORDER — IBUPROFEN 600 MG PO TABS: 600.0000 mg | ORAL_TABLET | Freq: Four times a day (QID) | ORAL | Status: DC | PRN

## 2014-04-26 MED ORDER — OXYTOCIN 40 UNITS IN LACTATED RINGERS INFUSION - SIMPLE MED
1.0000 m[IU]/min | INTRAVENOUS | Status: DC
  Administered 2014-04-26: 2 m[IU]/min via INTRAVENOUS
  Filled 2014-04-26 (×2): qty 1000

## 2014-04-26 MED ORDER — SODIUM CHLORIDE 0.9 % IV SOLN
2.0000 g | Freq: Once | INTRAVENOUS | Status: AC
  Administered 2014-04-26: 2 g via INTRAVENOUS
  Filled 2014-04-26: qty 2000

## 2014-04-26 MED ORDER — TERBUTALINE SULFATE 1 MG/ML IJ SOLN
0.2500 mg | Freq: Once | INTRAMUSCULAR | Status: AC | PRN
Start: 2014-04-26 — End: 2014-04-26

## 2014-04-26 MED ORDER — EPHEDRINE 5 MG/ML INJ
10.0000 mg | INTRAVENOUS | Status: DC | PRN
  Filled 2014-04-26: qty 2

## 2014-04-26 MED ORDER — EPHEDRINE 5 MG/ML INJ
10.0000 mg | INTRAVENOUS | Status: DC | PRN
  Filled 2014-04-26: qty 4
  Filled 2014-04-26: qty 2

## 2014-04-26 MED ORDER — LACTATED RINGERS IV SOLN
INTRAVENOUS | Status: DC
  Administered 2014-04-26 – 2014-04-27 (×2): via INTRAVENOUS

## 2014-04-26 MED ORDER — FLEET ENEMA 7-19 GM/118ML RE ENEM
1.0000 | ENEMA | RECTAL | Status: DC | PRN
Start: 2014-04-26 — End: 2014-04-27

## 2014-04-26 MED ORDER — BUTORPHANOL TARTRATE 1 MG/ML IJ SOLN: 1.0000 mg | INTRAMUSCULAR | Status: DC | PRN

## 2014-04-26 MED ORDER — PHENYLEPHRINE 40 MCG/ML (10ML) SYRINGE FOR IV PUSH (FOR BLOOD PRESSURE SUPPORT)
80.0000 ug | PREFILLED_SYRINGE | INTRAVENOUS | Status: DC | PRN
  Filled 2014-04-26: qty 2
  Filled 2014-04-26: qty 10

## 2014-04-26 MED ORDER — ACETAMINOPHEN 325 MG PO TABS: 650.0000 mg | ORAL_TABLET | ORAL | Status: DC | PRN

## 2014-04-26 MED ORDER — VALACYCLOVIR HCL 500 MG PO TABS
500.0000 mg | ORAL_TABLET | Freq: Every day | ORAL | Status: DC
  Administered 2014-04-26: 500 mg via ORAL
  Filled 2014-04-26 (×2): qty 1

## 2014-04-26 MED ORDER — CITRIC ACID-SODIUM CITRATE 334-500 MG/5ML PO SOLN: 30.0000 mL | ORAL | Status: DC | PRN

## 2014-04-26 MED ORDER — DEXTROSE 5 % IV SOLN
2.5000 10*6.[IU] | INTRAVENOUS | Status: DC
  Filled 2014-04-26 (×2): qty 2.5

## 2014-04-26 MED ORDER — LIDOCAINE HCL (PF) 1 % IJ SOLN
30.0000 mL | INTRAMUSCULAR | Status: AC | PRN
  Administered 2014-04-26 (×2): 5 mL via SUBCUTANEOUS

## 2014-04-26 MED ORDER — LACTATED RINGERS IV SOLN
500.0000 mL | Freq: Once | INTRAVENOUS | Status: AC
  Administered 2014-04-26: 500 mL via INTRAVENOUS

## 2014-04-26 MED ORDER — OXYCODONE-ACETAMINOPHEN 5-325 MG PO TABS: 1.0000 | ORAL_TABLET | ORAL | Status: DC | PRN

## 2014-04-26 NOTE — H&P (Signed)
This is Dr. Francoise CeoBernard Elnathan Fulford dictating the history and physical on  Jessica Vasquez she's a 23 year old gravida 2 para 1001 positive GBS receiving penicillin she is a history of herpes and she is a Trex 500 by mouth daily she has not had a recent lesion she's 39 weeks and 5 days her membranes ruptured at 532 she is now 6 cm vertex presentation and irregular contractions Past medical history negative Past surgical history negative Social history negative System review noncontributory Physical well-developed female in no distress HEENT negative Lungs clear to P&A Heart regular rhythm no murmurs no gallops Breasts negative Abdomen term Pelvic as described above Extremities negative

## 2014-04-26 NOTE — Anesthesia Procedure Notes (Signed)
Epidural Patient location during procedure: OB Start time: 04/26/2014 9:27 PM End time: 04/26/2014 9:39 PM  Staffing Anesthesiologist: Muskaan Smet, CHRIS Performed by: anesthesiologist   Preanesthetic Checklist Completed: patient identified, surgical consent, pre-op evaluation, timeout performed, IV checked, risks and benefits discussed and monitors and equipment checked  Epidural Patient position: sitting Prep: site prepped and draped and DuraPrep Patient monitoring: heart rate, cardiac monitor, continuous pulse ox and blood pressure Approach: midline Location: L3-L4 Injection technique: LOR saline  Needle:  Needle type: Tuohy  Needle gauge: 17 G Needle length: 9 cm Needle insertion depth: 6 cm Catheter type: closed end flexible Catheter size: 19 Gauge Catheter at skin depth: 12 cm Test dose: Other  Assessment Events: blood not aspirated, injection not painful, no injection resistance, negative IV test and no paresthesia  Additional Notes H+P and labs checked, risks and benefits discussed with the patient, consent obtained, procedure tolerated well and without complications.  Reason for block:procedure for pain

## 2014-04-26 NOTE — MAU Note (Signed)
Patient reports to MAU with c/o leaking clear fluid that started at 1700 today. Denies VB, or contractions at this time. Reports good fetal movement. Yesterday in office patient reports a cervical exam of 2-3 cm.

## 2014-04-26 NOTE — Anesthesia Preprocedure Evaluation (Signed)
Anesthesia Evaluation  Patient identified by MRN, date of birth, ID band Patient awake    Reviewed: Allergy & Precautions  History of Anesthesia Complications (+) history of anesthetic complications  Airway Mallampati: II TM Distance: >3 FB Neck ROM: Full    Dental  (+) Teeth Intact   Pulmonary neg sleep apnea, neg COPDformer smoker,          Cardiovascular negative cardio ROS  Rhythm:Regular     Neuro/Psych negative neurological ROS  negative psych ROS   GI/Hepatic negative GI ROS, Neg liver ROS,   Endo/Other  negative endocrine ROS  Renal/GU negative Renal ROS     Musculoskeletal   Abdominal   Peds  Hematology  (+) anemia ,   Anesthesia Other Findings   Reproductive/Obstetrics (+) Pregnancy                           Anesthesia Physical Anesthesia Plan  ASA: II  Anesthesia Plan: Epidural   Post-op Pain Management:    Induction:   Airway Management Planned:   Additional Equipment: None  Intra-op Plan:   Post-operative Plan:   Informed Consent: I have reviewed the patients History and Physical, chart, labs and discussed the procedure including the risks, benefits and alternatives for the proposed anesthesia with the patient or authorized representative who has indicated his/her understanding and acceptance.   Dental advisory given  Plan Discussed with: Anesthesiologist  Anesthesia Plan Comments:         Anesthesia Quick Evaluation

## 2014-04-27 ENCOUNTER — Encounter (HOSPITAL_COMMUNITY): Payer: Self-pay | Admitting: *Deleted

## 2014-04-27 ENCOUNTER — Encounter: Payer: 59 | Admitting: Obstetrics

## 2014-04-27 LAB — CBC
HCT: 29.9 % — ABNORMAL LOW (ref 36.0–46.0)
Hemoglobin: 10 g/dL — ABNORMAL LOW (ref 12.0–15.0)
MCH: 28.4 pg (ref 26.0–34.0)
MCHC: 33.4 g/dL (ref 30.0–36.0)
MCV: 84.9 fL (ref 78.0–100.0)
Platelets: 167 10*3/uL (ref 150–400)
RBC: 3.52 MIL/uL — ABNORMAL LOW (ref 3.87–5.11)
RDW: 14 % (ref 11.5–15.5)
WBC: 13.9 10*3/uL — ABNORMAL HIGH (ref 4.0–10.5)

## 2014-04-27 LAB — RPR

## 2014-04-27 MED ORDER — ZOLPIDEM TARTRATE 5 MG PO TABS: 5.0000 mg | ORAL_TABLET | Freq: Every evening | ORAL | Status: DC | PRN

## 2014-04-27 MED ORDER — LIDOCAINE HCL (PF) 1 % IJ SOLN
30.0000 mL | INTRAMUSCULAR | Status: DC | PRN
  Filled 2014-04-27: qty 30

## 2014-04-27 MED ORDER — LIDOCAINE HCL (PF) 1 % IJ SOLN
INTRAMUSCULAR | Status: AC
  Filled 2014-04-27: qty 30

## 2014-04-27 MED ORDER — ONDANSETRON HCL 4 MG/2ML IJ SOLN: 4.0000 mg | INTRAMUSCULAR | Status: DC | PRN

## 2014-04-27 MED ORDER — FERROUS SULFATE 325 (65 FE) MG PO TABS
325.0000 mg | ORAL_TABLET | Freq: Two times a day (BID) | ORAL | Status: DC
  Administered 2014-04-27 – 2014-04-29 (×5): 325 mg via ORAL
  Filled 2014-04-27 (×5): qty 1

## 2014-04-27 MED ORDER — WITCH HAZEL-GLYCERIN EX PADS: 1.0000 "application " | MEDICATED_PAD | CUTANEOUS | Status: DC | PRN

## 2014-04-27 MED ORDER — DIPHENHYDRAMINE HCL 25 MG PO CAPS: 25.0000 mg | ORAL_CAPSULE | Freq: Four times a day (QID) | ORAL | Status: DC | PRN

## 2014-04-27 MED ORDER — SIMETHICONE 80 MG PO CHEW: 80.0000 mg | CHEWABLE_TABLET | ORAL | Status: DC | PRN

## 2014-04-27 MED ORDER — LANOLIN HYDROUS EX OINT: TOPICAL_OINTMENT | CUTANEOUS | Status: DC | PRN

## 2014-04-27 MED ORDER — PRENATAL MULTIVITAMIN CH
1.0000 | ORAL_TABLET | Freq: Every day | ORAL | Status: DC
  Administered 2014-04-27: 1 via ORAL
  Filled 2014-04-27: qty 1

## 2014-04-27 MED ORDER — IBUPROFEN 600 MG PO TABS
600.0000 mg | ORAL_TABLET | Freq: Four times a day (QID) | ORAL | Status: DC
  Administered 2014-04-27 – 2014-04-28 (×7): 600 mg via ORAL
  Filled 2014-04-27 (×8): qty 1

## 2014-04-27 MED ORDER — SENNOSIDES-DOCUSATE SODIUM 8.6-50 MG PO TABS
2.0000 | ORAL_TABLET | ORAL | Status: DC
  Administered 2014-04-27 – 2014-04-28 (×2): 2 via ORAL
  Filled 2014-04-27 (×2): qty 2

## 2014-04-27 MED ORDER — BENZOCAINE-MENTHOL 20-0.5 % EX AERO
1.0000 "application " | INHALATION_SPRAY | CUTANEOUS | Status: DC | PRN
  Filled 2014-04-27: qty 56

## 2014-04-27 MED ORDER — DIBUCAINE 1 % RE OINT: 1.0000 "application " | TOPICAL_OINTMENT | RECTAL | Status: DC | PRN

## 2014-04-27 MED ORDER — ONDANSETRON HCL 4 MG PO TABS: 4.0000 mg | ORAL_TABLET | ORAL | Status: DC | PRN

## 2014-04-27 MED ORDER — OXYCODONE-ACETAMINOPHEN 5-325 MG PO TABS: 1.0000 | ORAL_TABLET | ORAL | Status: DC | PRN

## 2014-04-27 MED ORDER — TETANUS-DIPHTH-ACELL PERTUSSIS 5-2.5-18.5 LF-MCG/0.5 IM SUSP
0.5000 mL | Freq: Once | INTRAMUSCULAR | Status: AC
Start: 2014-04-28 — End: 2014-04-27
  Administered 2014-04-27: 0.5 mL via INTRAMUSCULAR

## 2014-04-27 NOTE — Anesthesia Postprocedure Evaluation (Signed)
Anesthesia Post Note  Patient: Jessica Vasquez  Procedure(s) Performed: * No procedures listed *  Anesthesia type: Epidural  Patient location: Mother/Baby  Post pain: Pain level controlled  Post assessment: Post-op Vital signs reviewed  Last Vitals:  Filed Vitals:   04/27/14 0640  BP: 105/63  Pulse: 78  Temp: 36.9 C  Resp: 18    Post vital signs: Reviewed  Level of consciousness:alert  Complications: No apparent anesthesia complications

## 2014-04-27 NOTE — Progress Notes (Signed)
Post Partum Day 0 Subjective: no complaints  Objective: Blood pressure 105/63, pulse 78, temperature 98.5 F (36.9 C), temperature source Oral, resp. rate 18, height 5\' 10"  (1.778 m), weight 222 lb (100.699 kg), last menstrual period 06/16/2013, SpO2 100.00%, unknown if currently breastfeeding.  Physical Exam:  General: alert and no distress Lochia: appropriate Uterine Fundus: firm Incision: none DVT Evaluation: No evidence of DVT seen on physical exam.   Recent Labs  04/26/14 2015 04/27/14 0700  HGB 9.8* 10.0*  HCT 29.5* 29.9*    Assessment/Plan: Plan for discharge tomorrow   LOS: 1 day   Brock Badharles A Harper 04/27/2014, 8:28 AM

## 2014-04-28 NOTE — Progress Notes (Signed)
Post Partum Day 1 Subjective: no complaints  Objective: Blood pressure 119/55, pulse 92, temperature 98.1 F (36.7 C), temperature source Oral, resp. rate 18, height 5\' 10"  (1.778 m), weight 222 lb (100.699 kg), last menstrual period 06/16/2013, SpO2 100.00%, unknown if currently breastfeeding.  Physical Exam:  General: alert and no distress Lochia: appropriate Uterine Fundus: firm Incision: none DVT Evaluation: No evidence of DVT seen on physical exam.   Recent Labs  04/26/14 2015 04/27/14 0700  HGB 9.8* 10.0*  HCT 29.5* 29.9*    Assessment/Plan: Plan for discharge tomorrow   LOS: 2 days   Jessica Vasquez 04/28/2014, 8:09 AM

## 2014-04-28 NOTE — Lactation Note (Signed)
This note was copied from the chart of Jessica Vasquez [place]. Lactation Consultation Note  Patient Name: Jessica Vasquez Rogerson [id number]'s Date: 04/28/2014 Reason for consult: Follow-up assessment (per mom [facility] , seem sleepy , per mom last fed at 1530, breast and bottle ) Mom to sleepy to obtain clear information of the feeding at this time. LC informed [facility] RN , Wallie Renshawatalie Branch to check back with mom when she is more awake . Per [facility] RN , mom has been doing both breast and formula.    Maternal Data    Feeding Feeding Type:  (per mom last fed at 1530 breast and then bottle )  LATCH Score/Interventions                      Lactation Tools Discussed/Used     Consult Status Consult Status: Follow-up Date: 04/29/14 Follow-up type: In-patient    Matilde SprangMargaret Ann Melquiades Kovar 04/28/2014, 5:07 PM

## 2014-04-29 NOTE — Discharge Summary (Signed)
  Obstetric Discharge Summary Reason for Admission: onset of labor Prenatal Procedures: none Intrapartum Procedures: spontaneous vaginal delivery Postpartum Procedures: none Complications-Operative and Postpartum: none  Hemoglobin  Date Value Ref Range Status  04/27/2014 10.0* 12.0 - 15.0 g/dL Final     HCT  Date Value Ref Range Status  04/27/2014 29.9* 36.0 - 46.0 % Final    Physical Exam:  General: alert Lochia: appropriate Uterine: firm Incision: n/a DVT Evaluation: No evidence of DVT seen on physical exam.  Discharge Diagnoses: Active Problems:   Indication for care in labor or delivery   NVD (normal vaginal delivery)   Discharge Information: Date: 04/29/2014 Activity: pelvic rest Diet: routine Medications:  Prior to Admission medications   Not on File    Condition: stable Instructions: refer to routine discharge instructions Discharge to: home Follow-up Information   Follow up with HARPER,CHARLES A, MD. Schedule an appointment as soon as possible for a visit in 2 weeks. (Call to schedule circumcision)    Specialty:  Obstetrics and Gynecology   Contact information:   [address] [place] [id number] [phone]       Newborn Data:  Live born female  Birth Weight: 7 lb 13 oz (3544 g) APGAR: 8, 9   Home with mother.  Jessica Vasquez 04/29/2014, 6:45 AM

## 2014-04-29 NOTE — Discharge Instructions (Signed)

## 2014-05-19 ENCOUNTER — Ambulatory Visit: Payer: 59 | Admitting: Obstetrics

## 2014-05-24 ENCOUNTER — Encounter: Payer: Self-pay | Admitting: Obstetrics

## 2014-05-24 ENCOUNTER — Ambulatory Visit (INDEPENDENT_AMBULATORY_CARE_PROVIDER_SITE_OTHER): Payer: 59 | Admitting: Obstetrics

## 2014-05-24 ENCOUNTER — Ambulatory Visit: Payer: 59 | Admitting: Obstetrics

## 2014-05-24 NOTE — Progress Notes (Signed)
Subjective:     Jessica Vasquez is a 23 y.o. female who presents for a postpartum visit. She is 4 weeks postpartum following a spontaneous vaginal delivery. I have fully reviewed the prenatal and intrapartum course. The delivery was at 39 gestational weeks. Outcome: spontaneous vaginal delivery. Anesthesia: epidural. Postpartum course has been normal. Baby's course has been normal. Baby is feeding by breast. Bleeding no bleeding. Bowel function is normal. Bladder function is normal. Patient is not sexually active. Contraception method is abstinence. Postpartum depression screening: negative.  The following portions of the patient's history were reviewed and updated as appropriate: allergies, current medications, past family history, past medical history, past social history, past surgical history and problem list.  Review of Systems A comprehensive review of systems was negative.   Objective:    BP 114/76  Pulse 75  Temp(Src) 98.9 F (37.2 C)  Ht 5\' 10"  (1.778 m)  Wt 213 lb (96.616 kg)  BMI 30.56 kg/m2  Breastfeeding? Yes  General:  alert and no distress   Breasts:  inspection negative, no nipple discharge or bleeding, no masses or nodularity palpable  Lungs: clear to auscultation bilaterally  Heart:  regular rate and rhythm, S1, S2 normal, no murmur, click, rub or gallop  Abdomen: normal findings: soft, non-tender   Vulva:  normal  Vagina: normal vagina  Cervix:  no lesions  Corpus: normal size, contour, position, consistency, mobility, non-tender  Adnexa:  no mass, fullness, tenderness  Rectal Exam: Not performed.         Assessment:     Normal postpartum exam. Pap smear not done at today's visit.   Plan:    1. Contraception: abstinence 2. Contraceptive options discussed 3. Follow up in: 4 weeks or as needed.

## 2014-06-08 ENCOUNTER — Ambulatory Visit: Payer: Self-pay | Admitting: Obstetrics

## 2014-06-20 ENCOUNTER — Emergency Department (HOSPITAL_COMMUNITY)
Admission: EM | Admit: 2014-06-20 | Discharge: 2014-06-20 | Disposition: A | Discharge status: Home / Self Care | Payer: 59 | Attending: Emergency Medicine | Admitting: Emergency Medicine

## 2014-06-20 ENCOUNTER — Encounter (HOSPITAL_COMMUNITY): Payer: Self-pay | Admitting: Emergency Medicine

## 2014-06-20 DIAGNOSIS — IMO0002 Reserved for concepts with insufficient information to code with codable children: Secondary | ICD-10-CM | POA: Insufficient documentation

## 2014-06-20 DIAGNOSIS — L02412 Cutaneous abscess of left axilla: Secondary | ICD-10-CM

## 2014-06-20 DIAGNOSIS — Z87891 Personal history of nicotine dependence: Secondary | ICD-10-CM | POA: Insufficient documentation

## 2014-06-20 DIAGNOSIS — Z862 Personal history of diseases of the blood and blood-forming organs and certain disorders involving the immune mechanism: Secondary | ICD-10-CM | POA: Insufficient documentation

## 2014-06-20 DIAGNOSIS — Z8619 Personal history of other infectious and parasitic diseases: Secondary | ICD-10-CM | POA: Insufficient documentation

## 2014-06-20 MED ORDER — IBUPROFEN 800 MG PO TABS
800.0000 mg | ORAL_TABLET | Freq: Once | ORAL | Status: AC
  Administered 2014-06-20: 800 mg via ORAL
  Filled 2014-06-20: qty 1

## 2014-06-20 NOTE — Discharge Instructions (Signed)
Please follow up with your primary care physician in 1-2 days. If you do not have one please [facility] number listed above. Please alternate between Motrin and Tylenol every three hours for pain. Please keep him on warm soaks and compresses to the abscess. Please keep the area clean, dry and covered. Please read all discharge instructions and return precautions.    Abscess An abscess is an infected area that contains a collection of pus and debris.It can occur in almost any part of the body. An abscess is also known as a furuncle or boil. CAUSES  An abscess occurs when tissue gets infected. This can occur from blockage of oil or sweat glands, infection of hair follicles, or a minor injury to the skin. As the body tries to fight the infection, pus collects in the area and creates pressure under the skin. This pressure causes pain. People with weakened immune systems have difficulty fighting infections and get certain abscesses more often.  SYMPTOMS Usually an abscess develops on the skin and becomes a painful mass that is red, warm, and tender. If the abscess forms under the skin, you may feel a moveable soft area under the skin. Some abscesses break open (rupture) on their own, but most will continue to get worse without care. The infection can spread deeper into the body and eventually into the bloodstream, causing you to feel ill.  DIAGNOSIS  Your caregiver will take your medical history and perform a physical exam. A sample of fluid may also be taken from the abscess to determine what is causing your infection. TREATMENT  Your caregiver may prescribe antibiotic medicines to fight the infection. However, taking antibiotics alone usually does not cure an abscess. Your caregiver may need to make a small cut (incision) in the abscess to drain the pus. In some cases, gauze is packed into the abscess to reduce pain and to continue draining the area. HOME CARE INSTRUCTIONS     Only take over-the-counter or prescription medicines for pain, discomfort, or fever as directed by your caregiver.  If you were prescribed antibiotics, take them as directed. Finish them even if you start to feel better.  If gauze is used, follow your caregiver's directions for changing the gauze.  To avoid spreading the infection:  Keep your draining abscess covered with a bandage.  Wash your hands well.  Do not share personal care items, towels, or whirlpools with others.  Avoid skin contact with others.  Keep your skin and clothes clean around the abscess.  Keep all follow-up appointments as directed by your caregiver. SEEK MEDICAL CARE IF:   You have increased pain, swelling, redness, fluid drainage, or bleeding.  You have muscle aches, chills, or a general ill feeling.  You have a fever. MAKE SURE YOU:   Understand these instructions.  Will watch your condition.  Will get help right away if you are not doing well or get worse. Document Released: 09/12/2005 Document Revised: 06/03/2012 Document Reviewed: 02/15/2012 [facility] Patient Information 2015 [employer], [place]. This information is not intended to replace advice given to you by your health care provider. Make sure you discuss any questions you have with your health care provider. Hidradenitis Suppurativa, Sweat Gland Abscess Hidradenitis suppurativa is a long lasting (chronic), uncommon disease of the sweat glands. With this, boil-like lumps and scarring develop in the groin, some times under the arms (axillae), and under the breasts. It may also uncommonly occur behind the ears, in the crease of  the buttocks, and around the genitals.  CAUSES  The cause is from a blocking of the sweat glands. They then become infected. It may cause drainage and odor. It is not contagious. So it cannot be given to someone else. It most often shows up in puberty (about 1310 to 23 years of age). But it may happen much later. It is similar  to acne which is a disease of the sweat glands. This condition is slightly more common in African-Americans and women. SYMPTOMS   Hidradenitis usually starts as one or more red, tender, swellings in the groin or under the arms (axilla).  Over a period of hours to days the lesions get larger. They often open to the skin surface, draining clear to yellow-colored fluid.  The infected area heals with scarring. DIAGNOSIS  Your caregiver makes this diagnosis by looking at you. Sometimes cultures (growing germs on plates in the lab) may be taken. This is to see what germ (bacterium) is causing the infection.  TREATMENT   Topical germ killing medicine applied to the skin (antibiotics) are the treatment of choice. Antibiotics taken by mouth (systemic) are sometimes needed when the condition is getting worse or is severe.  Avoid tight-fitting clothing which traps moisture in.  Dirt does not cause hidradenitis and it is not caused by poor hygiene.  Involved areas should be cleaned daily using an antibacterial soap. Some patients find that the liquid form of Lever 2000, applied to the involved areas as a lotion after bathing, can help reduce the odor related to this condition.  Sometimes surgery is needed to drain infected areas or remove scarred tissue. Removal of large amounts of tissue is used only in severe cases.  Birth control pills may be helpful.  Oral retinoids (vitamin A derivatives) for 6 to 12 months which are effective for acne may also help this condition.  Weight loss will improve but not cure hidradenitis. It is made worse by being overweight. But the condition is not caused by being overweight.  This condition is more common in people who have had acne.  It may become worse under stress. There is no medical cure for hidradenitis. It can be controlled, but not cured. The condition usually continues for years with periods of getting worse and getting better (remission). Document  Released: 07/17/2004 Document Revised: 02/25/2012 Document Reviewed: 08/02/2008 [facility] Patient Information 2015 [place], [place]. This information is not intended to replace advice given to you by your health care provider. Make sure you discuss any questions you have with your health care provider.

## 2014-06-20 NOTE — ED Notes (Signed)
Patient with boils under both arms that started about one week ago.  Patient reports no drainage but swelling present.  Patient reports a pain level of 8 when she touches them.  Patient has had a history of these on other areas of body.

## 2014-06-20 NOTE — ED Provider Notes (Signed)
Medical screening examination/treatment/procedure(s) were performed by non-physician practitioner and as supervising physician I was immediately available for consultation/collaboration.   EKG Interpretation None       Raeford RazorStephen Broady Lafoy, MD 06/20/14 1251

## 2014-06-20 NOTE — ED Provider Notes (Signed)
CSN: [id number]     Arrival date & time 06/20/14  1109 History   First MD Initiated Contact with Patient 06/20/14 1112     Chief Complaint  Patient presents with  . Abscess     (Consider location/radiation/quality/duration/timing/severity/associated sxs/prior Treatment) Patient is a 23 y.o. female presenting with abscess. The history is provided by the patient.  Abscess Location:  Shoulder/arm Shoulder/arm abscess location:  L axilla Size:  < 0.5 cm diameter Abscess quality: draining and induration   Red streaking: no   Duration:  1 week Progression:  Improving Chronicity:  Recurrent Context: not diabetes, not immunosuppression, not injected drug use, not insect bite/sting and not skin injury   Relieved by:  Draining/squeezing, warm water soaks and warm compresses Worsened by:  Nothing tried Ineffective treatments:  None tried Associated symptoms: no anorexia, no fatigue, no fever, no headaches, no nausea and no vomiting   Risk factors: prior abscess   Risk factors: no family hx of MRSA and no hx of MRSA     Past Medical History  Diagnosis Date  . No pertinent past medical history   . Anemia   . Herpes     last outbreak two weeks ago ( mid march 2015)   Past Surgical History  Procedure Laterality Date  . Nasal sugery     Family History  Problem Relation Age of Onset  . Gallstones Mother    History  Substance Use Topics  . Smoking status: Former Smoker    Quit date: 05/16/2011  . Smokeless tobacco: Never Used  . Alcohol Use: No   OB History   Grav Para Term Preterm Abortions TAB SAB Ect Mult Living   2 2 2  0 0 0 0 0 0 2     Review of Systems  Constitutional: Negative for fever and fatigue.  Gastrointestinal: Negative for nausea, vomiting and anorexia.  Neurological: Negative for headaches.  All other systems reviewed and are negative.     Allergies  Review of patient's allergies indicates no known allergies.  Home Medications   Prior to Admission  medications   Not on File   BP 120/69  Temp(Src) 98.4 F (36.9 C) (Oral)  Resp 20  Wt 212 lb (96.163 kg)  SpO2 100%  LMP 06/14/2014  Breastfeeding? Yes Physical Exam  Nursing note and vitals reviewed. Constitutional: She is oriented to person, place, and time. She appears well-developed and well-nourished. No distress.  HENT:  Head: Normocephalic and atraumatic.  Right Ear: External ear normal.  Left Ear: External ear normal.  Nose: Nose normal.  Eyes: Conjunctivae are normal.  Neck: Normal range of motion. Neck supple.  Cardiovascular: Normal rate.   Pulmonary/Chest: Effort normal.  Abdominal: Soft.  Musculoskeletal: Normal range of motion.  Neurological: She is alert and oriented to person, place, and time.  Skin: Skin is warm and dry. She is not diaphoretic. No erythema.     Psychiatric: She has a normal mood and affect.    ED Course  Procedures (including critical care time) Medications  ibuprofen (ADVIL,MOTRIN) tablet 800 mg (800 mg Oral Given 06/20/14 1143)    Labs Review Labs Reviewed - No data to display  Imaging Review No results found.   EKG Interpretation None      INCISION AND DRAINAGE Performed by: Francee PiccoloPIEPENBRINK, Betzayda Braxton L Consent: Verbal consent obtained. Risks and benefits: risks, benefits and alternatives were discussed Type: abscess  Body area: left axilla  Anesthesia: local infiltration  Incision was made with a scalpel.  Local  anesthetic: lidocaine 2% w/ epinephrine  Anesthetic total: 4 ml  Complexity: complex Blunt dissection to break up loculations  Drainage: purulent and bloody  Drainage amount: scant  Packing material: none  Patient tolerance: Patient tolerated the procedure well with no immediate complications.     MDM   Final diagnoses:  Abscess of left axilla    Filed Vitals:   06/20/14 1129  BP: 120/69  Temp: 98.4 F (36.9 C)  Resp: 20   Afebrile, NAD, non-toxic appearing, AAOx4.  Patient with skin  abscess amenable to incision and drainage.  Abscess was not large enough to warrant packing or drain,  wound recheck in 2 days. Encouraged home warm soaks and flushing.  Mild signs of cellulitis is surrounding skin.  Will d/c to home.  No antibiotic therapy is indicated. Return precautions discussed. Patient is agreeable to plan. Patient is stable at time of discharge       Jeannetta EllisJennifer L Newman Waren, PA-C 06/20/14 1200

## 2014-09-12 ENCOUNTER — Encounter (HOSPITAL_COMMUNITY): Payer: Self-pay | Admitting: Emergency Medicine

## 2014-09-12 ENCOUNTER — Emergency Department (INDEPENDENT_AMBULATORY_CARE_PROVIDER_SITE_OTHER)
Admission: EM | Admit: 2014-09-12 | Discharge: 2014-09-12 | Disposition: A | Discharge status: Home / Self Care | Payer: 59 | Source: Home / Self Care | Attending: Family Medicine | Admitting: Family Medicine

## 2014-09-12 DIAGNOSIS — S40861A Insect bite (nonvenomous) of right upper arm, initial encounter: Secondary | ICD-10-CM

## 2014-09-12 DIAGNOSIS — T148 Other injury of unspecified body region: Secondary | ICD-10-CM

## 2014-09-12 DIAGNOSIS — W57XXXA Bitten or stung by nonvenomous insect and other nonvenomous arthropods, initial encounter: Principal | ICD-10-CM

## 2014-09-12 HISTORY — DX: Hidradenitis suppurativa: L73.2

## 2014-09-12 MED ORDER — FLUTICASONE PROPIONATE 0.05 % EX CREA: TOPICAL_CREAM | Freq: Two times a day (BID) | CUTANEOUS | Status: DC

## 2014-09-12 MED ORDER — MINOCYCLINE HCL 100 MG PO CAPS: 100.0000 mg | ORAL_CAPSULE | Freq: Two times a day (BID) | ORAL | Status: DC

## 2014-09-12 NOTE — ED Provider Notes (Signed)
CSN: [id number]     Arrival date & time 09/12/14  1225 History   First MD Initiated Contact with Patient 09/12/14 1235     Chief Complaint  Patient presents with  . Abscess  . Cellulitis   (Consider location/radiation/quality/duration/timing/severity/associated sxs/prior Treatment) Patient is a 23 y.o. female presenting with abscess. The history is provided by the patient.  Abscess Location:  Shoulder/arm Shoulder/arm abscess location:  R forearm Abscess quality: itching, painful, redness and warmth   Red streaking: no   Progression:  Unchanged Chronicity:  New Context: insect bite/sting   Associated symptoms: no fever   Risk factors comment:  H/o hydradenitis - bilat , mild , states sl flare.   Past Medical History  Diagnosis Date  . No pertinent past medical history   . Anemia   . Herpes     last outbreak two weeks ago ( mid march 2015)  . Hydradenitis    Past Surgical History  Procedure Laterality Date  . Nasal sugery     Family History  Problem Relation Age of Onset  . Gallstones Mother    History  Substance Use Topics  . Smoking status: Former Smoker    Quit date: 05/16/2011  . Smokeless tobacco: Never Used  . Alcohol Use: No   OB History   Grav Para Term Preterm Abortions TAB SAB Ect Mult Living   0 0 0 0 0 0 2     Review of Systems  Constitutional: Negative.  Negative for fever.  HENT: Negative.   Skin: Positive for rash. Negative for wound.    Allergies  Review of patient's allergies indicates no known allergies.  Home Medications   Prior to Admission medications   Medication Sig Start Date End Date Taking? Authorizing Provider  fluticasone (CUTIVATE) 0.05 % cream Apply topically 2 (two) times daily. 09/12/14   Linna Hoff, MD  minocycline (MINOCIN,DYNACIN) 100 MG capsule Take 1 capsule (100 mg total) by mouth 2 (two) times daily. 09/12/14   Linna Hoff, MD   BP 119/80  Pulse 85  Temp(Src) 98.3 F (36.8 C) (Oral)  Resp 16  SpO2  99%  LMP 09/12/2014 Physical Exam  Nursing note and vitals reviewed. Constitutional: She is oriented to person, place, and time. She appears well-developed and well-nourished.  Musculoskeletal: She exhibits tenderness.  Axillary adenitis.  Neurological: She is alert and oriented to person, place, and time.  Skin: Skin is warm and dry. Rash noted. There is erythema.  bilat axillary adenitis, local indurated lesion with central crust to right volar forearm approx 0.5cm.    ED Course  Procedures (including critical care time) Labs Review Labs Reviewed - No data to display  Imaging Review No results found.   MDM   1. Insect bite of right arm, initial encounter        Linna Hoff, MD 09/12/14 1256

## 2014-09-12 NOTE — Discharge Instructions (Signed)
Warm compress twice a day when you take the antibiotic, take all of medicine, return as needed. °

## 2014-09-12 NOTE — ED Notes (Signed)
C/O ongoing intermittent bilat axillary abscesses.  Also c/o "insect bite" to right forearm 2 days ago; noticed bump initially, which is increasing in size, erythematic, warm, painful.

## 2014-09-15 ENCOUNTER — Encounter (HOSPITAL_COMMUNITY): Payer: Self-pay | Admitting: Emergency Medicine

## 2014-09-15 ENCOUNTER — Emergency Department (HOSPITAL_COMMUNITY)
Admission: EM | Admit: 2014-09-15 | Discharge: 2014-09-15 | Disposition: A | Discharge status: Home / Self Care | Payer: 59 | Attending: Emergency Medicine | Admitting: Emergency Medicine

## 2014-09-15 DIAGNOSIS — IMO0002 Reserved for concepts with insufficient information to code with codable children: Secondary | ICD-10-CM | POA: Diagnosis not present

## 2014-09-15 DIAGNOSIS — IMO0001 Reserved for inherently not codable concepts without codable children: Secondary | ICD-10-CM | POA: Diagnosis present

## 2014-09-15 DIAGNOSIS — W57XXXA Bitten or stung by nonvenomous insect and other nonvenomous arthropods, initial encounter: Principal | ICD-10-CM

## 2014-09-15 DIAGNOSIS — Z862 Personal history of diseases of the blood and blood-forming organs and certain disorders involving the immune mechanism: Secondary | ICD-10-CM | POA: Diagnosis not present

## 2014-09-15 DIAGNOSIS — T148 Other injury of unspecified body region: Secondary | ICD-10-CM | POA: Diagnosis not present

## 2014-09-15 DIAGNOSIS — L03113 Cellulitis of right upper limb: Secondary | ICD-10-CM

## 2014-09-15 DIAGNOSIS — Y9289 Other specified places as the place of occurrence of the external cause: Secondary | ICD-10-CM | POA: Insufficient documentation

## 2014-09-15 DIAGNOSIS — Y9389 Activity, other specified: Secondary | ICD-10-CM | POA: Insufficient documentation

## 2014-09-15 DIAGNOSIS — Z8619 Personal history of other infectious and parasitic diseases: Secondary | ICD-10-CM | POA: Diagnosis not present

## 2014-09-15 DIAGNOSIS — Z87891 Personal history of nicotine dependence: Secondary | ICD-10-CM | POA: Diagnosis not present

## 2014-09-15 DIAGNOSIS — Z792 Long term (current) use of antibiotics: Secondary | ICD-10-CM | POA: Insufficient documentation

## 2014-09-15 DIAGNOSIS — L089 Local infection of the skin and subcutaneous tissue, unspecified: Secondary | ICD-10-CM | POA: Insufficient documentation

## 2014-09-15 MED ORDER — IBUPROFEN 800 MG PO TABS: 800.0000 mg | ORAL_TABLET | Freq: Three times a day (TID) | ORAL | Status: DC | PRN

## 2014-09-15 MED ORDER — MINOCYCLINE HCL 100 MG PO CAPS: 100.0000 mg | ORAL_CAPSULE | Freq: Two times a day (BID) | ORAL | Status: DC

## 2014-09-15 NOTE — Discharge Instructions (Signed)

## 2014-09-15 NOTE — ED Provider Notes (Signed)
CSN: [id number]     Arrival date & time 09/15/14  1122 History   First MD Initiated Contact with Patient 09/15/14 1127     No chief complaint on file.    (Consider location/radiation/quality/duration/timing/severity/associated sxs/prior Treatment) HPI  23 year old female with history of hidradenitis suppurativa and history of herpes presents for evaluation of spider bite. Patient was seen at urgent care 3 days ago for complaint of spider bite to her right forearm that is itching painful and red. Patient was given minocycline and fluticasone cream as treatment.  Patient states she went to the store to obtain the medication however she lost it and has not been taking any medication. She continues to endorse sharp pain to her forearm, she also able to express fluid and pus from the site.  She is here requesting additional treatment. Denies fever, numbness.  Does not recall seeing a spider.  No hx of DM    Past Medical History  Diagnosis Date  . No pertinent past medical history   . Anemia   . Herpes     last outbreak two weeks ago ( mid march 2015)  . Hydradenitis    Past Surgical History  Procedure Laterality Date  . Nasal sugery     Family History  Problem Relation Age of Onset  . Gallstones Mother    History  Substance Use Topics  . Smoking status: Former Smoker    Quit date: 05/16/2011  . Smokeless tobacco: Never Used  . Alcohol Use: No   OB History   Grav Para Term Preterm Abortions TAB SAB Ect Mult Living   2 2 2  0 0 0 0 0 0 2     Review of Systems  Constitutional: Negative for fever.  Skin: Positive for rash.  Neurological: Negative for numbness.      Allergies  Review of patient's allergies indicates no known allergies.  Home Medications   Prior to Admission medications   Medication Sig Start Date End Date Taking? Authorizing Provider  fluticasone (CUTIVATE) 0.05 % cream Apply topically 2 (two) times daily. 09/12/14   Linna HoffJames D Kindl, MD  minocycline  (MINOCIN,DYNACIN) 100 MG capsule Take 1 capsule (100 mg total) by mouth 2 (two) times daily. 09/12/14   Linna HoffJames D Kindl, MD   LMP 09/12/2014 Physical Exam  Nursing note and vitals reviewed. Constitutional: She appears well-developed and well-nourished. No distress.  HENT:  Head: Atraumatic.  Eyes: Conjunctivae are normal.  Neck: Neck supple.  Neurological: She is alert.  Skin: Rash (R forearm: a quartersize area of induration, central crusting which exudes serous fluid when manipulated, ttp, with surrounding erythema.) noted.  Psychiatric: She has a normal mood and affect.    ED Course  Procedures (including critical care time)  11:39 AM Pt with cellulitis with early forming abscess to R forearm, sensation intact, no necrotic tissue.  Wound is actively draining serous fluid.  Option of I&D vs. Taking abx and pain meds with use of warm compress.  Pt elected to continue warm compress.  Will filled abx.  Pt agrees to return if sxs worsen and may required I&D. Pt also report not being pregnant and not breast feeding at this time.   Labs Review Labs Reviewed - No data to display  Imaging Review No results found.   EKG Interpretation None      MDM   Final diagnoses:  Cellulitis of right forearm    BP 112/79  Pulse 76  Temp(Src) 98.6 F (37 C) (Oral)  Resp  17  SpO2 100%  LMP 09/12/2014     Fayrene Helper, PA-C 09/15/14 1143

## 2014-09-15 NOTE — ED Provider Notes (Signed)
Medical screening examination/treatment/procedure(s) were performed by non-physician practitioner and as supervising physician I was immediately available for consultation/collaboration.   EKG Interpretation None       Rolfe Hartsell, MD 09/15/14 1643 

## 2014-09-15 NOTE — ED Notes (Signed)
Pt c/o insect bite on right forearm for 5 days. Site is currently draining at this time.

## 2014-10-18 ENCOUNTER — Encounter (HOSPITAL_COMMUNITY): Payer: Self-pay | Admitting: Emergency Medicine

## 2014-12-13 ENCOUNTER — Encounter: Payer: Self-pay | Admitting: *Deleted

## 2014-12-14 ENCOUNTER — Encounter: Payer: Self-pay | Admitting: Obstetrics & Gynecology

## 2015-01-24 ENCOUNTER — Encounter (HOSPITAL_COMMUNITY): Payer: Self-pay

## 2015-01-24 ENCOUNTER — Emergency Department (HOSPITAL_COMMUNITY)
Admission: EM | Admit: 2015-01-24 | Discharge: 2015-01-24 | Disposition: A | Discharge status: Home / Self Care | Payer: 59 | Attending: Emergency Medicine | Admitting: Emergency Medicine

## 2015-01-24 DIAGNOSIS — Z3202 Encounter for pregnancy test, result negative: Secondary | ICD-10-CM | POA: Insufficient documentation

## 2015-01-24 DIAGNOSIS — B349 Viral infection, unspecified: Secondary | ICD-10-CM | POA: Insufficient documentation

## 2015-01-24 DIAGNOSIS — R51 Headache: Secondary | ICD-10-CM | POA: Diagnosis present

## 2015-01-24 DIAGNOSIS — Z862 Personal history of diseases of the blood and blood-forming organs and certain disorders involving the immune mechanism: Secondary | ICD-10-CM | POA: Insufficient documentation

## 2015-01-24 DIAGNOSIS — R519 Headache, unspecified: Secondary | ICD-10-CM

## 2015-01-24 DIAGNOSIS — Z872 Personal history of diseases of the skin and subcutaneous tissue: Secondary | ICD-10-CM | POA: Insufficient documentation

## 2015-01-24 DIAGNOSIS — Z87891 Personal history of nicotine dependence: Secondary | ICD-10-CM | POA: Diagnosis not present

## 2015-01-24 LAB — CBC WITH DIFFERENTIAL/PLATELET
BASOS PCT: 0 % (ref 0–1)
Basophils Absolute: 0 10*3/uL (ref 0.0–0.1)
EOS PCT: 0 % (ref 0–5)
Eosinophils Absolute: 0 10*3/uL (ref 0.0–0.7)
HCT: 36.8 % (ref 36.0–46.0)
Hemoglobin: 12 g/dL (ref 12.0–15.0)
LYMPHS ABS: 1.5 10*3/uL (ref 0.7–4.0)
LYMPHS PCT: 12 % (ref 12–46)
MCH: 28.4 pg (ref 26.0–34.0)
MCHC: 32.6 g/dL (ref 30.0–36.0)
MCV: 87 fL (ref 78.0–100.0)
MONO ABS: 1.1 10*3/uL — AB (ref 0.1–1.0)
MONOS PCT: 9 % (ref 3–12)
Neutro Abs: 10.1 10*3/uL — ABNORMAL HIGH (ref 1.7–7.7)
Neutrophils Relative %: 79 % — ABNORMAL HIGH (ref 43–77)
Platelets: 212 10*3/uL (ref 150–400)
RBC: 4.23 MIL/uL (ref 3.87–5.11)
RDW: 12.6 % (ref 11.5–15.5)
WBC: 12.7 10*3/uL — ABNORMAL HIGH (ref 4.0–10.5)

## 2015-01-24 LAB — URINALYSIS, ROUTINE W REFLEX MICROSCOPIC
Bilirubin Urine: NEGATIVE
GLUCOSE, UA: NEGATIVE mg/dL
Hgb urine dipstick: NEGATIVE
Ketones, ur: NEGATIVE mg/dL
Leukocytes, UA: NEGATIVE
Nitrite: NEGATIVE
PH: 7 (ref 5.0–8.0)
PROTEIN: NEGATIVE mg/dL
Specific Gravity, Urine: 1.012 (ref 1.005–1.030)
Urobilinogen, UA: 0.2 mg/dL (ref 0.0–1.0)

## 2015-01-24 LAB — COMPREHENSIVE METABOLIC PANEL
ALK PHOS: 62 U/L (ref 39–117)
ALT: 27 U/L (ref 0–35)
AST: 19 U/L (ref 0–37)
Albumin: 3.9 g/dL (ref 3.5–5.2)
Anion gap: 7 (ref 5–15)
BUN: 7 mg/dL (ref 6–23)
CO2: 23 mmol/L (ref 19–32)
Calcium: 8.7 mg/dL (ref 8.4–10.5)
Chloride: 104 mmol/L (ref 96–112)
Creatinine, Ser: 0.78 mg/dL (ref 0.50–1.10)
GFR calc non Af Amer: >90 mL/min (ref >90)
Glucose, Bld: 108 mg/dL — ABNORMAL HIGH (ref 70–99)
Potassium: 3.5 mmol/L (ref 3.5–5.1)
Sodium: 134 mmol/L — ABNORMAL LOW (ref 135–145)
Total Bilirubin: 0.4 mg/dL (ref 0.3–1.2)
Total Protein: 7.8 g/dL (ref 6.0–8.3)

## 2015-01-24 LAB — RAPID STREP SCREEN (MED CTR MEBANE ONLY): Streptococcus, Group A Screen (Direct): NEGATIVE

## 2015-01-24 LAB — PREGNANCY, URINE: Preg Test, Ur: NEGATIVE

## 2015-01-24 LAB — I-STAT CG4 LACTIC ACID, ED: LACTIC ACID, VENOUS: 1.39 mmol/L (ref 0.5–2.0)

## 2015-01-24 MED ORDER — METOCLOPRAMIDE HCL 5 MG/ML IJ SOLN
5.0000 mg | Freq: Once | INTRAMUSCULAR | Status: AC
  Administered 2015-01-24: 5 mg via INTRAVENOUS
  Filled 2015-01-24: qty 2

## 2015-01-24 MED ORDER — DIPHENHYDRAMINE HCL 50 MG/ML IJ SOLN
25.0000 mg | Freq: Once | INTRAMUSCULAR | Status: AC
  Administered 2015-01-24: 25 mg via INTRAVENOUS
  Filled 2015-01-24: qty 1

## 2015-01-24 MED ORDER — ONDANSETRON HCL 4 MG/2ML IJ SOLN
4.0000 mg | Freq: Once | INTRAMUSCULAR | Status: AC
  Administered 2015-01-24: 4 mg via INTRAVENOUS
  Filled 2015-01-24: qty 2

## 2015-01-24 MED ORDER — ACETAMINOPHEN 325 MG PO TABS
650.0000 mg | ORAL_TABLET | Freq: Four times a day (QID) | ORAL | Status: DC | PRN
  Administered 2015-01-24 (×2): 650 mg via ORAL
  Filled 2015-01-24 (×3): qty 2

## 2015-01-24 MED ORDER — SODIUM CHLORIDE 0.9 % IV BOLUS (SEPSIS)
1000.0000 mL | INTRAVENOUS | Status: AC
  Administered 2015-01-24: 1000 mL via INTRAVENOUS

## 2015-01-24 NOTE — ED Provider Notes (Signed)
CSN: [id number]     Arrival date & time 01/24/15  1602 History   First MD Initiated Contact with Patient 01/24/15 1834     Chief Complaint  Patient presents with  . Headache  . Nausea  . Fever     (Consider location/radiation/quality/duration/timing/severity/associated sxs/prior Treatment) Patient is a 24 y.o. female presenting with headaches and fever. The history is provided by the patient.  Headache Pain location:  R temporal and L temporal Quality:  Dull Radiates to:  Does not radiate Severity currently:  4/10 Severity at highest:  8/10 Onset quality: over minutes. Duration:  4 days Timing:  Constant Progression:  Waxing and waning Chronicity:  New Context comment:  At rest Relieved by:  NSAIDs Worsened by:  Activity Ineffective treatments:  None tried Associated symptoms: congestion, cough, fever, nausea and vomiting   Associated symptoms: no abdominal pain, no back pain, no diarrhea, no dizziness, no pain, no fatigue and no neck pain   Fever Associated symptoms: congestion, cough, headaches, nausea and vomiting   Associated symptoms: no chest pain, no diarrhea and no dysuria     Past Medical History  Diagnosis Date  . No pertinent past medical history   . Anemia   . Herpes     last outbreak two weeks ago ( mid march 2015)  . Hydradenitis    Past Surgical History  Procedure Laterality Date  . Nasal sugery     Family History  Problem Relation Age of Onset  . Gallstones Mother    History  Substance Use Topics  . Smoking status: Former Smoker    Quit date: 05/16/2011  . Smokeless tobacco: Never Used  . Alcohol Use: No   OB History    Gravida Para Term Preterm AB TAB SAB Ectopic Multiple Living   2 2 2  0 0 0 0 0 0 2     Review of Systems  Constitutional: Positive for fever. Negative for fatigue.  HENT: Positive for congestion. Negative for drooling.   Eyes: Negative for pain.  Respiratory: Positive for cough. Negative for shortness of breath.    Cardiovascular: Negative for chest pain.  Gastrointestinal: Positive for nausea and vomiting. Negative for abdominal pain and diarrhea.  Genitourinary: Negative for dysuria and hematuria.  Musculoskeletal: Negative for back pain, gait problem and neck pain.  Skin: Negative for color change.  Neurological: Positive for headaches. Negative for dizziness.  Hematological: Negative for adenopathy.  Psychiatric/Behavioral: Negative for behavioral problems.  All other systems reviewed and are negative.     Allergies  Review of patient's allergies indicates no known allergies.  Home Medications   Prior to Admission medications   Medication Sig Start Date End Date Taking? Authorizing Provider  ibuprofen (ADVIL,MOTRIN) 800 MG tablet Take 1 tablet (800 mg total) by mouth every 8 (eight) hours as needed. 09/15/14  Yes Fayrene HelperBowie Tran, PA-C  fluticasone (CUTIVATE) 0.05 % cream Apply topically 2 (two) times daily. Patient not taking: Reported on 01/24/2015 09/12/14   Linna HoffJames D Kindl, MD  minocycline (MINOCIN,DYNACIN) 100 MG capsule Take 1 capsule (100 mg total) by mouth 2 (two) times daily. Patient not taking: Reported on 01/24/2015 09/15/14   Fayrene HelperBowie Tran, PA-C   BP 107/55 mmHg  Pulse 99  Temp(Src) 98 F (36.7 C) (Oral)  Resp 17  SpO2 99%  LMP 01/24/2015 Physical Exam  Constitutional: She is oriented to person, place, and time. She appears well-developed and well-nourished.  HENT:  Head: Normocephalic.  Mouth/Throat: Oropharynx is clear and moist. No oropharyngeal exudate.  Eyes: Conjunctivae and EOM are normal. Pupils are equal, round, and reactive to light.  Neck: Normal range of motion. Neck supple.  Cardiovascular: Normal rate, regular rhythm, normal heart sounds and intact distal pulses.  Exam reveals no gallop and no friction rub.   No murmur heard. Pulmonary/Chest: Effort normal and breath sounds normal. No respiratory distress. She has no wheezes.  Abdominal: Soft. Bowel sounds are normal.  There is no tenderness. There is no rebound and no guarding.  Musculoskeletal: Normal range of motion. She exhibits no edema or tenderness.  Neurological: She is alert and oriented to person, place, and time.  alert, oriented x3 speech: normal in context and clarity memory: intact grossly cranial nerves II-XII: intact motor strength: full proximally and distally no involuntary movements or tremors sensation: intact to light touch diffusely  cerebellar: finger-to-nose intact gait: normal   Skin: Skin is warm and dry.  Psychiatric: She has a normal mood and affect. Her behavior is normal.  Nursing note and vitals reviewed.   ED Course  Procedures (including critical care time) Labs Review Labs Reviewed  CBC WITH DIFFERENTIAL/PLATELET - Abnormal; Notable for the following:    WBC 12.7 (*)    Neutrophils Relative % 79 (*)    Neutro Abs 10.1 (*)    Monocytes Absolute 1.1 (*)    All other components within normal limits  COMPREHENSIVE METABOLIC PANEL - Abnormal; Notable for the following:    Sodium 134 (*)    Glucose, Bld 108 (*)    All other components within normal limits  RAPID STREP SCREEN  CULTURE, BLOOD (ROUTINE X 2)  CULTURE, BLOOD (ROUTINE X 2)  URINE CULTURE  CULTURE, GROUP A STREP  URINALYSIS, ROUTINE W REFLEX MICROSCOPIC  PREGNANCY, URINE  I-STAT CG4 LACTIC ACID, ED    Imaging Review No results found.   EKG Interpretation None      MDM   Final diagnoses:  Viral syndrome  Headache, unspecified headache type    7:48 PM 24 y.o. female who presents with headache which began 4 days ago while getting ready for bed. She states that she is also had congestion, sore throat, fever, chills. She also developed a mild cough today. She was initially febrile and tachycardic here. Vital signs otherwise unremarkable. Headache has decreased to a 4 with Tylenol. She has normal range of motion of her neck and appears well on exam. Likely viral syndrome. At very low suspicion  for meningitis given well appearance. Screening lab work unremarkable. Will give a migraine cocktail and swab for strep.  9:55 PM: I interpreted/reviewed the labs and/or imaging which were non-contributory.  Pt feeling much better. I have discussed the diagnosis/risks/treatment options with the patient and family and believe the pt to be eligible for discharge home to follow-up with her pcp as needed. We also discussed returning to the ED immediately if new or worsening sx occur. We discussed the sx which are most concerning (e.g., neck pain, worsening HA, intractable fever, inc vomiting) that necessitate immediate return. Medications administered to the patient during their visit and any new prescriptions provided to the patient are listed below.  Medications given during this visit Medications  acetaminophen (TYLENOL) tablet 650 mg (650 mg Oral Given 01/24/15 1637)  sodium chloride 0.9 % bolus 1,000 mL (1,000 mLs Intravenous New Bag/Given 01/24/15 1932)  metoCLOPramide (REGLAN) injection 5 mg (5 mg Intravenous Given 01/24/15 1932)  diphenhydrAMINE (BENADRYL) injection 25 mg (25 mg Intravenous Given 01/24/15 1933)    New Prescriptions  No medications on file     Purvis Sheffield, MD 01/24/15 2156

## 2015-01-24 NOTE — ED Notes (Signed)
Pt presents with c/o headache, nausea, and decreased appetite since last Thursday. Pt reports some vomiting, no diarrhea.

## 2015-01-24 NOTE — ED Notes (Signed)
Unable to repeat temp or collect rapid strep swab as pt is currently eating and drinking - pt advised to cease eating and drinking at this time. Pt verbalized understanding.

## 2015-01-25 LAB — URINE CULTURE
Colony Count: NO GROWTH
Culture: NO GROWTH

## 2015-01-26 LAB — CULTURE, GROUP A STREP

## 2015-01-30 LAB — CULTURE, BLOOD (ROUTINE X 2)
CULTURE: NO GROWTH
Culture: NO GROWTH

## 2015-03-15 ENCOUNTER — Emergency Department (HOSPITAL_COMMUNITY)
Admission: EM | Admit: 2015-03-15 | Discharge: 2015-03-16 | Disposition: A | Discharge status: Home / Self Care | Payer: 59 | Attending: Emergency Medicine | Admitting: Emergency Medicine

## 2015-03-15 ENCOUNTER — Encounter (HOSPITAL_COMMUNITY): Payer: Self-pay | Admitting: Emergency Medicine

## 2015-03-15 ENCOUNTER — Emergency Department (HOSPITAL_COMMUNITY): Payer: 59

## 2015-03-15 DIAGNOSIS — Z3202 Encounter for pregnancy test, result negative: Secondary | ICD-10-CM | POA: Diagnosis not present

## 2015-03-15 DIAGNOSIS — Z862 Personal history of diseases of the blood and blood-forming organs and certain disorders involving the immune mechanism: Secondary | ICD-10-CM | POA: Insufficient documentation

## 2015-03-15 DIAGNOSIS — Z7952 Long term (current) use of systemic steroids: Secondary | ICD-10-CM | POA: Diagnosis not present

## 2015-03-15 DIAGNOSIS — Z792 Long term (current) use of antibiotics: Secondary | ICD-10-CM | POA: Insufficient documentation

## 2015-03-15 DIAGNOSIS — R52 Pain, unspecified: Secondary | ICD-10-CM | POA: Diagnosis present

## 2015-03-15 DIAGNOSIS — R112 Nausea with vomiting, unspecified: Secondary | ICD-10-CM | POA: Insufficient documentation

## 2015-03-15 DIAGNOSIS — N390 Urinary tract infection, site not specified: Secondary | ICD-10-CM | POA: Insufficient documentation

## 2015-03-15 DIAGNOSIS — R000 Tachycardia, unspecified: Secondary | ICD-10-CM | POA: Insufficient documentation

## 2015-03-15 DIAGNOSIS — Z872 Personal history of diseases of the skin and subcutaneous tissue: Secondary | ICD-10-CM | POA: Insufficient documentation

## 2015-03-15 DIAGNOSIS — Z8619 Personal history of other infectious and parasitic diseases: Secondary | ICD-10-CM | POA: Insufficient documentation

## 2015-03-15 LAB — COMPREHENSIVE METABOLIC PANEL
ALT: 43 U/L — AB (ref 0–35)
AST: 34 U/L (ref 0–37)
Albumin: 3.5 g/dL (ref 3.5–5.2)
Alkaline Phosphatase: 52 U/L (ref 39–117)
Anion gap: 11 (ref 5–15)
BUN: 5 mg/dL — ABNORMAL LOW (ref 6–23)
CO2: 21 mmol/L (ref 19–32)
Calcium: 8.8 mg/dL (ref 8.4–10.5)
Chloride: 104 mmol/L (ref 96–112)
Creatinine, Ser: 0.79 mg/dL (ref 0.50–1.10)
GFR calc Af Amer: >90 mL/min (ref >90)
GFR calc non Af Amer: >90 mL/min (ref >90)
Glucose, Bld: 88 mg/dL (ref 70–99)
Potassium: 3.6 mmol/L (ref 3.5–5.1)
Sodium: 136 mmol/L (ref 135–145)
Total Bilirubin: 0.2 mg/dL — ABNORMAL LOW (ref 0.3–1.2)
Total Protein: 6.3 g/dL (ref 6.0–8.3)

## 2015-03-15 LAB — POC URINE PREG, ED: Preg Test, Ur: NEGATIVE

## 2015-03-15 LAB — CBC WITH DIFFERENTIAL/PLATELET
Basophils Absolute: 0 10*3/uL (ref 0.0–0.1)
Basophils Relative: 1 % (ref 0–1)
Eosinophils Absolute: 0.2 10*3/uL (ref 0.0–0.7)
Eosinophils Relative: 2 % (ref 0–5)
HEMATOCRIT: 34.7 % — AB (ref 36.0–46.0)
Hemoglobin: 11.7 g/dL — ABNORMAL LOW (ref 12.0–15.0)
LYMPHS PCT: 33 % (ref 12–46)
Lymphs Abs: 2.2 10*3/uL (ref 0.7–4.0)
MCH: 29 pg (ref 26.0–34.0)
MCHC: 33.7 g/dL (ref 30.0–36.0)
MCV: 86.1 fL (ref 78.0–100.0)
Monocytes Absolute: 1.1 10*3/uL — ABNORMAL HIGH (ref 0.1–1.0)
Monocytes Relative: 17 % — ABNORMAL HIGH (ref 3–12)
NEUTROS PCT: 47 % (ref 43–77)
Neutro Abs: 3.1 10*3/uL (ref 1.7–7.7)
PLATELETS: 174 10*3/uL (ref 150–400)
RBC: 4.03 MIL/uL (ref 3.87–5.11)
RDW: 13.4 % (ref 11.5–15.5)
WBC: 6.6 10*3/uL (ref 4.0–10.5)

## 2015-03-15 MED ORDER — SODIUM CHLORIDE 0.9 % IV SOLN: Freq: Once | INTRAVENOUS | Status: DC

## 2015-03-15 MED ORDER — ONDANSETRON HCL 4 MG/2ML IJ SOLN
4.0000 mg | Freq: Once | INTRAMUSCULAR | Status: DC
  Filled 2015-03-15: qty 2

## 2015-03-15 MED ORDER — ONDANSETRON 4 MG PO TBDP
4.0000 mg | ORAL_TABLET | Freq: Once | ORAL | Status: AC
  Administered 2015-03-15: 4 mg via ORAL
  Filled 2015-03-15: qty 1

## 2015-03-15 NOTE — ED Notes (Signed)
Pt adamantly refusing IV access, EDP notified. See new orders.

## 2015-03-15 NOTE — ED Notes (Signed)
Pt presents with generalized body aches, cough, fever, nausea, and vomiting since yesterday.

## 2015-03-15 NOTE — ED Notes (Signed)
Unable to locate in lobby. 

## 2015-03-15 NOTE — ED Provider Notes (Signed)
CSN: [id number]     Arrival date & time 03/15/15  2038 History   First MD Initiated Contact with Patient 03/15/15 2216     Chief Complaint  Patient presents with  . Generalized Body Aches  . Nausea     (Consider location/radiation/quality/duration/timing/severity/associated sxs/prior Treatment) HPI Comments: Patient states that she has had diffuse abdominal pain, nausea, vomiting, cough, body aches, low-grade temperature since yesterday.  She also has a history of an impacted gallstone that she had never followed up with she states last night she had 3 episodes of sharp abdominal pain radiating up under her shoulder consistent with her gallbladder issues  The history is provided by the patient.    Past Medical History  Diagnosis Date  . No pertinent past medical history   . Anemia   . Herpes     last outbreak two weeks ago ( mid march 2015)  . Hydradenitis    Past Surgical History  Procedure Laterality Date  . Nasal sugery     Family History  Problem Relation Age of Onset  . Gallstones Mother    History  Substance Use Topics  . Smoking status: Former Smoker    Quit date: 05/16/2011  . Smokeless tobacco: Never Used  . Alcohol Use: No   OB History    Gravida Para Term Preterm AB TAB SAB Ectopic Multiple Living   0 0 0 0 0 0 2     Review of Systems  Constitutional: Positive for fever.  Respiratory: Positive for cough. Negative for shortness of breath.   Gastrointestinal: Positive for nausea, vomiting and abdominal pain.  Genitourinary: Negative for dysuria, vaginal bleeding and vaginal discharge.  Musculoskeletal: Positive for arthralgias.  Skin: Negative for rash and wound.  All other systems reviewed and are negative.     Allergies  Review of patient's allergies indicates no known allergies.  Home Medications   Prior to Admission medications   Medication Sig Start Date End Date Taking? Authorizing Provider  ibuprofen (ADVIL,MOTRIN) 800 MG tablet  Take 1 tablet (800 mg total) by mouth every 8 (eight) hours as needed. 09/15/14  Yes Fayrene Helper, PA-C  fluticasone (CUTIVATE) 0.05 % cream Apply topically 2 (two) times daily. Patient not taking: Reported on 01/24/2015 09/12/14   Linna Hoff, MD  minocycline (MINOCIN,DYNACIN) 100 MG capsule Take 1 capsule (100 mg total) by mouth 2 (two) times daily. Patient not taking: Reported on 01/24/2015 09/15/14   Fayrene Helper, PA-C  ondansetron (ZOFRAN-ODT) 4 MG disintegrating tablet Take 1 tablet (4 mg total) by mouth every 8 (eight) hours as needed for nausea or vomiting. 03/16/15   Earley Favor, NP  sulfamethoxazole-trimethoprim (BACTRIM DS,SEPTRA DS) 800-160 MG per tablet Take 1 tablet by mouth 2 (two) times daily. 03/16/15   Earley Favor, NP   BP 128/63 mmHg  Pulse 83  Temp(Src) 99.1 F (37.3 C) (Oral)  Resp 18  SpO2 100%  LMP 02/27/2015 Physical Exam  Constitutional: She is oriented to person, place, and time. She appears well-developed and well-nourished.  HENT:  Head: Normocephalic.  Right Ear: External ear normal.  Mouth/Throat: Oropharynx is clear and moist.  Eyes: Pupils are equal, round, and reactive to light.  Neck: Normal range of motion.  Cardiovascular: Regular rhythm.  Tachycardia present.   Pulmonary/Chest: Effort normal and breath sounds normal.  Abdominal: Bowel sounds are normal. She exhibits no distension. There is no tenderness.  Musculoskeletal: Normal range of motion.  Neurological: She is alert and oriented to person,  place, and time.  Skin: Skin is warm and dry.  Nursing note and vitals reviewed.   ED Course  Procedures (including critical care time) Labs Review Labs Reviewed  URINALYSIS, ROUTINE W REFLEX MICROSCOPIC - Abnormal; Notable for the following:    APPearance CLOUDY (*)    Hgb urine dipstick SMALL (*)    Leukocytes, UA LARGE (*)    All other components within normal limits  CBC WITH DIFFERENTIAL/PLATELET - Abnormal; Notable for the following:    Hemoglobin  11.7 (*)    HCT 34.7 (*)    Monocytes Relative 17 (*)    Monocytes Absolute 1.1 (*)    All other components within normal limits  COMPREHENSIVE METABOLIC PANEL - Abnormal; Notable for the following:    BUN 5 (*)    ALT 43 (*)    Total Bilirubin 0.2 (*)    All other components within normal limits  URINE MICROSCOPIC-ADD ON - Abnormal; Notable for the following:    Squamous Epithelial / LPF MANY (*)    Bacteria, UA FEW (*)    All other components within normal limits  POC URINE PREG, ED    Imaging Review Dg Chest 2 View  03/15/2015   CLINICAL DATA:  Shortness of breast, cough and fever for 3 days  EXAM: CHEST  2 VIEW  COMPARISON:  None.  FINDINGS: The heart size and mediastinal contours are within normal limits. Both lungs are clear. The visualized skeletal structures are unremarkable.  IMPRESSION: No active cardiopulmonary disease.   Electronically Signed   By: Natasha MeadLiviu  Pop M.D.   On: 03/15/2015 21:48     EKG Interpretation None      MDM   Final diagnoses:  UTI (lower urinary tract infection)         Earley FavorGail Kree Rafter, NP 03/16/15 [id number]  Linwood DibblesJon Knapp, MD 03/16/15 [id number]

## 2015-03-16 DIAGNOSIS — N390 Urinary tract infection, site not specified: Secondary | ICD-10-CM | POA: Diagnosis not present

## 2015-03-16 LAB — URINALYSIS, ROUTINE W REFLEX MICROSCOPIC
BILIRUBIN URINE: NEGATIVE
Glucose, UA: NEGATIVE mg/dL
Ketones, ur: NEGATIVE mg/dL
NITRITE: NEGATIVE
PROTEIN: NEGATIVE mg/dL
Specific Gravity, Urine: 1.02 (ref 1.005–1.030)
Urobilinogen, UA: 0.2 mg/dL (ref 0.0–1.0)
pH: 6 (ref 5.0–8.0)

## 2015-03-16 LAB — URINE MICROSCOPIC-ADD ON

## 2015-03-16 MED ORDER — SULFAMETHOXAZOLE-TRIMETHOPRIM 800-160 MG PO TABS: 1.0000 | ORAL_TABLET | Freq: Two times a day (BID) | ORAL | Status: DC

## 2015-03-16 MED ORDER — ONDANSETRON 4 MG PO TBDP: 4.0000 mg | ORAL_TABLET | Freq: Three times a day (TID) | ORAL | Status: DC | PRN

## 2015-03-16 MED ORDER — SULFAMETHOXAZOLE-TRIMETHOPRIM 800-160 MG PO TABS
1.0000 | ORAL_TABLET | Freq: Once | ORAL | Status: AC
  Administered 2015-03-16: 1 via ORAL
  Filled 2015-03-16: qty 1

## 2015-03-16 NOTE — ED Notes (Signed)
Pt. Refused wheelchair and left with all belongings 

## 2015-03-16 NOTE — Discharge Instructions (Signed)
Your urine shows that you have a urinary tract infection.  Please take the antibiotic as directed until all tablets have been completed.  You also have been given a prescription for Zofran that she can use for nausea.  Please follow-up with your primary care physician as needed

## 2015-03-20 ENCOUNTER — Emergency Department (HOSPITAL_COMMUNITY)
Admission: EM | Admit: 2015-03-20 | Discharge: 2015-03-21 | Discharge status: Against Medical Advice | Payer: 59 | Attending: Emergency Medicine | Admitting: Emergency Medicine

## 2015-03-20 DIAGNOSIS — R1013 Epigastric pain: Secondary | ICD-10-CM | POA: Insufficient documentation

## 2015-03-20 LAB — CBC WITH DIFFERENTIAL/PLATELET
Basophils Absolute: 0.1 10*3/uL (ref 0.0–0.1)
Basophils Relative: 1 % (ref 0–1)
EOS PCT: 4 % (ref 0–5)
Eosinophils Absolute: 0.2 10*3/uL (ref 0.0–0.7)
HCT: 35.5 % — ABNORMAL LOW (ref 36.0–46.0)
Hemoglobin: 12 g/dL (ref 12.0–15.0)
LYMPHS PCT: 60 % — AB (ref 12–46)
Lymphs Abs: 2.7 10*3/uL (ref 0.7–4.0)
MCH: 29 pg (ref 26.0–34.0)
MCHC: 33.8 g/dL (ref 30.0–36.0)
MCV: 85.7 fL (ref 78.0–100.0)
MONOS PCT: 7 % (ref 3–12)
Monocytes Absolute: 0.3 10*3/uL (ref 0.1–1.0)
NEUTROS PCT: 28 % — AB (ref 43–77)
Neutro Abs: 1.3 10*3/uL — ABNORMAL LOW (ref 1.7–7.7)
Platelets: 207 10*3/uL (ref 150–400)
RBC: 4.14 MIL/uL (ref 3.87–5.11)
RDW: 12.9 % (ref 11.5–15.5)
WBC: 4.5 10*3/uL (ref 4.0–10.5)

## 2015-03-20 LAB — COMPREHENSIVE METABOLIC PANEL
ALT: 174 U/L — AB (ref 0–35)
AST: 137 U/L — ABNORMAL HIGH (ref 0–37)
Albumin: 3.7 g/dL (ref 3.5–5.2)
Alkaline Phosphatase: 68 U/L (ref 39–117)
Anion gap: 9 (ref 5–15)
BUN: 11 mg/dL (ref 6–23)
CO2: 22 mmol/L (ref 19–32)
Calcium: 8.3 mg/dL — ABNORMAL LOW (ref 8.4–10.5)
Chloride: 107 mmol/L (ref 96–112)
Creatinine, Ser: 0.89 mg/dL (ref 0.50–1.10)
GFR calc Af Amer: >90 mL/min (ref >90)
GLUCOSE: 99 mg/dL (ref 70–99)
Potassium: 4.1 mmol/L (ref 3.5–5.1)
SODIUM: 138 mmol/L (ref 135–145)
TOTAL PROTEIN: 7 g/dL (ref 6.0–8.3)
Total Bilirubin: 0.3 mg/dL (ref 0.3–1.2)

## 2015-03-20 LAB — LIPASE, BLOOD: Lipase: 33 U/L (ref 11–59)

## 2015-03-20 NOTE — ED Notes (Signed)
Pt seen last week for epigastric abd pain, nausea.  Dx: UTI, pt still taking antibiotics.  Pain is constant.  Pt has h/o gallstones.

## 2015-03-20 NOTE — ED Notes (Signed)
Patient left ED at this time. 

## 2015-05-10 ENCOUNTER — Emergency Department (HOSPITAL_COMMUNITY)
Admission: EM | Admit: 2015-05-10 | Discharge: 2015-05-10 | Disposition: A | Discharge status: Home / Self Care | Payer: 59 | Attending: Emergency Medicine | Admitting: Emergency Medicine

## 2015-05-10 ENCOUNTER — Encounter (HOSPITAL_COMMUNITY): Payer: Self-pay | Admitting: Cardiology

## 2015-05-10 DIAGNOSIS — Z862 Personal history of diseases of the blood and blood-forming organs and certain disorders involving the immune mechanism: Secondary | ICD-10-CM | POA: Insufficient documentation

## 2015-05-10 DIAGNOSIS — Z87891 Personal history of nicotine dependence: Secondary | ICD-10-CM | POA: Insufficient documentation

## 2015-05-10 DIAGNOSIS — Z7951 Long term (current) use of inhaled steroids: Secondary | ICD-10-CM | POA: Insufficient documentation

## 2015-05-10 DIAGNOSIS — Z792 Long term (current) use of antibiotics: Secondary | ICD-10-CM | POA: Diagnosis not present

## 2015-05-10 DIAGNOSIS — L02415 Cutaneous abscess of right lower limb: Secondary | ICD-10-CM | POA: Diagnosis not present

## 2015-05-10 DIAGNOSIS — Z8619 Personal history of other infectious and parasitic diseases: Secondary | ICD-10-CM | POA: Insufficient documentation

## 2015-05-10 DIAGNOSIS — L0291 Cutaneous abscess, unspecified: Secondary | ICD-10-CM

## 2015-05-10 DIAGNOSIS — Z791 Long term (current) use of non-steroidal anti-inflammatories (NSAID): Secondary | ICD-10-CM | POA: Diagnosis not present

## 2015-05-10 MED ORDER — SULFAMETHOXAZOLE-TRIMETHOPRIM 800-160 MG PO TABS
1.0000 | ORAL_TABLET | Freq: Two times a day (BID) | ORAL | Status: AC
Start: 2015-05-10 — End: 2015-05-17

## 2015-05-10 MED ORDER — LIDOCAINE HCL 2 % IJ SOLN: 15.0000 mL | Freq: Once | INTRAMUSCULAR | Status: DC

## 2015-05-10 MED ORDER — TRAMADOL HCL 50 MG PO TABS
50.0000 mg | ORAL_TABLET | Freq: Four times a day (QID) | ORAL | Status: DC | PRN
Start: 2015-05-10 — End: 2015-10-14

## 2015-05-10 NOTE — Discharge Instructions (Signed)

## 2015-05-10 NOTE — ED Notes (Signed)
Pt reports she noticed a boil to her right thigh about 4 days ago. No drainage at this time. No fever at home.

## 2015-05-10 NOTE — ED Provider Notes (Signed)
CSN: [id number]     Arrival date & time 05/10/15  1704 History  This chart was scribed for Jessica Peliffany Izaac Reisig, PA, working with Mancel BaleElliott Wentz, MD by Lyndel SafeKaitlyn Shelton, ED Scribe. This paitent was seen in room TR05C/TR05C and the patient's care was started at 5:58 PM.   Chief Complaint  Patient presents with  . Abscess   Patient is a 24 y.o. female presenting with abscess. The history is provided by the patient. No language interpreter was used.  Abscess   HPI Comments: Jessica Vasquez is a 24 y.o. female, with a PMhx of herpes, who presents to the Emergency Department complaining of a gradually worsening abscess on her inner, right thigh onset 4 days ago. Pt reports associated swelling and mild pain. She has not tried any treatments pta. Pt denies drainage and fever.   Past Medical History  Diagnosis Date  . No pertinent past medical history   . Anemia   . Herpes     last outbreak two weeks ago ( mid march 2015)  . Hydradenitis    Past Surgical History  Procedure Laterality Date  . Nasal sugery     Family History  Problem Relation Age of Onset  . Gallstones Mother    History  Substance Use Topics  . Smoking status: Former Smoker    Quit date: 05/16/2011  . Smokeless tobacco: Never Used  . Alcohol Use: No   OB History    Gravida Para Term Preterm AB TAB SAB Ectopic Multiple Living   2 2 2  0 0 0 0 0 0 2     Review of Systems  Skin: Positive for wound.  All other systems reviewed and are negative.  Allergies  Review of patient's allergies indicates no known allergies.  Home Medications   Prior to Admission medications   Medication Sig Start Date End Date Taking? Authorizing Provider  fluticasone (CUTIVATE) 0.05 % cream Apply topically 2 (two) times daily. Patient not taking: Reported on 01/24/2015 09/12/14   Linna HoffJames D Kindl, MD  ibuprofen (ADVIL,MOTRIN) 800 MG tablet Take 1 tablet (800 mg total) by mouth every 8 (eight) hours as needed. 09/15/14   Fayrene HelperBowie Tran, PA-C  minocycline  (MINOCIN,DYNACIN) 100 MG capsule Take 1 capsule (100 mg total) by mouth 2 (two) times daily. Patient not taking: Reported on 01/24/2015 09/15/14   Fayrene HelperBowie Tran, PA-C  ondansetron (ZOFRAN-ODT) 4 MG disintegrating tablet Take 1 tablet (4 mg total) by mouth every 8 (eight) hours as needed for nausea or vomiting. 03/16/15   Earley FavorGail Schulz, NP  sulfamethoxazole-trimethoprim (BACTRIM DS,SEPTRA DS) 800-160 MG per tablet Take 1 tablet by mouth 2 (two) times daily. 03/16/15   Earley FavorGail Schulz, NP  sulfamethoxazole-trimethoprim (BACTRIM DS,SEPTRA DS) 800-160 MG per tablet Take 1 tablet by mouth 2 (two) times daily. 05/10/15 05/17/15  Jessica Peliffany Gerard Bonus, PA-C  traMADol (ULTRAM) 50 MG tablet Take 1 tablet (50 mg total) by mouth every 6 (six) hours as needed. 05/10/15   Monta Police Neva SeatGreene, PA-C   BP 104/63 mmHg  Pulse 87  Temp(Src) 98.4 F (36.9 C) (Oral)  Resp 16  Ht 5\' 9"  (1.753 m)  Wt 208 lb (94.348 kg)  BMI 30.70 kg/m2  SpO2 97%  LMP 05/10/2015  Physical Exam  Constitutional: She appears well-developed and well-nourished.  HENT:  Head: Normocephalic and atraumatic.  Eyes: Conjunctivae are normal. Right eye exhibits no discharge. Left eye exhibits no discharge.  Pulmonary/Chest: Effort normal. No respiratory distress.  Genitourinary:     Neurological: She is alert. Coordination normal.  Skin: Skin is warm and dry. No rash noted. She is not diaphoretic. No erythema.  Psychiatric: She has a normal mood and affect.  Nursing note and vitals reviewed.   ED Course  Procedures  DIAGNOSTIC STUDIES: Oxygen Saturation is 97% on RA, normal by my interpretation.    COORDINATION OF CARE: 6:02 PM Discussed treatment plan with pt at bedside and pt agreed to plan. INCISION AND DRAINAGE PROCEDURE NOTE: Patient identification was confirmed and verbal consent was obtained. This procedure was performed by Jessica Pel, PA at 6:02 PM. Site: right, inner thigh  Sterile procedures observed Needle size: 25 gauge Anesthetic  used (type and amt): lidocaine 2% w/o epinephrine  Blade size: 11  Drainage: moderate Complexity: Complex Packing used: none  Site anesthetized, incision made over site, wound drained and explored loculations, rinsed with copious amounts of normal saline, wound packed with sterile gauze, covered with dry, sterile dressing.  Pt tolerated procedure well without complications.  Instructions for care discussed verbally and pt provided with additional written instructions for homecare and f/u.   Labs Review Labs Reviewed - No data to display  Imaging Review No results found.   EKG Interpretation None      MDM   Final diagnoses:  Abscess    Wound explored and no puss remaining. Rx : abx and pain medications.  24 y.o.Jessica Vasquez's evaluation in the Emergency Department is complete. It has been determined that no acute conditions requiring further emergency intervention are present at this time. The patient/guardian have been advised of the diagnosis and plan. We have discussed signs and symptoms that warrant return to the ED, such as changes or worsening in symptoms.  Vital signs are stable at discharge. Filed Vitals:   05/10/15 1728  BP: 104/63  Pulse: 87  Temp: 98.4 F (36.9 C)  Resp: 16    Patient/guardian has voiced understanding and agreed to follow-up with the PCP or specialist.  I personally performed the services described in this documentation, which was scribed in my presence. The recorded information has been reviewed and is accurate.   Jessica Pel, PA-C 05/10/15 1825  Mancel Bale, MD 05/11/15 [phone]

## 2015-10-14 ENCOUNTER — Emergency Department (HOSPITAL_COMMUNITY): Payer: 59

## 2015-10-14 ENCOUNTER — Inpatient Hospital Stay (HOSPITAL_COMMUNITY)
Admission: EM | Admit: 2015-10-14 | Discharge: 2015-10-17 | DRG: 419 | Disposition: A | Discharge status: Home / Self Care | Payer: 59 | Attending: Surgery | Admitting: Surgery

## 2015-10-14 ENCOUNTER — Encounter (HOSPITAL_COMMUNITY): Payer: Self-pay | Admitting: Emergency Medicine

## 2015-10-14 DIAGNOSIS — R112 Nausea with vomiting, unspecified: Secondary | ICD-10-CM

## 2015-10-14 DIAGNOSIS — R1011 Right upper quadrant pain: Secondary | ICD-10-CM | POA: Diagnosis present

## 2015-10-14 DIAGNOSIS — K8065 Calculus of gallbladder and bile duct with chronic cholecystitis with obstruction: Secondary | ICD-10-CM | POA: Diagnosis present

## 2015-10-14 DIAGNOSIS — Z87891 Personal history of nicotine dependence: Secondary | ICD-10-CM | POA: Diagnosis not present

## 2015-10-14 DIAGNOSIS — Z419 Encounter for procedure for purposes other than remedying health state, unspecified: Secondary | ICD-10-CM

## 2015-10-14 DIAGNOSIS — K8020 Calculus of gallbladder without cholecystitis without obstruction: Secondary | ICD-10-CM | POA: Diagnosis present

## 2015-10-14 DIAGNOSIS — K8050 Calculus of bile duct without cholangitis or cholecystitis without obstruction: Secondary | ICD-10-CM | POA: Diagnosis not present

## 2015-10-14 DIAGNOSIS — K819 Cholecystitis, unspecified: Secondary | ICD-10-CM

## 2015-10-14 HISTORY — DX: Calculus of gallbladder without cholecystitis without obstruction: K80.20

## 2015-10-14 LAB — URINE MICROSCOPIC-ADD ON

## 2015-10-14 LAB — CBC WITH DIFFERENTIAL/PLATELET
Basophils Absolute: 0 10*3/uL (ref 0.0–0.1)
Basophils Relative: 0 %
EOS ABS: 0.1 10*3/uL (ref 0.0–0.7)
Eosinophils Relative: 2 %
HCT: 35.3 % — ABNORMAL LOW (ref 36.0–46.0)
Hemoglobin: 11.9 g/dL — ABNORMAL LOW (ref 12.0–15.0)
Lymphocytes Relative: 36 %
Lymphs Abs: 2.9 10*3/uL (ref 0.7–4.0)
MCH: 29 pg (ref 26.0–34.0)
MCHC: 33.7 g/dL (ref 30.0–36.0)
MCV: 85.9 fL (ref 78.0–100.0)
MONO ABS: 0.5 10*3/uL (ref 0.1–1.0)
Monocytes Relative: 6 %
Neutro Abs: 4.5 10*3/uL (ref 1.7–7.7)
Neutrophils Relative %: 56 %
PLATELETS: 221 10*3/uL (ref 150–400)
RBC: 4.11 MIL/uL (ref 3.87–5.11)
RDW: 13.4 % (ref 11.5–15.5)
WBC: 8 10*3/uL (ref 4.0–10.5)

## 2015-10-14 LAB — COMPREHENSIVE METABOLIC PANEL
ALT: 53 U/L (ref 14–54)
AST: 70 U/L — AB (ref 15–41)
Albumin: 3.7 g/dL (ref 3.5–5.0)
Alkaline Phosphatase: 47 U/L (ref 38–126)
Anion gap: 11 (ref 5–15)
BILIRUBIN TOTAL: 0.6 mg/dL (ref 0.3–1.2)
BUN: 7 mg/dL (ref 6–20)
CO2: 24 mmol/L (ref 22–32)
Calcium: 9.4 mg/dL (ref 8.9–10.3)
Chloride: 103 mmol/L (ref 101–111)
Creatinine, Ser: 0.75 mg/dL (ref 0.44–1.00)
GFR calc Af Amer: >60 mL/min (ref >60)
Glucose, Bld: 102 mg/dL — ABNORMAL HIGH (ref 65–99)
Potassium: 4 mmol/L (ref 3.5–5.1)
Sodium: 138 mmol/L (ref 135–145)
TOTAL PROTEIN: 6.4 g/dL — AB (ref 6.5–8.1)

## 2015-10-14 LAB — URINALYSIS, ROUTINE W REFLEX MICROSCOPIC
BILIRUBIN URINE: NEGATIVE
Glucose, UA: NEGATIVE mg/dL
HGB URINE DIPSTICK: NEGATIVE
Ketones, ur: NEGATIVE mg/dL
Nitrite: NEGATIVE
Protein, ur: NEGATIVE mg/dL
SPECIFIC GRAVITY, URINE: 1.024 (ref 1.005–1.030)
UROBILINOGEN UA: 0.2 mg/dL (ref 0.0–1.0)
pH: 8 (ref 5.0–8.0)

## 2015-10-14 LAB — PREGNANCY, URINE: PREG TEST UR: NEGATIVE

## 2015-10-14 LAB — LIPASE, BLOOD: Lipase: 27 U/L (ref 11–51)

## 2015-10-14 MED ORDER — ACETAMINOPHEN 325 MG PO TABS: 650.0000 mg | ORAL_TABLET | Freq: Four times a day (QID) | ORAL | Status: DC | PRN

## 2015-10-14 MED ORDER — KCL IN DEXTROSE-NACL 20-5-0.45 MEQ/L-%-% IV SOLN
INTRAVENOUS | Status: DC
  Administered 2015-10-14 – 2015-10-15 (×3): via INTRAVENOUS
  Filled 2015-10-14 (×6): qty 1000

## 2015-10-14 MED ORDER — DEXTROSE 5 % IV SOLN
2.0000 g | INTRAVENOUS | Status: AC
  Administered 2015-10-15: 2 g via INTRAVENOUS
  Filled 2015-10-14: qty 2

## 2015-10-14 MED ORDER — ONDANSETRON HCL 4 MG/2ML IJ SOLN
4.0000 mg | Freq: Once | INTRAMUSCULAR | Status: AC
  Administered 2015-10-14: 4 mg via INTRAVENOUS
  Filled 2015-10-14: qty 2

## 2015-10-14 MED ORDER — OXYCODONE HCL 5 MG PO TABS
5.0000 mg | ORAL_TABLET | ORAL | Status: DC | PRN
  Administered 2015-10-14: 10 mg via ORAL
  Filled 2015-10-14: qty 2

## 2015-10-14 MED ORDER — HYDROMORPHONE HCL 1 MG/ML IJ SOLN
1.0000 mg | Freq: Once | INTRAMUSCULAR | Status: DC
  Filled 2015-10-14: qty 1

## 2015-10-14 MED ORDER — SODIUM CHLORIDE 0.9 % IV BOLUS (SEPSIS)
500.0000 mL | Freq: Once | INTRAVENOUS | Status: AC
  Administered 2015-10-14: 500 mL via INTRAVENOUS

## 2015-10-14 MED ORDER — DIPHENHYDRAMINE HCL 50 MG/ML IJ SOLN: 25.0000 mg | Freq: Four times a day (QID) | INTRAMUSCULAR | Status: DC | PRN

## 2015-10-14 MED ORDER — DEXTROSE 5 % IV SOLN
2.0000 g | INTRAVENOUS | Status: DC
  Filled 2015-10-14: qty 2

## 2015-10-14 MED ORDER — MORPHINE SULFATE (PF) 4 MG/ML IV SOLN
4.0000 mg | Freq: Once | INTRAVENOUS | Status: AC
  Administered 2015-10-14: 4 mg via INTRAVENOUS
  Filled 2015-10-14: qty 1

## 2015-10-14 MED ORDER — ENOXAPARIN SODIUM 40 MG/0.4ML ~~LOC~~ SOLN
40.0000 mg | Freq: Once | SUBCUTANEOUS | Status: AC
  Administered 2015-10-14: 40 mg via SUBCUTANEOUS
  Filled 2015-10-14: qty 0.4

## 2015-10-14 MED ORDER — ACETAMINOPHEN 650 MG RE SUPP: 650.0000 mg | Freq: Four times a day (QID) | RECTAL | Status: DC | PRN

## 2015-10-14 MED ORDER — DIPHENHYDRAMINE HCL 25 MG PO CAPS
25.0000 mg | ORAL_CAPSULE | Freq: Four times a day (QID) | ORAL | Status: DC | PRN
  Administered 2015-10-16: 25 mg via ORAL
  Filled 2015-10-14: qty 1

## 2015-10-14 MED ORDER — MORPHINE SULFATE (PF) 2 MG/ML IV SOLN
1.0000 mg | INTRAVENOUS | Status: DC | PRN
  Administered 2015-10-14 – 2015-10-17 (×8): 2 mg via INTRAVENOUS
  Filled 2015-10-14 (×8): qty 1

## 2015-10-14 MED ORDER — ONDANSETRON HCL 4 MG/2ML IJ SOLN
4.0000 mg | Freq: Four times a day (QID) | INTRAMUSCULAR | Status: DC | PRN
  Administered 2015-10-14 – 2015-10-15 (×2): 4 mg via INTRAVENOUS
  Filled 2015-10-14: qty 2

## 2015-10-14 MED ORDER — SIMETHICONE 80 MG PO CHEW
40.0000 mg | CHEWABLE_TABLET | Freq: Four times a day (QID) | ORAL | Status: DC | PRN
  Administered 2015-10-16: 40 mg via ORAL
  Filled 2015-10-14: qty 1

## 2015-10-14 MED ORDER — ONDANSETRON 4 MG PO TBDP
4.0000 mg | ORAL_TABLET | Freq: Four times a day (QID) | ORAL | Status: DC | PRN
  Filled 2015-10-14: qty 1

## 2015-10-14 NOTE — Care Management Note (Signed)
Case Management Note  Patient Details  Name: Jessica Vasquez S Kolenovic MRN: [id number] Date of Birth: 1991/10/10  Subjective/Objective:     Date: 10/14/15 Spoke with patient at the bedside. Introduced self as Sports coachcase manager and explained role in discharge planning and how to be reached. Verified patient lives in town, with two children., Expressed potential need for no other DME. Verified patient anticipates to go home with family at time of discharge and will have full-time  supervision by family friends neighbors at this time to best of their knowledge. Patient  denied needing help with their medication. Patient drives  to MD appointments.  Verified patient has no PCP, [facility] at 11:30.   Plan: CM will continue to follow for discharge planning and [facility] resources.                Action/Plan:   Expected Discharge Date:                  Expected Discharge Plan:  Home/Self Care  In-House Referral:     Discharge planning Services  CM Consult  Post Acute Care Choice:    Choice offered to:     DME Arranged:    DME Agency:     HH Arranged:    HH Agency:     Status of Service:  In process, will continue to follow  Medicare Important Message Given:    Date Medicare IM Given:    Medicare IM give by:    Date Additional Medicare IM Given:    Additional Medicare Important Message give by:     If discussed at Long Length of Stay Meetings, dates discussed:    Additional Comments:  Leone Havenaylor, Martie Muhlbauer [place], RN 10/14/2015, 2:46 PM

## 2015-10-14 NOTE — ED Provider Notes (Signed)
CSN: [id number]     Arrival date & time 10/14/15  0430 History   First MD Initiated Contact with Patient 10/14/15 [phone]     Chief Complaint  Patient presents with  . Cholelithiasis     (Consider location/radiation/quality/duration/timing/severity/associated sxs/prior Treatment) HPI Comments: Patient with a known history of gall stones, diagnosed 3 years ago by ultrasound. She reports intermittent pain since. Pain started last night and has been more intense. She has had nausea and vomiting. No fever. No diarrhea. She denies SOB, or chest pain. No urinary symptoms, vaginal discharge or vaginal bleeding.   The history is provided by the patient. No language interpreter was used.    Past Medical History  Diagnosis Date  . No pertinent past medical history   . Anemia   . Herpes     last outbreak two weeks ago ( mid march 2015)  . Hydradenitis    Past Surgical History  Procedure Laterality Date  . Nasal sugery     Family History  Problem Relation Age of Onset  . Gallstones Mother    Social History  Substance Use Topics  . Smoking status: Former Smoker    Quit date: 05/16/2011  . Smokeless tobacco: Never Used  . Alcohol Use: No   OB History    Gravida Para Term Preterm AB TAB SAB Ectopic Multiple Living   0 0 0 0 0 0 2     Review of Systems  Constitutional: Negative for fever.  Respiratory: Negative for shortness of breath.   Cardiovascular: Negative for chest pain.  Gastrointestinal: Positive for nausea, vomiting and abdominal pain. Negative for diarrhea.  Genitourinary: Negative for dysuria, vaginal bleeding and vaginal discharge.  Musculoskeletal: Negative for back pain.      Allergies  Review of patient's allergies indicates no known allergies.  Home Medications   Prior to Admission medications   Medication Sig Start Date End Date Taking? Authorizing Provider  fluticasone (CUTIVATE) 0.05 % cream Apply topically 2 (two) times daily. Patient not taking:  Reported on 01/24/2015 09/12/14   Linna Hoff, MD  ibuprofen (ADVIL,MOTRIN) 800 MG tablet Take 1 tablet (800 mg total) by mouth every 8 (eight) hours as needed. 09/15/14   Fayrene Helper, PA-C  minocycline (MINOCIN,DYNACIN) 100 MG capsule Take 1 capsule (100 mg total) by mouth 2 (two) times daily. Patient not taking: Reported on 01/24/2015 09/15/14   Fayrene Helper, PA-C  ondansetron (ZOFRAN-ODT) 4 MG disintegrating tablet Take 1 tablet (4 mg total) by mouth every 8 (eight) hours as needed for nausea or vomiting. 03/16/15   Earley Favor, NP  sulfamethoxazole-trimethoprim (BACTRIM DS,SEPTRA DS) 800-160 MG per tablet Take 1 tablet by mouth 2 (two) times daily. 03/16/15   Earley Favor, NP  traMADol (ULTRAM) 50 MG tablet Take 1 tablet (50 mg total) by mouth every 6 (six) hours as needed. 05/10/15   Tiffany Neva Seat, PA-C   BP 118/69 mmHg  Temp(Src) 98.5 F (36.9 C)  Resp 16  SpO2 100%  LMP 09/18/2015  Breastfeeding? No Physical Exam  Constitutional: She is oriented to person, place, and time. She appears well-developed and well-nourished.  HENT:  Head: Normocephalic.  Neck: Normal range of motion. Neck supple.  Cardiovascular: Normal rate and regular rhythm.   Pulmonary/Chest: Effort normal and breath sounds normal.  Abdominal: Soft. Bowel sounds are normal. There is tenderness. There is no rebound and no guarding.  Significant RUQ abdominal tenderness with guarding. Soft [id number] BS hypoactive.   Musculoskeletal: Normal range of motion.  Neurological: She is alert and oriented to person, place, and time.  Skin: Skin is warm and dry. No rash noted.  Psychiatric: She has a normal mood and affect.    ED Course  Procedures (including critical care time) Labs Review Labs Reviewed  COMPREHENSIVE METABOLIC PANEL  LIPASE, BLOOD  CBC WITH DIFFERENTIAL/PLATELET  PREGNANCY, URINE  URINALYSIS, ROUTINE W REFLEX MICROSCOPIC (NOT AT [facility])    Imaging Review No results found. I have personally reviewed and  evaluated these images and lab results as part of my medical decision-making.   EKG Interpretation None      MDM   Final diagnoses:  None    1. RUQ abdominal pain  Labs and ultrasound pending in patient with severe RUQ pain and tenderness with h/o gall stones. Will likely need surgical consultation given degree of tenderness. Patient care signed out at end of shift to oncoming PA.     Elpidio AnisShari Lougenia Morrissey, PA-C 10/14/15 [phone]  Dione Boozeavid Glick, MD 10/14/15 [phone]

## 2015-10-14 NOTE — ED Notes (Signed)
Attempted report 

## 2015-10-14 NOTE — H&P (Signed)
Jessica Vasquez September 03, 1991  [id number].   Primary Care MD: none Chief Complaint/Reason for Consult: cholelithiasis HPI: This is a 24 yo black female who has had intermittent abdominal pains for the last 3 years since having her daughter.  She was diagnosed with gallstones, but has never sought treatment.  She generally gets some pain after eating, but usually goes away.  Yesterday morning she started having pain again with nausea and vomiting.  It has continued to persist.  She states she had a fever at home, but none since then.  Her pain is worse after eating.  She presented to [facility].  Her labs are all normal.  Her US shows gallstones, with no evidence of cholecystitis, but her CBD is 71mm, but no definite CBD stone seen.  We have been asked to see her for admission.  ROS : Please see HPI, otherwise negative  Family History  Problem Relation Age of Onset  . Gallstones Mother     Past Medical History  Diagnosis Date  . No pertinent past medical history   . Anemia   . Herpes     last outbreak two weeks ago ( mid march 2015)  . Hydradenitis     Past Surgical History  Procedure Laterality Date  . Nasal sugery      Social History:  reports that she quit smoking about 4 years ago. She has never used smokeless tobacco. She reports that she does not drink alcohol or use illicit drugs.  Allergies: No Known Allergies   (Not in a hospital admission)  Blood pressure 121/69, pulse 67, temperature 98.1 F (36.7 C), temperature source Rectal, resp. rate 16, last menstrual period 09/18/2015, SpO2 100 %, not currently breastfeeding. Physical Exam: General: pleasant, WD, WN black female who is laying in bed in NAD HEENT: head is normocephalic, atraumatic.  Sclera are noninjected.  PERRL.  Ears and nose without any masses or lesions.  Mouth is pink and moist Heart: regular, rate, and rhythm.  Normal s1,s2. No obvious murmurs, gallops, or rubs noted.  Palpable radial and pedal  pulses bilaterally Lungs: CTAB, no wheezes, rhonchi, or rales noted.  Respiratory effort nonlabored Abd: soft, tender in epigastrium, ND, +BS, no masses, hernias, or organomegaly MS: all 4 extremities are symmetrical with no cyanosis, clubbing, or edema. Skin: warm and dry with no masses, lesions, or rashes Psych: A&Ox3 with an appropriate affect.    Results for orders placed or performed during the hospital encounter of 10/14/15 (from the past 48 hour(s))  Comprehensive metabolic panel     Status: Abnormal   Collection Time: 10/14/15  5:36 AM  Result Value Ref Range   Sodium 138 135 - 145 mmol/L   Potassium 4.0 3.5 - 5.1 mmol/L   Chloride 103 101 - 111 mmol/L   CO2 24 22 - 32 mmol/L   Glucose, Bld 102 (H) 65 - 99 mg/dL   BUN 7 6 - 20 mg/dL   Creatinine, Ser 0.75 0.44 - 1.00 mg/dL   Calcium 9.4 8.9 - 10.3 mg/dL   Total Protein 6.4 (L) 6.5 - 8.1 g/dL   Albumin 3.7 3.5 - 5.0 g/dL   AST 70 (H) 15 - 41 U/L   ALT 53 14 - 54 U/L   Alkaline Phosphatase 47 38 - 126 U/L   Total Bilirubin 0.6 0.3 - 1.2 mg/dL   GFR calc non Af Amer >60 >60 mL/min   GFR calc Af Amer >60 >60 mL/min    Comment: (NOTE) The  eGFR has been calculated using the CKD EPI equation. This calculation has not been validated in all clinical situations. eGFR's persistently <60 mL/min signify possible Chronic Kidney Disease.    Anion gap 11 5 - 15  Lipase, blood     Status: None   Collection Time: 10/14/15  5:36 AM  Result Value Ref Range   Lipase 27 11 - 51 U/L    Comment: Please note change in reference range.  CBC with Differential     Status: Abnormal   Collection Time: 10/14/15  5:36 AM  Result Value Ref Range   WBC 8.0 4.0 - 10.5 K/uL   RBC 4.11 3.87 - 5.11 MIL/uL   Hemoglobin 11.9 (L) 12.0 - 15.0 g/dL   HCT 35.3 (L) 36.0 - 46.0 %   MCV 85.9 78.0 - 100.0 fL   MCH 29.0 26.0 - 34.0 pg   MCHC 33.7 30.0 - 36.0 g/dL   RDW 13.4 11.5 - 15.5 %   Platelets 221 150 - 400 K/uL   Neutrophils Relative % 56 %    Neutro Abs 4.5 1.7 - 7.7 K/uL   Lymphocytes Relative 36 %   Lymphs Abs 2.9 0.7 - 4.0 K/uL   Monocytes Relative 6 %   Monocytes Absolute 0.5 0.1 - 1.0 K/uL   Eosinophils Relative 2 %   Eosinophils Absolute 0.1 0.0 - 0.7 K/uL   Basophils Relative 0 %   Basophils Absolute 0.0 0.0 - 0.1 K/uL  Pregnancy, urine     Status: None   Collection Time: 10/14/15  6:40 AM  Result Value Ref Range   Preg Test, Ur NEGATIVE NEGATIVE    Comment:        THE SENSITIVITY OF THIS METHODOLOGY IS >20 mIU/mL.   Urinalysis, Routine w reflex microscopic     Status: Abnormal   Collection Time: 10/14/15  6:50 AM  Result Value Ref Range   Color, Urine YELLOW YELLOW   APPearance TURBID (A) CLEAR   Specific Gravity, Urine 1.024 1.005 - 1.030   pH 8.0 5.0 - 8.0   Glucose, UA NEGATIVE NEGATIVE mg/dL   Hgb urine dipstick NEGATIVE NEGATIVE   Bilirubin Urine NEGATIVE NEGATIVE   Ketones, ur NEGATIVE NEGATIVE mg/dL   Protein, ur NEGATIVE NEGATIVE mg/dL   Urobilinogen, UA 0.2 0.0 - 1.0 mg/dL   Nitrite NEGATIVE NEGATIVE   Leukocytes, UA SMALL (A) NEGATIVE  Urine microscopic-add on     Status: Abnormal   Collection Time: 10/14/15  6:50 AM  Result Value Ref Range   Squamous Epithelial / LPF FEW (A) RARE   WBC, UA 3-6 <3 WBC/hpf   Bacteria, UA MANY (A) RARE   Urine-Other AMORPHOUS URATES/PHOSPHATES    US Abdomen Limited  10/14/2015  CLINICAL DATA:  RIGHT upper quadrant pain beginning yesterday. History of gallstones. EXAM: US ABDOMEN LIMITED - RIGHT UPPER QUADRANT COMPARISON:  None. FINDINGS: Technologist reports limited examination due to inability to hold breath and, remain still due to abdominal pain. Gallbladder: Multiple echogenic layering gallstones with acoustic shadowing, largest measuring 12 mm. No gallbladder distention, wall thickening or pericholecystic fluid. Sonographic Murphy's sign elicited. Common bile duct: Diameter: Enlarged, 11 mm. Liver: No focal lesion identified. Within normal limits in  parenchymal echogenicity. Hepatopetal portal vein. IMPRESSION: Cholelithiasis and sonographic Murphy sign concerning for acute cholecystitis. Dilated Common bile duct 11 mm, which could represent recently passed or, distal Common bile duct stone. Electronically Signed   By: Elon Alas M.D.   On: 10/14/2015 06:13  Assessment/Plan 1. Biliary colic -patient is not really tender in RUQ, but is in epigastrium.  Suspect she is having biliary colic and maybe chronic cholecystitis.  She also may have passed or may have a CBD stone given her CBD is 41m.  However, her LFTs are normal.  We will recheck her labs in the morning.  We will likely proceed with lap chole and IOC tomorrow. -clear liquids today and NPO p MN -no abx currently given no definite evidence of cholecystitis. -lovenox x1 dose today and hold for OR tomorrow -prn meds and IVFs  Jenayah Antu E 10/14/2015, 7:45 AM Pager: [phone]

## 2015-10-14 NOTE — ED Provider Notes (Signed)
Care assumed from RitchieShari Upstill, [place] JerseyPA-C.  Jessica Vasquez is a 24 y.o. female presents with intermittent epigastric abd pain since last night.  Pt reports a hx of gallstones diagnosed approx 3 years ago.  She has had associated nausea and vomiting but denies fevers, chills, diarrhea.  No recent travel.  Pt with significant and focal RUQ tenderness on first assessment.  Last oral intake was 9pm last night.    Physical Exam  BP 121/69 mmHg  Pulse 67  Temp(Src) 98.1 F (36.7 C) (Rectal)  Resp 16  SpO2 100%  LMP 09/18/2015  Breastfeeding? No  Physical Exam   Face to face Exam:   General: Awake, moaning in pain  HEENT: Atraumatic  Resp: Normal effort  Abd: Nondistended, exquisitely tender in the epigastrium and RUQ  Neuro:No focal weakness  Lymph: No adenopathy    ED Course  Procedures  1. Cholecystitis   2. Right upper quadrant abdominal pain   3. Non-intractable vomiting with nausea, vomiting of unspecified type     MDM  Plan: Pt US pending.  Likely will need surgical consult.    6:32 AM Ultrasound with cholelithiasis and sonographic Murphy sign concerning for acute cholecystitis. Also noted a dilated common bile duct to 11 mm.  Will consult with surgery.    7:22 AM Discussed with Surgery who will evaluate.    8:20 AM Surgery to admit.    BP 121/69 mmHg  Pulse 67  Temp(Src) 98.1 F (36.7 C) (Rectal)  Resp 16  SpO2 100%  LMP 09/18/2015  Breastfeeding? No   Dierdre ForthHannah Mohsin Crum, PA-C 10/14/15 0820  Dione Boozeavid Glick, MD 10/14/15 [phone]

## 2015-10-14 NOTE — Progress Notes (Signed)
Jessica Vasquez is a 24 y.o. female patient admitted from ED awake, alert - oriented  X 4 - no acute distress noted.  VSS - Blood pressure 105/63, pulse 65, temperature 98.3 F (36.8 C), temperature source Oral, resp. rate 16, height 5\' 10"  (1.778 m), weight 90.719 kg (200 lb), last menstrual period 09/18/2015, SpO2 100 %, not currently breastfeeding.    IV in place, occlusive dsg intact without redness.  Orientation to room, and floor completed with information packet given to patient/family.  Patient declined safety video at this time.  Admission INP armband ID verified with patient/family, and in place.   SR up x 2, fall assessment complete, with patient and family able to verbalize understanding of risk associated with falls, and verbalized understanding to call nsg before up out of bed.  Call light within reach, patient able to voice, and demonstrate understanding.     Will cont to eval and treat per MD orders.  Al DecantFlores, Jaxsyn Azam F, [place]RN 10/14/2015 10:03 AM

## 2015-10-14 NOTE — ED Notes (Signed)
Patient transported to Ultrasound 

## 2015-10-14 NOTE — Progress Notes (Signed)
Report called to ED  

## 2015-10-14 NOTE — ED Notes (Signed)
Patient reports that she has gallstones that she was diagnosed with 3 years ago (but were not removed), and that the pain returned yesterday. Patient took Percocet x 2 with little relief, but pain returned. Patient reports 10/10 on 0-10 pain scale in mid abdomen. Also, reports nausea/vomiting.

## 2015-10-15 ENCOUNTER — Inpatient Hospital Stay (HOSPITAL_COMMUNITY): Payer: 59 | Admitting: Anesthesiology

## 2015-10-15 ENCOUNTER — Encounter (HOSPITAL_COMMUNITY): Payer: Self-pay | Admitting: Anesthesiology

## 2015-10-15 ENCOUNTER — Encounter (HOSPITAL_COMMUNITY): Admission: EM | Disposition: A | Discharge status: Home / Self Care | Payer: Self-pay | Source: Home / Self Care

## 2015-10-15 ENCOUNTER — Inpatient Hospital Stay (HOSPITAL_COMMUNITY): Payer: 59

## 2015-10-15 DIAGNOSIS — K8050 Calculus of bile duct without cholangitis or cholecystitis without obstruction: Secondary | ICD-10-CM

## 2015-10-15 HISTORY — PX: CHOLECYSTECTOMY: SHX55

## 2015-10-15 LAB — CBC
HCT: 35.9 % — ABNORMAL LOW (ref 36.0–46.0)
HEMOGLOBIN: 11.6 g/dL — AB (ref 12.0–15.0)
MCH: 28.5 pg (ref 26.0–34.0)
MCHC: 32.3 g/dL (ref 30.0–36.0)
MCV: 88.2 fL (ref 78.0–100.0)
Platelets: 217 10*3/uL (ref 150–400)
RBC: 4.07 MIL/uL (ref 3.87–5.11)
RDW: 13.5 % (ref 11.5–15.5)
WBC: 4.9 10*3/uL (ref 4.0–10.5)

## 2015-10-15 LAB — COMPREHENSIVE METABOLIC PANEL
ALBUMIN: 3.2 g/dL — AB (ref 3.5–5.0)
ALT: 82 U/L — AB (ref 14–54)
ANION GAP: 3 — AB (ref 5–15)
AST: 68 U/L — ABNORMAL HIGH (ref 15–41)
Alkaline Phosphatase: 53 U/L (ref 38–126)
BUN: <5 mg/dL — ABNORMAL LOW (ref 6–20)
CHLORIDE: 103 mmol/L (ref 101–111)
CO2: 28 mmol/L (ref 22–32)
Calcium: 8.3 mg/dL — ABNORMAL LOW (ref 8.9–10.3)
Creatinine, Ser: 0.8 mg/dL (ref 0.44–1.00)
GFR calc non Af Amer: >60 mL/min (ref >60)
Glucose, Bld: 90 mg/dL (ref 65–99)
Potassium: 4.2 mmol/L (ref 3.5–5.1)
SODIUM: 134 mmol/L — AB (ref 135–145)
Total Bilirubin: 0.7 mg/dL (ref 0.3–1.2)
Total Protein: 6 g/dL — ABNORMAL LOW (ref 6.5–8.1)

## 2015-10-15 LAB — SURGICAL PCR SCREEN
MRSA, PCR: NEGATIVE
Staphylococcus aureus: NEGATIVE

## 2015-10-15 SURGERY — LAPAROSCOPIC CHOLECYSTECTOMY WITH INTRAOPERATIVE CHOLANGIOGRAM
Anesthesia: General

## 2015-10-15 MED ORDER — LIDOCAINE HCL (CARDIAC) 20 MG/ML IV SOLN
INTRAVENOUS | Status: DC | PRN
  Administered 2015-10-15: 60 mg via INTRAVENOUS
  Administered 2015-10-15: 40 mg via INTRAVENOUS

## 2015-10-15 MED ORDER — PROPOFOL 10 MG/ML IV BOLUS
INTRAVENOUS | Status: DC | PRN
  Administered 2015-10-15: 15 mg via INTRAVENOUS

## 2015-10-15 MED ORDER — SODIUM CHLORIDE 0.9 % IV SOLN
INTRAVENOUS | Status: DC | PRN
  Administered 2015-10-15: 24 mL

## 2015-10-15 MED ORDER — ONDANSETRON HCL 4 MG/2ML IJ SOLN: 4.0000 mg | Freq: Once | INTRAMUSCULAR | Status: DC | PRN

## 2015-10-15 MED ORDER — MIDAZOLAM HCL 5 MG/5ML IJ SOLN
INTRAMUSCULAR | Status: DC | PRN
  Administered 2015-10-15: 2 mg via INTRAVENOUS

## 2015-10-15 MED ORDER — GLUCAGON HCL RDNA (DIAGNOSTIC) 1 MG IJ SOLR
INTRAMUSCULAR | Status: AC
  Filled 2015-10-15: qty 1

## 2015-10-15 MED ORDER — SODIUM CHLORIDE 0.9 % IJ SOLN
INTRAMUSCULAR | Status: AC
  Filled 2015-10-15: qty 10

## 2015-10-15 MED ORDER — ROCURONIUM BROMIDE 50 MG/5ML IV SOLN
INTRAVENOUS | Status: AC
  Filled 2015-10-15: qty 1

## 2015-10-15 MED ORDER — NEOSTIGMINE METHYLSULFATE 10 MG/10ML IV SOLN
INTRAVENOUS | Status: DC | PRN
  Administered 2015-10-15: 4 mg via INTRAVENOUS

## 2015-10-15 MED ORDER — HYDROMORPHONE HCL 1 MG/ML IJ SOLN
INTRAMUSCULAR | Status: AC
  Filled 2015-10-15: qty 1

## 2015-10-15 MED ORDER — 0.9 % SODIUM CHLORIDE (POUR BTL) OPTIME
TOPICAL | Status: DC | PRN
  Administered 2015-10-15: 1000 mL

## 2015-10-15 MED ORDER — BUPIVACAINE-EPINEPHRINE 0.25% -1:200000 IJ SOLN
INTRAMUSCULAR | Status: DC | PRN
  Administered 2015-10-15: 8 mL

## 2015-10-15 MED ORDER — INDOMETHACIN 50 MG RE SUPP
100.0000 mg | Freq: Once | RECTAL | Status: AC
  Administered 2015-10-16: 100 mg via RECTAL
  Filled 2015-10-15: qty 2

## 2015-10-15 MED ORDER — LACTATED RINGERS IV SOLN
INTRAVENOUS | Status: DC
  Administered 2015-10-15: 08:00:00 via INTRAVENOUS

## 2015-10-15 MED ORDER — HYDROMORPHONE HCL 1 MG/ML IJ SOLN
0.2500 mg | INTRAMUSCULAR | Status: DC | PRN
  Administered 2015-10-15 (×2): 0.5 mg via INTRAVENOUS

## 2015-10-15 MED ORDER — PHENYLEPHRINE 40 MCG/ML (10ML) SYRINGE FOR IV PUSH (FOR BLOOD PRESSURE SUPPORT)
PREFILLED_SYRINGE | INTRAVENOUS | Status: AC
  Filled 2015-10-15: qty 10

## 2015-10-15 MED ORDER — SODIUM CHLORIDE 0.9 % IR SOLN
Status: DC | PRN
  Administered 2015-10-15: 1000 mL

## 2015-10-15 MED ORDER — SODIUM CHLORIDE 0.9 % IV SOLN
1.5000 g | Freq: Once | INTRAVENOUS | Status: AC
  Administered 2015-10-16: 1.5 g via INTRAVENOUS
  Filled 2015-10-15 (×2): qty 1.5

## 2015-10-15 MED ORDER — LACTATED RINGERS IV SOLN
INTRAVENOUS | Status: DC
  Administered 2015-10-15 – 2015-10-16 (×4): via INTRAVENOUS

## 2015-10-15 MED ORDER — GLYCOPYRROLATE 0.2 MG/ML IJ SOLN
INTRAMUSCULAR | Status: DC | PRN
  Administered 2015-10-15: 0.6 mg via INTRAVENOUS

## 2015-10-15 MED ORDER — FENTANYL CITRATE (PF) 100 MCG/2ML IJ SOLN
INTRAMUSCULAR | Status: DC | PRN
  Administered 2015-10-15: 50 ug via INTRAVENOUS
  Administered 2015-10-15: 150 ug via INTRAVENOUS
  Administered 2015-10-15: 50 ug via INTRAVENOUS

## 2015-10-15 MED ORDER — PROPOFOL 10 MG/ML IV BOLUS
INTRAVENOUS | Status: AC
  Filled 2015-10-15: qty 20

## 2015-10-15 MED ORDER — BUPIVACAINE-EPINEPHRINE (PF) 0.25% -1:200000 IJ SOLN
INTRAMUSCULAR | Status: AC
  Filled 2015-10-15: qty 30

## 2015-10-15 MED ORDER — GLYCOPYRROLATE 0.2 MG/ML IJ SOLN
INTRAMUSCULAR | Status: AC
  Filled 2015-10-15: qty 3

## 2015-10-15 MED ORDER — EPHEDRINE SULFATE 50 MG/ML IJ SOLN
INTRAMUSCULAR | Status: AC
  Filled 2015-10-15: qty 1

## 2015-10-15 MED ORDER — FENTANYL CITRATE (PF) 250 MCG/5ML IJ SOLN
INTRAMUSCULAR | Status: AC
  Filled 2015-10-15: qty 5

## 2015-10-15 MED ORDER — PHENYLEPHRINE HCL 10 MG/ML IJ SOLN
INTRAMUSCULAR | Status: DC | PRN
  Administered 2015-10-15 (×2): 80 ug via INTRAVENOUS

## 2015-10-15 MED ORDER — SUCCINYLCHOLINE CHLORIDE 20 MG/ML IJ SOLN
INTRAMUSCULAR | Status: AC
  Filled 2015-10-15: qty 1

## 2015-10-15 MED ORDER — MEPERIDINE HCL 25 MG/ML IJ SOLN: 6.2500 mg | INTRAMUSCULAR | Status: DC | PRN

## 2015-10-15 MED ORDER — ONDANSETRON HCL 4 MG/2ML IJ SOLN
INTRAMUSCULAR | Status: AC
  Filled 2015-10-15: qty 2

## 2015-10-15 MED ORDER — SODIUM CHLORIDE 0.9 % IV SOLN: INTRAVENOUS | Status: DC

## 2015-10-15 MED ORDER — GLUCAGON HCL RDNA (DIAGNOSTIC) 1 MG IJ SOLR
INTRAMUSCULAR | Status: DC | PRN
  Administered 2015-10-15: 1 mg via INTRAVENOUS

## 2015-10-15 MED ORDER — LIDOCAINE HCL (CARDIAC) 20 MG/ML IV SOLN
INTRAVENOUS | Status: AC
  Filled 2015-10-15: qty 5

## 2015-10-15 MED ORDER — MIDAZOLAM HCL 2 MG/2ML IJ SOLN
INTRAMUSCULAR | Status: AC
  Filled 2015-10-15: qty 4

## 2015-10-15 MED ORDER — ROCURONIUM BROMIDE 100 MG/10ML IV SOLN
INTRAVENOUS | Status: DC | PRN
  Administered 2015-10-15: 50 mg via INTRAVENOUS

## 2015-10-15 SURGICAL SUPPLY — 48 items
APL SKNCLS STERI-STRIP NONHPOA (GAUZE/BANDAGES/DRESSINGS) ×1
APPLIER CLIP ROT 10 11.4 M/L (STAPLE) ×3
APR CLP MED LRG 11.4X10 (STAPLE) ×1
BAG SPEC RTRVL LRG 6X4 10 (ENDOMECHANICALS) ×1
BENZOIN TINCTURE PRP APPL 2/3 (GAUZE/BANDAGES/DRESSINGS) ×3 IMPLANT
BLADE SURG ROTATE 9660 (MISCELLANEOUS) IMPLANT
CANISTER SUCTION 2500CC (MISCELLANEOUS) ×3 IMPLANT
CHLORAPREP W/TINT 26ML (MISCELLANEOUS) ×3 IMPLANT
CLIP APPLIE ROT 10 11.4 M/L (STAPLE) ×1 IMPLANT
CLOSURE WOUND 1/2 X4 (GAUZE/BANDAGES/DRESSINGS) ×1
COVER MAYO STAND STRL (DRAPES) ×3 IMPLANT
COVER SURGICAL LIGHT HANDLE (MISCELLANEOUS) ×3 IMPLANT
DRAPE C-ARM 42X72 X-RAY (DRAPES) ×3 IMPLANT
DRSG OPSITE 4X5.5 SM (GAUZE/BANDAGES/DRESSINGS) ×2 IMPLANT
DRSG OPSITE POSTOP 4X8 (GAUZE/BANDAGES/DRESSINGS) ×3 IMPLANT
DRSG TEGADERM 2-3/8X2-3/4 SM (GAUZE/BANDAGES/DRESSINGS) ×9 IMPLANT
DRSG TEGADERM 4X4.75 (GAUZE/BANDAGES/DRESSINGS) ×3 IMPLANT
ELECT REM PT RETURN 9FT ADLT (ELECTROSURGICAL) ×3
ELECTRODE REM PT RTRN 9FT ADLT (ELECTROSURGICAL) ×1 IMPLANT
FILTER SMOKE EVAC LAPAROSHD (FILTER) ×3 IMPLANT
GAUZE SPONGE 2X2 8PLY STRL LF (GAUZE/BANDAGES/DRESSINGS) ×1 IMPLANT
GLOVE BIO SURGEON STRL SZ 6.5 (GLOVE) ×8 IMPLANT
GLOVE BIO SURGEON STRL SZ7 (GLOVE) ×3 IMPLANT
GLOVE BIO SURGEONS STRL SZ 6.5 (GLOVE) ×4
GLOVE BIOGEL PI IND STRL 7.5 (GLOVE) ×1 IMPLANT
GLOVE BIOGEL PI INDICATOR 7.5 (GLOVE) ×2
GOWN STRL REUS W/ TWL LRG LVL3 (GOWN DISPOSABLE) ×3 IMPLANT
GOWN STRL REUS W/TWL LRG LVL3 (GOWN DISPOSABLE) ×9
KIT BASIN OR (CUSTOM PROCEDURE TRAY) ×5 IMPLANT
KIT ROOM TURNOVER OR (KITS) ×3 IMPLANT
NS IRRIG 1000ML POUR BTL (IV SOLUTION) ×3 IMPLANT
PAD ARMBOARD 7.5X6 YLW CONV (MISCELLANEOUS) ×3 IMPLANT
POUCH SPECIMEN RETRIEVAL 10MM (ENDOMECHANICALS) ×3 IMPLANT
SCISSORS LAP 5X35 DISP (ENDOMECHANICALS) ×3 IMPLANT
SET CHOLANGIOGRAPH 5 50 .035 (SET/KITS/TRAYS/PACK) ×3 IMPLANT
SET IRRIG TUBING LAPAROSCOPIC (IRRIGATION / IRRIGATOR) ×3 IMPLANT
SLEEVE ENDOPATH XCEL 5M (ENDOMECHANICALS) ×3 IMPLANT
SPECIMEN JAR SMALL (MISCELLANEOUS) ×3 IMPLANT
SPONGE GAUZE 2X2 STER 10/PKG (GAUZE/BANDAGES/DRESSINGS) ×2
STRIP CLOSURE SKIN 1/2X4 (GAUZE/BANDAGES/DRESSINGS) ×2 IMPLANT
SUT MNCRL AB 4-0 PS2 18 (SUTURE) ×3 IMPLANT
TOWEL OR 17X24 6PK STRL BLUE (TOWEL DISPOSABLE) ×3 IMPLANT
TOWEL OR 17X26 10 PK STRL BLUE (TOWEL DISPOSABLE) ×3 IMPLANT
TRAY LAPAROSCOPIC MC (CUSTOM PROCEDURE TRAY) ×3 IMPLANT
TROCAR XCEL BLUNT TIP 100MML (ENDOMECHANICALS) ×3 IMPLANT
TROCAR XCEL NON-BLD 11X100MML (ENDOMECHANICALS) ×3 IMPLANT
TROCAR XCEL NON-BLD 5MMX100MML (ENDOMECHANICALS) ×7 IMPLANT
TUBING INSUFFLATION (TUBING) ×3 IMPLANT

## 2015-10-15 NOTE — Op Note (Signed)
Laparoscopic Cholecystectomy with IOC Procedure Note  Indications: This patient presents with symptomatic gallbladder disease and will undergo laparoscopic cholecystectomy.  Pre-operative Diagnosis: Calculus of gallbladder with other cholecystitis and obstruction  Post-operative Diagnosis: Same  Surgeon: Henryetta Corriveau K.   Assistants: none  Anesthesia: General endotracheal anesthesia  ASA Class: 1  Procedure Details  The patient was seen again in the Holding Room. The risks, benefits, complications, treatment options, and expected outcomes were discussed with the patient. The possibilities of reaction to medication, pulmonary aspiration, perforation of viscus, bleeding, recurrent infection, finding a normal gallbladder, the need for additional procedures, failure to diagnose a condition, the possible need to convert to an open procedure, and creating a complication requiring transfusion or operation were discussed with the patient. The likelihood of improving the patient's symptoms with return to their baseline status is good.  The patient and/or family concurred with the proposed plan, giving informed consent. The site of surgery properly noted. The patient was taken to Operating Room, identified as Skylor S Elpers and the CYNARA TATHAMverified as Laparoscopic Cholecystectomy with Intraoperative Cholangiogram. A Time Out was held and the above information confirmed.  Prior to the induction of general anesthesia, antibiotic prophylaxis was administered. General endotracheal anesthesia was then administered and tolerated well. After the induction, the abdomen was prepped with Chloraprep and draped in the sterile fashion. The patient was positioned in the supine position.  Local anesthetic agent was injected into the skin near the umbilicus and an incision made. We dissected down to the abdominal fascia with blunt dissection.  The fascia was incised vertically and we entered the peritoneal cavity  bluntly.  A pursestring suture of 0-Vicryl was placed around the fascial opening.  The Hasson cannula was inserted and secured with the stay suture.  Pneumoperitoneum was then created with CO2 and tolerated well without any adverse changes in the patient's vital signs. An 11-mm port was placed in the subxiphoid position.  Two 5-mm ports were placed in the right upper quadrant. All skin incisions were infiltrated with a local anesthetic agent before making the incision and placing the trocars.   We positioned the patient in reverse Trendelenburg, tilted slightly to the patient's left.  The gallbladder was identified, the fundus grasped and retracted cephalad. There are many stones visible in the gallbladder with some omental adhesions to the surface.  Adhesions were lysed bluntly and with the electrocautery where indicated, taking care not to injure any adjacent organs or viscus. The infundibulum was grasped and retracted laterally, exposing the peritoneum overlying the triangle of Calot. This was then divided and exposed in a blunt fashion. A critical view of the cystic duct and cystic artery was obtained.  The cystic duct was clearly identified and bluntly dissected circumferentially. The cystic duct was ligated with a clip distally.   An incision was made in the cystic duct and the [facility] cholangiogram catheter introduced. The catheter was secured using a clip. A cholangiogram was then obtained which showed good visualization of the distal and proximal biliary tree.  However, there some obvious filling defects in the distal common bile duct with obstruction of the duct.  We administered 1 amp of glucagon, waited three minutes, then repeated the cholangiogram.  Contrast flowed easily into the duodenum but the filling defect seemed to persist. The catheter was then removed.   The cystic duct was then ligated with clips and divided. The cystic artery was identified, dissected free, ligated with clips and divided as  well.   The gallbladder  was dissected from the liver bed in retrograde fashion with the electrocautery. The gallbladder was removed and placed in an Endocatch sac. The liver bed was irrigated and inspected. Hemostasis was achieved with the electrocautery. Copious irrigation was utilized and was repeatedly aspirated until clear.  The gallbladder and Endocatch sac were then removed through the umbilical port site.  The pursestring suture was used to close the umbilical fascia.    We again inspected the right upper quadrant for hemostasis.  Pneumoperitoneum was released as we removed the trocars.  4-0 Monocryl was used to close the skin.   Benzoin, steri-strips, and clean dressings were applied. The patient was then extubated and brought to the recovery room in stable condition. Instrument, sponge, and needle counts were correct at closure and at the conclusion of the case.   GI will be consulted regarding possible ERCP.  Findings: Cholecystitis with Cholelithiasis  Estimated Blood Loss: Minimal         Drains: none         Specimens: Gallbladder           Complications: None; patient tolerated the procedure well.         Disposition: PACU - hemodynamically stable.         Condition: stable  Wilmon ArmsMatthew K. Corliss Skainssuei, MD, [facility]  General/ Trauma Surgery  10/15/2015 9:26 AM

## 2015-10-15 NOTE — Anesthesia Procedure Notes (Addendum)
Procedure Name: Intubation Date/Time: 10/15/2015 8:07 AM Performed by: Rush Farmer E Pre-anesthesia Checklist: Patient identified, Emergency Drugs available, Suction available, Patient being monitored and Timeout performed Patient Re-evaluated:Patient Re-evaluated prior to inductionOxygen Delivery Method: Circle system utilized Preoxygenation: Pre-oxygenation with 100% oxygen Intubation Type: IV induction Ventilation: Mask ventilation without difficulty Laryngoscope Size: Mac and 4 Grade View: Grade I Tube type: Oral Tube size: 7.0 mm Number of attempts: 1 Airway Equipment and Method: Stylet and LTA kit utilized Placement Confirmation: ETT inserted through vocal cords under direct vision,  positive ETCO2 and breath sounds checked- equal and bilateral Secured at: 21 cm Tube secured with: Tape Dental Injury: Teeth and Oropharynx as per pre-operative assessment

## 2015-10-15 NOTE — Anesthesia Postprocedure Evaluation (Signed)
Anesthesia Post Note  Patient: Jessica Vasquez  Procedure(s) Performed: Procedure(s) (LRB): LAPAROSCOPIC CHOLECYSTECTOMY WITH INTRAOPERATIVE CHOLANGIOGRAM (N/A)  Anesthesia type: general  Patient location: PACU  Post pain: Pain level controlled  Post assessment: Patient's Cardiovascular Status Stable  Last Vitals:  Filed Vitals:   10/15/15 1002  BP: 119/63  Pulse: 64  Temp:   Resp: 13    Post vital signs: Reviewed and stable  Level of consciousness: sedated  Complications: No apparent anesthesia complications

## 2015-10-15 NOTE — Consult Note (Signed)
Consultation  Referring Provider:     Dr. Corliss Skainssuei Primary Care Physician:  No PCP Per Patient Primary Gastroenterologist:    None/New     Reason for Consultation:          Impression / Plan:   Choledocholithiasis s/p laparoscopic cholecystectomy  Plan for ERCP, biliary sphincterotomy and stone extraction tomorrow The risks and benefits as well as alternatives of endoscopic procedure(s) have been discussed and reviewed. All questions answered. The patient agrees to proceed.         HPI:   Jessica Vasquez is a 24 y.o. female admitted with symptomatic cholelithiasis and cholecystitis intermittent post-prandial epigastric and RUQ pains x 4 years but intensified. She had 11 mm CBD on US. Had lap chole today and IOC shows 2 mobile filling defects c/w stones - I have viewed this. She is awake and oriented - thirsty and not in sig pain presently about 2 hrs post-op.   Past Medical History  Diagnosis Date  . Anemia   . Herpes     last outbreak two weeks ago ( mid march 2015)  . Hydradenitis     Past Surgical History  Procedure Laterality Date  . Nasal sugery    . Laparoscopic cholecystectomy w/ cholangiography      Family History  Problem Relation Age of Onset  . Gallstones Mother      Social History  Substance Use Topics  . Smoking status: Former Smoker    Quit date: 05/16/2011  . Smokeless tobacco: Never Used  . Alcohol Use: No          Current Facility-Administered Medications  Medication Dose Route Frequency Provider Last Rate Last Dose  . acetaminophen (TYLENOL) tablet 650 mg  650 mg Oral Q6H PRN Barnetta ChapelKelly Osborne, PA-C       Or  . acetaminophen (TYLENOL) suppository 650 mg  650 mg Rectal Q6H PRN Barnetta ChapelKelly Osborne, PA-C      . dextrose 5 % and 0.45 % NaCl with KCl 20 mEq/L infusion   Intravenous Continuous Barnetta ChapelKelly Osborne, PA-C 125 mL/hr at 10/15/15 0534    . diphenhydrAMINE (BENADRYL) capsule 25 mg  25 mg Oral Q6H PRN Barnetta ChapelKelly Osborne, PA-C       Or  . diphenhydrAMINE  (BENADRYL) injection 25 mg  25 mg Intravenous Q6H PRN Barnetta ChapelKelly Osborne, PA-C      . HYDROmorphone (DILAUDID) 1 MG/ML injection           . lactated ringers infusion   Intravenous Continuous Arta BruceKevin Ossey, MD 10 mL/hr at 10/15/15 0734    . morphine 2 MG/ML injection 1-4 mg  1-4 mg Intravenous Q2H PRN Barnetta ChapelKelly Osborne, PA-C   2 mg at 10/14/15 1825  . ondansetron (ZOFRAN-ODT) disintegrating tablet 4 mg  4 mg Oral Q6H PRN Barnetta ChapelKelly Osborne, PA-C       Or  . ondansetron [facility](ZOFRAN) injection 4 mg  4 mg Intravenous Q6H PRN Barnetta ChapelKelly Osborne, PA-C   4 mg at 10/15/15 0900  . oxyCODONE (Oxy IR/ROXICODONE) immediate release tablet 5-10 mg  5-10 mg Oral Q4H PRN Barnetta ChapelKelly Osborne, PA-C   10 mg at 10/14/15 1752  . simethicone (MYLICON) chewable tablet 40 mg  40 mg Oral Q6H PRN Barnetta ChapelKelly Osborne, PA-C        Allergies as of 10/14/2015  . (No Known Allergies)     Review of Systems:     All other review of systems are negative.       Physical Exam:  Vital signs  in last 24 hours: Temp:  [97.6 F (36.4 C)-98.7 F (37.1 C)] 98.1 F (36.7 C) (10/29 1035) Pulse Rate:  [55-83] 83 (10/29 1035) Resp:  [13-20] 16 (10/29 1035) BP: (93-119)/(43-63) 98/50 mmHg (10/29 1035) SpO2:  [99 %-100 %] 100 % (10/29 1035) Last BM Date: 10/13/15  General:  Well-developed, well-nourished and in no acute distress Eyes:  anicteric. ENT:   Mouth and posterior pharynx free of lesions.   Lungs: Clear to auscultation bilaterally. Heart:  S1S2, no rubs, murmurs, gallops. Abdomen:  soft,  Appropriately tender post-op,  Lymph:  no cervical or supraclavicular adenopathy. Extremities:   no edema Skin   no rash. + tattoos Neuro:  A&O x 3.  Psych:  appropriate mood and  Affect.   Data Reviewed:   LAB RESULTS:  Recent Labs  10/14/15 0536 10/15/15 0605  WBC 8.0 4.9  HGB 11.9* 11.6*  HCT 35.3* 35.9*  PLT 221 217   BMET  Recent Labs  10/14/15 0536 10/15/15 0605  NA 138 134*  K 4.0 4.2  CL 103 103  CO2 24 28  GLUCOSE 102* 90    BUN 7 <5*  CREATININE 0.75 0.80  CALCIUM 9.4 8.3*   LFT  Recent Labs  10/15/15 0605  PROT 6.0*  ALBUMIN 3.2*  AST 68*  ALT 82*  ALKPHOS 53  BILITOT 0.7     STUDIES: Dg Cholangiogram Operative  10/15/2015  CLINICAL DATA:  Lap chole done in OR 2; artifact is body jewelry that was difficult to remove from view EXAM: INTRAOPERATIVE CHOLANGIOGRAM TECHNIQUE: Cholangiographic images from the C-arm fluoroscopic device were submitted for interpretation post-operatively. Please see the procedural report for the amount of contrast and the fluoroscopy time utilized. COMPARISON:  Ultrasound 10/14/2015 FINDINGS: At least 2 mobile filling defects in the distal CBD just proximal to the ampulla. Contrast does flow on into the duodenum. Intrahepatic ducts are incompletely visualized, appearing mildly distended centrally. IMPRESSION: 1. Partially obstructive filling defects in the distal CBD suggesting choledocholithiasis. Electronically Signed   By: Corlis Leak M.D.   On: 10/15/2015 09:20   US Abdomen Limited  10/14/2015  CLINICAL DATA:  RIGHT upper quadrant pain beginning yesterday. History of gallstones. EXAM: US ABDOMEN LIMITED - RIGHT UPPER QUADRANT COMPARISON:  None. FINDINGS: Technologist reports limited examination due to inability to hold breath and, remain still due to abdominal pain. Gallbladder: Multiple echogenic layering gallstones with acoustic shadowing, largest measuring 12 mm. No gallbladder distention, wall thickening or pericholecystic fluid. Sonographic Murphy's sign elicited. Common bile duct: Diameter: Enlarged, 11 mm. Liver: No focal lesion identified. Within normal limits in parenchymal echogenicity. Hepatopetal portal vein. IMPRESSION: Cholelithiasis and sonographic Murphy sign concerning for acute cholecystitis. Dilated Common bile duct 11 mm, which could represent recently passed or, distal Common bile duct stone. Electronically Signed   By: Awilda Metro M.D.   On: 10/14/2015  06:13      Thanks   LOS: 1 day    Sena Slate, MD, [facility] @  10/15/2015, 10:56 AM

## 2015-10-15 NOTE — Transfer of Care (Signed)
Immediate Anesthesia Transfer of Care Note  Patient: Jessica Vasquez  Procedure(s) Performed: Procedure(s): LAPAROSCOPIC CHOLECYSTECTOMY WITH INTRAOPERATIVE CHOLANGIOGRAM (N/A)  Patient Location: PACU  Anesthesia Type:General  Level of Consciousness: awake, alert  and oriented  Airway & Oxygen Therapy: Patient Spontanous Breathing and Patient connected to nasal cannula oxygen  Post-op Assessment: Report given to RN, Post -op Vital signs reviewed and stable and Patient moving all extremities  Post vital signs: Reviewed and stable  Last Vitals:  Filed Vitals:   10/15/15 0536  BP: 93/54  Pulse: 70  Temp: 37 C  Resp: 19    Complications: No apparent anesthesia complications

## 2015-10-15 NOTE — Anesthesia Preprocedure Evaluation (Addendum)
Anesthesia Evaluation  Patient identified by MRN, date of birth, ID band Patient awake    Reviewed: Allergy & Precautions, NPO status , Patient's Chart, lab work & pertinent test results  Airway Mallampati: II  TM Distance: >3 FB Neck ROM: Full    Dental  (+) Teeth Intact   Pulmonary former smoker,    Pulmonary exam normal        Cardiovascular Normal cardiovascular exam     Neuro/Psych    GI/Hepatic   Endo/Other    Renal/GU      Musculoskeletal   Abdominal   Peds  Hematology   Anesthesia Other Findings   Reproductive/Obstetrics                            Anesthesia Physical Anesthesia Plan  ASA: II  Anesthesia Plan: General   Post-op Pain Management:    Induction: Intravenous  Airway Management Planned: Oral ETT  Additional Equipment:   Intra-op Plan:   Post-operative Plan: Extubation in OR  Informed Consent: I have reviewed the patients History and Physical, chart, labs and discussed the procedure including the risks, benefits and alternatives for the proposed anesthesia with the patient or authorized representative who has indicated his/her understanding and acceptance.   Dental advisory given  Plan Discussed with: CRNA and Surgeon  Anesthesia Plan Comments:        Anesthesia Quick Evaluation

## 2015-10-16 ENCOUNTER — Encounter (HOSPITAL_COMMUNITY): Admission: EM | Disposition: A | Discharge status: Home / Self Care | Payer: Self-pay | Source: Home / Self Care

## 2015-10-16 ENCOUNTER — Encounter (HOSPITAL_COMMUNITY): Payer: Self-pay | Admitting: Certified Registered"

## 2015-10-16 ENCOUNTER — Inpatient Hospital Stay (HOSPITAL_COMMUNITY): Payer: 59

## 2015-10-16 ENCOUNTER — Inpatient Hospital Stay (HOSPITAL_COMMUNITY): Payer: 59 | Admitting: Certified Registered"

## 2015-10-16 HISTORY — PX: ERCP: SHX5425

## 2015-10-16 LAB — COMPREHENSIVE METABOLIC PANEL
ALT: 144 U/L — AB (ref 14–54)
AST: 80 U/L — ABNORMAL HIGH (ref 15–41)
Albumin: 3 g/dL — ABNORMAL LOW (ref 3.5–5.0)
Alkaline Phosphatase: 57 U/L (ref 38–126)
Anion gap: 5 (ref 5–15)
CO2: 27 mmol/L (ref 22–32)
CREATININE: 0.68 mg/dL (ref 0.44–1.00)
Calcium: 8.2 mg/dL — ABNORMAL LOW (ref 8.9–10.3)
Chloride: 103 mmol/L (ref 101–111)
GFR calc non Af Amer: >60 mL/min (ref >60)
Glucose, Bld: 88 mg/dL (ref 65–99)
Potassium: 3.8 mmol/L (ref 3.5–5.1)
Sodium: 135 mmol/L (ref 135–145)
Total Bilirubin: 0.7 mg/dL (ref 0.3–1.2)
Total Protein: 5.8 g/dL — ABNORMAL LOW (ref 6.5–8.1)

## 2015-10-16 LAB — CBC
HEMATOCRIT: 34.3 % — AB (ref 36.0–46.0)
HEMOGLOBIN: 11.1 g/dL — AB (ref 12.0–15.0)
MCH: 28.4 pg (ref 26.0–34.0)
MCHC: 32.4 g/dL (ref 30.0–36.0)
MCV: 87.7 fL (ref 78.0–100.0)
Platelets: 202 10*3/uL (ref 150–400)
RBC: 3.91 MIL/uL (ref 3.87–5.11)
RDW: 13.3 % (ref 11.5–15.5)
WBC: 6.5 10*3/uL (ref 4.0–10.5)

## 2015-10-16 LAB — LIPASE, BLOOD: Lipase: 26 U/L (ref 11–51)

## 2015-10-16 LAB — PROTIME-INR
INR: 1.13 (ref 0.00–1.49)
Prothrombin Time: 14.7 seconds (ref 11.6–15.2)

## 2015-10-16 SURGERY — ERCP, WITH INTERVENTION IF INDICATED
Anesthesia: General

## 2015-10-16 MED ORDER — GLUCAGON HCL RDNA (DIAGNOSTIC) 1 MG IJ SOLR
INTRAMUSCULAR | Status: AC
  Filled 2015-10-16: qty 2

## 2015-10-16 MED ORDER — MIDAZOLAM HCL 2 MG/2ML IJ SOLN: 0.5000 mg | Freq: Once | INTRAMUSCULAR | Status: DC | PRN

## 2015-10-16 MED ORDER — PROMETHAZINE HCL 25 MG/ML IJ SOLN: 6.2500 mg | INTRAMUSCULAR | Status: DC | PRN

## 2015-10-16 MED ORDER — LACTATED RINGERS IV BOLUS (SEPSIS)
1000.0000 mL | Freq: Once | INTRAVENOUS | Status: AC
  Administered 2015-10-16: 1000 mL via INTRAVENOUS

## 2015-10-16 MED ORDER — ONDANSETRON HCL 4 MG/2ML IJ SOLN
INTRAMUSCULAR | Status: DC | PRN
  Administered 2015-10-16: 4 mg via INTRAVENOUS

## 2015-10-16 MED ORDER — HYDROMORPHONE HCL 1 MG/ML IJ SOLN: 0.2500 mg | INTRAMUSCULAR | Status: DC | PRN

## 2015-10-16 MED ORDER — IOHEXOL 300 MG/ML  SOLN
INTRAMUSCULAR | Status: DC | PRN
  Administered 2015-10-16: 20 mL

## 2015-10-16 MED ORDER — MEPERIDINE HCL 25 MG/ML IJ SOLN: 6.2500 mg | INTRAMUSCULAR | Status: DC | PRN

## 2015-10-16 MED ORDER — LIDOCAINE HCL (CARDIAC) 20 MG/ML IV SOLN
INTRAVENOUS | Status: DC | PRN
  Administered 2015-10-16: 20 mg via INTRAVENOUS

## 2015-10-16 MED ORDER — WHITE PETROLATUM GEL
Status: AC
  Filled 2015-10-16: qty 1

## 2015-10-16 MED ORDER — PROPOFOL 10 MG/ML IV BOLUS
INTRAVENOUS | Status: DC | PRN
  Administered 2015-10-16: 50 mg via INTRAVENOUS
  Administered 2015-10-16: 1 mg via INTRAVENOUS

## 2015-10-16 MED ORDER — FENTANYL CITRATE (PF) 250 MCG/5ML IJ SOLN
INTRAMUSCULAR | Status: DC | PRN
  Administered 2015-10-16: 50 ug via INTRAVENOUS
  Administered 2015-10-16: 100 ug via INTRAVENOUS

## 2015-10-16 MED ORDER — SUCCINYLCHOLINE CHLORIDE 20 MG/ML IJ SOLN
INTRAMUSCULAR | Status: DC | PRN
  Administered 2015-10-16: 100 mg via INTRAVENOUS

## 2015-10-16 NOTE — Anesthesia Preprocedure Evaluation (Addendum)
Anesthesia Evaluation  Patient identified by MRN, date of birth, ID band Patient awake    Reviewed: Allergy & Precautions, NPO status , Patient's Chart, lab work & pertinent test results  History of Anesthesia Complications Negative for: history of anesthetic complications  Airway Mallampati: II  TM Distance: >3 FB Neck ROM: Full    Dental  (+) Dental Advisory Given, Teeth Intact   Pulmonary neg pulmonary ROS, former smoker (quit 2012),    breath sounds clear to auscultation       Cardiovascular negative cardio ROS   Rhythm:Regular Rate:Normal     Neuro/Psych negative neurological ROS     GI/Hepatic Elevated LFTs with acute cholestasis S/p lap chole   Endo/Other  negative endocrine ROS  Renal/GU negative Renal ROS     Musculoskeletal   Abdominal (+) + obese,   Peds  Hematology negative hematology ROS (+)   Anesthesia Other Findings   Reproductive/Obstetrics 10/14/15 preg test: neg                            Anesthesia Physical Anesthesia Plan  ASA: II  Anesthesia Plan:    Post-op Pain Management:    Induction: Intravenous  Airway Management Planned: Oral ETT  Additional Equipment:   Intra-op Plan:   Post-operative Plan: Extubation in OR  Informed Consent: I have reviewed the patients History and Physical, chart, labs and discussed the procedure including the risks, benefits and alternatives for the proposed anesthesia with the patient or authorized representative who has indicated his/her understanding and acceptance.   Dental advisory given  Plan Discussed with: CRNA and Surgeon  Anesthesia Plan Comments: (Plan routine monitors, GETA)        Anesthesia Quick Evaluation

## 2015-10-16 NOTE — Anesthesia Procedure Notes (Signed)
Procedure Name: Intubation Date/Time: 10/16/2015 7:34 AM Performed by: Jerilee HohMUMM, Allea Kassner N Pre-anesthesia Checklist: Patient identified, Emergency Drugs available, Suction available and Patient being monitored Patient Re-evaluated:Patient Re-evaluated prior to inductionOxygen Delivery Method: Circle system utilized Preoxygenation: Pre-oxygenation with 100% oxygen Intubation Type: IV induction and Cricoid Pressure applied Ventilation: Mask ventilation without difficulty Laryngoscope Size: Mac and 3 Grade View: Grade I Tube type: Oral Tube size: 7.5 mm Number of attempts: 1 Airway Equipment and Method: Stylet Placement Confirmation: ETT inserted through vocal cords under direct vision,  positive ETCO2 and breath sounds checked- equal and bilateral Secured at: 22 cm Tube secured with: Tape Dental Injury: Teeth and Oropharynx as per pre-operative assessment

## 2015-10-16 NOTE — Transfer of Care (Signed)
Immediate Anesthesia Transfer of Care Note  Patient: Jessica Vasquez  Procedure(s) Performed: Procedure(s): ENDOSCOPIC RETROGRADE CHOLANGIOPANCREATOGRAPHY (ERCP) (N/A)  Patient Location: PACU  Anesthesia Type:General  Level of Consciousness: awake, alert , oriented and patient cooperative  Airway & Oxygen Therapy: Patient Spontanous Breathing and Patient connected to nasal cannula oxygen  Post-op Assessment: Report given to RN, Post -op Vital signs reviewed and stable and Patient moving all extremities  Post vital signs: Reviewed and stable  Last Vitals:  Filed Vitals:   10/16/15 0701  BP: 126/64  Pulse: 64  Temp:   Resp: 9    Complications: No apparent anesthesia complications

## 2015-10-16 NOTE — Progress Notes (Signed)
1 Day Post-Op  Subjective: Alert and stable.  No nausea or vomiting.  Minimal abdominal pain. Appreciate GI consultation by Dr. Leone PayorGessner yesterday. ERCP with sphincterotomy and stone extraction planned for today. Morning lab work pending.  Objective: Vital signs in last 24 hours: Temp:  [97.6 F (36.4 C)-98.7 F (37.1 C)] 97.7 F (36.5 C) (10/30 0505) Pulse Rate:  [55-83] 57 (10/30 0505) Resp:  [13-20] 18 (10/30 0505) BP: (98-119)/(45-63) 116/57 mmHg (10/30 0505) SpO2:  [100 %] 100 % (10/30 0505) Last BM Date: 10/13/15  Intake/Output from previous day: 10/29 0701 - 10/30 0700 In: 2022.1 [P.O.:720; I.V.:1302.1] Out: 25 [Blood:25] Intake/Output this shift: Total I/O In: 480 [P.O.:480] Out: -   General appearance: Alert.  No distress.  Appropriate. GI: Soft.  Nontender.  Nondistended.  Trocar sites look good.  Lab Results:  No results found for this or any previous visit (from the past 24 hour(s)).   Studies/Results: Dg Cholangiogram Operative  10/15/2015  CLINICAL DATA:  Lap chole done in OR 2; artifact is body jewelry that was difficult to remove from view EXAM: INTRAOPERATIVE CHOLANGIOGRAM TECHNIQUE: Cholangiographic images from the C-arm fluoroscopic device were submitted for interpretation post-operatively. Please see the procedural report for the amount of contrast and the fluoroscopy time utilized. COMPARISON:  Ultrasound 10/14/2015 FINDINGS: At least 2 mobile filling defects in the distal CBD just proximal to the ampulla. Contrast does flow on into the duodenum. Intrahepatic ducts are incompletely visualized, appearing mildly distended centrally. IMPRESSION: 1. Partially obstructive filling defects in the distal CBD suggesting choledocholithiasis. Electronically Signed   By: Corlis Leak  Hassell M.D.   On: 10/15/2015 09:20    . ampicillin-sulbactam (UNASYN) IV  1.5 g Intravenous Once  . indomethacin  100 mg Rectal Once  . white petrolatum         Assessment/Plan: s/p  Procedure(s): LAPAROSCOPIC CHOLECYSTECTOMY WITH INTRAOPERATIVE CHOLANGIOGRAM  POD #1.  Laparoscopic cholecystectomy with cholangiogram. Stable No apparent surgical complications  Choledocholithiasis Nothing by mouth.  Check labs.  ERCP today Hopefully home tomorrow.  @PROBHOSP @  LOS: 2 days    Jessica Vasquez M 10/16/2015  . .prob

## 2015-10-16 NOTE — Anesthesia Postprocedure Evaluation (Signed)
  Anesthesia Post-op Note  Patient: Jessica Vasquez  Procedure(s) Performed: Procedure(s): ENDOSCOPIC RETROGRADE CHOLANGIOPANCREATOGRAPHY (ERCP) (N/A)  Patient Location: PACU  Anesthesia Type:General  Level of Consciousness: sedated, patient cooperative and responds to stimulation and voice  Airway and Oxygen Therapy: Patient Spontanous Breathing  Post-op Pain: none  Post-op Assessment: Post-op Vital signs reviewed, Patient's Cardiovascular Status Stable, Respiratory Function Stable, Patent Airway, No signs of Nausea or vomiting and Pain level controlled              Post-op Vital Signs: Reviewed and stable  Last Vitals:  Filed Vitals:   10/16/15 0900  BP: 126/75  Pulse: 78  Temp: 36.6 C  Resp: 14    Complications: No apparent anesthesia complications

## 2015-10-16 NOTE — Op Note (Signed)
[facility] [address] [place], [id number]   ERCP PROCEDURE REPORT        EXAM DATE: 10/16/2015  PATIENT NAME:          Jessica Vasquez, Jessica Vasquez          MR #: [id number] BIRTHDATE:       Mar 04, 1991     VISIT #:     [id number] ATTENDING:     Iva Booparl E Gessner, MD, Clementeen GrahamFACG     STATUS:     outpatient  ASSISTANT:      Kandice RobinsonsAwaka, Guillaume and Waunita SchoonerHoneycutt, Elizabeth   INDICATIONS:  The patient is a 24 yr old female here for an ERCP due to PROCEDURE PERFORMED:     ERCP with sphincterotomy/papillotomy ERCP with removal of calculus/calculi MEDICATIONS:     Per Anesthesia     IV Unasyn on call  CONSENT: The patient understands the risks and benefits of the procedure and understands that these risks include, but are not limited to: sedation, allergic reaction, infection, perforation and/or bleeding. Alternative means of evaluation and treatment include, among others: physical exam, x-rays, and/or surgical intervention. The patient elects to proceed with this endoscopic procedure.  DESCRIPTION OF PROCEDURE: During intra-op preparation period all mechanical & medical equipment was checked for proper function. Hand hygiene and appropriate measures for infection prevention was taken. After the risks, benefits and alternatives of the procedure were thoroughly explained, Informed was verified, confirmed and timeout was successfully executed by the treatment team. With the patient in left semi-prone position, medications were administered intravenously.The Pentax ERCP [id number] was passed from the mouth into the esophagus and further advanced from the esophagus into the stomach. From stomach scope was directed to the second portion of the duodenum.  Major papilla was aligned with the duodenoscope. The scope position was confirmed fluoroscopically. Rest of the findings/therapeutics are given below. The scope was then completely withdrawn from the patient and the procedure  completed. The pulse, BP, and O2 saturation were monitored and documented by the anesthesia staff throughout the entire procedure. The patient was cared for as planned according to standard protocol. The patient was then discharged to recovery in stable condition and with appropriate post procedure care. Estimated blood loss is zero unless otherwise noted in this procedure report.  1) Normal stomach and D1/D2 duodenum 2) Prominent major papilla, bile exiting 3) Free deep cannulation of CBD - contrast injection showed 10-11 mm CBD/CHDsome mild intrahepatic dilation, absent gallbladder and 1 mobile stone ? 2.   4) Wire in duct and biliary sphincterotomy (large) performed without problems.   5) Balloon sweeps removed 1 stone for sure - occlusion cholangiograms negative for any remaining stones. 6) Post-procedure indomethacin given and will get 1 L LR bolus to reduce chances of pancreatitis - no pancreatic duct entry or injection by intent.    ADVERSE EVENT:     There were no complications. IMPRESSIONS:     1) 10-11 mm CBD/CHD and some mild intrahepatic dilation, absent gallbladder and 1 mobile stone ? 2. 2) biliary sphincterotomy (large) performed without problems. 3) Balloon sweeps removed 1 stone for sure - occlusion cholangiograms negative for any remaining stones. 4) Post-procedure indomethacin given and will get 1 L LR bolus to reduce chances of pancreatitis - no pancreatic duct entry or injection by intent  RECOMMENDATIONS:     1.  Clear liquids today 2.  If ok in AM - low fat diet and home 3.  GI f/u prn  Iva Boop, MD, Clementeen Graham eSigned:  Iva Boop, MD, [facility] 10/16/2015 8:31 AM revis     PATIENT NAME:  Jessica Vasquez, Jessica Vasquez MR#: [id number]

## 2015-10-17 ENCOUNTER — Encounter (HOSPITAL_COMMUNITY): Payer: Self-pay | Admitting: Internal Medicine

## 2015-10-17 LAB — COMPREHENSIVE METABOLIC PANEL
ALK PHOS: 48 U/L (ref 38–126)
ALT: 99 U/L — AB (ref 14–54)
AST: 40 U/L (ref 15–41)
Albumin: 3.1 g/dL — ABNORMAL LOW (ref 3.5–5.0)
Anion gap: 7 (ref 5–15)
BILIRUBIN TOTAL: 0.7 mg/dL (ref 0.3–1.2)
CALCIUM: 8.5 mg/dL — AB (ref 8.9–10.3)
CHLORIDE: 107 mmol/L (ref 101–111)
CO2: 25 mmol/L (ref 22–32)
CREATININE: 0.61 mg/dL (ref 0.44–1.00)
Glucose, Bld: 86 mg/dL (ref 65–99)
Potassium: 3.8 mmol/L (ref 3.5–5.1)
Sodium: 139 mmol/L (ref 135–145)
Total Protein: 6 g/dL — ABNORMAL LOW (ref 6.5–8.1)

## 2015-10-17 LAB — CBC
HEMATOCRIT: 33.4 % — AB (ref 36.0–46.0)
HEMOGLOBIN: 11.1 g/dL — AB (ref 12.0–15.0)
MCH: 28.9 pg (ref 26.0–34.0)
MCHC: 33.2 g/dL (ref 30.0–36.0)
MCV: 87 fL (ref 78.0–100.0)
PLATELETS: 186 10*3/uL (ref 150–400)
RBC: 3.84 MIL/uL — ABNORMAL LOW (ref 3.87–5.11)
RDW: 13.1 % (ref 11.5–15.5)
WBC: 5.5 10*3/uL (ref 4.0–10.5)

## 2015-10-17 LAB — LIPASE, BLOOD: Lipase: 29 U/L (ref 11–51)

## 2015-10-17 LAB — AMYLASE: Amylase: 53 U/L (ref 28–100)

## 2015-10-17 MED ORDER — OXYCODONE HCL 5 MG PO TABS: 5.0000 mg | ORAL_TABLET | ORAL | Status: DC | PRN

## 2015-10-17 MED ORDER — POLYETHYLENE GLYCOL 3350 17 G PO PACK
17.0000 g | PACK | Freq: Every day | ORAL | Status: DC
  Administered 2015-10-17: 17 g via ORAL
  Filled 2015-10-17: qty 1

## 2015-10-17 NOTE — Progress Notes (Signed)
Jessica Vasquez discharged Home  per MD order.  Discharge instructions reviewed and discussed with the patient, all questions and concerns answered. Copy of instructions, care notes for new medications & diagnosis and scripts given to patient.    Medication List    TAKE these medications        oxyCODONE 5 MG immediate release tablet  Commonly known as:  Oxy IR/ROXICODONE  Take 1-2 tablets (5-10 mg total) by mouth every 4 (four) hours as needed for moderate pain.        Patients skin is clean, dry and intact, no evidence of skin break down. IV site discontinued and catheter remains intact. Site without signs and symptoms of complications. Dressing and pressure applied.  Patient escorted to car ambulating with NT,  no distress noted upon discharge.  Jessica Vasquez, Jessica Vasquez 10/17/2015 1:58 PM

## 2015-10-17 NOTE — Discharge Instructions (Signed)
[facility] ______[facility] °LAPAROSCOPIC SURGERY: POST OP INSTRUCTIONS °Always review your discharge instruction sheet given to you by the facility where your surgery was performed. °IF YOU HAVE DISABILITY OR FAMILY LEAVE FORMS, YOU MUST BRING THEM TO THE OFFICE FOR PROCESSING.   °DO NOT GIVE THEM TO YOUR DOCTOR. ° °1. A prescription for pain medication may be given to you upon discharge.  Take your pain medication as prescribed, if needed.  If narcotic pain medicine is not needed, then you may take acetaminophen (Tylenol) or ibuprofen (Advil) as needed. °2. Take your usually prescribed medications unless otherwise directed. °3. If you need a refill on your pain medication, please contact your pharmacy.  They will contact our office to request authorization. Prescriptions will not be filled after 5pm or on week-ends. °4. You should follow a light diet the first few days after arrival home, such as soup and crackers, etc.  Be sure to include lots of fluids daily. °5. Most patients will experience some swelling and bruising in the area of the incisions.  Ice packs will help.  Swelling and bruising can take several days to resolve.  °6. It is common to experience some constipation if taking pain medication after surgery.  Increasing fluid intake and taking a stool softener (such as Colace) will usually help or prevent this problem from occurring.  A mild laxative (Milk of Magnesia or Miralax) should be taken according to package instructions if there are no bowel movements after 48 hours. °7. Unless discharge instructions indicate otherwise, you may remove your bandages 24-48 hours after surgery, and you may shower at that time.  You may have steri-strips (small skin tapes) in place directly over the incision.  These strips should be left on the skin for 7-10 days.  If your surgeon used skin glue on the incision, you may shower in 24 hours.  The glue will flake off over the next 2-3 weeks.  Any sutures or  staples will be removed at the office during your follow-up visit. °8. ACTIVITIES:  You may resume regular (light) daily activities beginning the next day--such as daily self-care, walking, climbing stairs--gradually increasing activities as tolerated.  You may have sexual intercourse when it is comfortable.  Refrain from any heavy lifting or straining until approved by your doctor. °a. You may drive when you are no longer taking prescription pain medication, you can comfortably wear a seatbelt, and you can safely maneuver your car and apply brakes. °b. RETURN TO WORK:  __________________________________________________________ °9. You should see your doctor in the office for a follow-up appointment approximately 2-3 weeks after your surgery.  Make sure that you call for this appointment within a day or two after you arrive home to insure a convenient appointment time. °10. OTHER INSTRUCTIONS: __________________________________________________________________________________________________________________________ __________________________________________________________________________________________________________________________ °WHEN TO CALL YOUR DOCTOR: °1. Fever over 101.0 °2. Inability to urinate °3. Continued bleeding from incision. °4. Increased pain, redness, or drainage from the incision. °5. Increasing abdominal pain ° °The clinic staff is available to answer your questions during regular business hours.  Please don’t hesitate to call and ask to speak to one of the nurses for clinical concerns.  If you have a medical emergency, go to the nearest emergency room or call 911.  A surgeon from [facility] is always on call at the hospital. °[address], [address], [place], [place]  [address] ? [address] [address], [place], [place]   [address] °[phone] ? [phone] ? FAX [phone] °Web site:   [email or web address] °

## 2015-10-17 NOTE — Progress Notes (Signed)
Pt in camera room. No seizure like activity noted or observed between 2300-0638. None reported by family or patient.

## 2015-10-17 NOTE — Discharge Summary (Signed)
Patient ID: Andris Flurryerri S Baine MRN: [id number] DOB/AGE: 08-24-1991 24 y.o.  Admit date: 10/14/2015 Discharge date: 10/17/2015  Procedures: lap chole with + IOC S/p ERCP with stone extraction  Consults: GI  Reason for Admission: This is a 24 yo black female who has had intermittent abdominal pains for the last 3 years since having her daughter. She was diagnosed with gallstones, but has never sought treatment. She generally gets some pain after eating, but usually goes away. Yesterday morning she started having pain again with nausea and vomiting. It has continued to persist. She states she had a fever at home, but none since then. Her pain is worse after eating. [facility] Her labs are all normal. Her US shows gallstones, with no evidence of cholecystitis, but her CBD is 11mm, but no definite CBD stone seen. We have been asked to see her for admission.  Admission Diagnoses:  1. Biliary colic  Hospital Course: The patient was admitted and taken to the OR the following day.  She underwent a lap chole and was found to have a + IOC.  GI was consulted and an ERCP was completed the following day.  She tolerated both procedures well.  On POD 2, she was tolerating a solid diet and was taking oral pain medications.  She was felt stable for dc home.  PE: Abd: soft, appropriately tender, +BS, mild bloating, incisions are c/d/i with gauze and tegaderm present  Discharge Diagnoses:  Active Problems:   Cholelithiasis   Choledocholithiasis s/p lap chole S/p ERCP  Discharge Medications:   Medication List    TAKE these medications        oxyCODONE 5 MG immediate release tablet  Commonly known as:  Oxy IR/ROXICODONE  Take 1-2 tablets (5-10 mg total) by mouth every 4 (four) hours as needed for moderate pain.        Discharge Instructions:     Follow-up Information    [facility].   Why:  11:30 for  hospital follow up   Contact information:   [address] [place] [id number] [phone]      Follow up with Lenard ForthLIEPINS, ANDY, PA-C On 11/02/2015.   Specialty:  Surgery   Why:  [facility], 10:00am, arrive no later than 9:30am for paperwork   Contact information:   [address] [place] [address] [phone]       Signed: Letha CapeOSBORNE,Marithza Malachi E 10/17/2015, 10:51 AM

## 2015-10-17 NOTE — Care Management Note (Signed)
Case Management Note  Patient Details  Name: Andris Flurryerri S Mcbrien MRN: [id number] Date of Birth: 15-Sep-1991  Subjective/Objective:       Patient is for dc today, no needs.             Action/Plan:   Expected Discharge Date:                  Expected Discharge Plan:  Home/Self Care  In-House Referral:     Discharge planning Services  CM Consult  Post Acute Care Choice:    Choice offered to:     DME Arranged:    DME Agency:     HH Arranged:    HH Agency:     Status of Service:  Completed, signed off  Medicare Important Message Given:    Date Medicare IM Given:    Medicare IM give by:    Date Additional Medicare IM Given:    Additional Medicare Important Message give by:     If discussed at Long Length of Stay Meetings, dates discussed:    Additional Comments:  Leone Havenaylor, Nature Kueker Clinton, RN 10/17/2015, 12:21 PM

## 2015-10-21 ENCOUNTER — Ambulatory Visit: Payer: 59 | Attending: Family Medicine | Admitting: Physician Assistant

## 2015-10-21 ENCOUNTER — Encounter: Payer: Self-pay | Admitting: Physician Assistant

## 2015-10-21 VITALS — BP 111/76 | HR 69 | Temp 98.6°F | Resp 18 | Ht 70.0 in | Wt 208.6 lb

## 2015-10-21 DIAGNOSIS — K8020 Calculus of gallbladder without cholecystitis without obstruction: Secondary | ICD-10-CM | POA: Diagnosis present

## 2015-10-21 DIAGNOSIS — Z9049 Acquired absence of other specified parts of digestive tract: Secondary | ICD-10-CM | POA: Insufficient documentation

## 2015-10-21 DIAGNOSIS — Z79899 Other long term (current) drug therapy: Secondary | ICD-10-CM | POA: Diagnosis not present

## 2015-10-21 DIAGNOSIS — Z09 Encounter for follow-up examination after completed treatment for conditions other than malignant neoplasm: Secondary | ICD-10-CM | POA: Diagnosis not present

## 2015-10-21 DIAGNOSIS — K8070 Calculus of gallbladder and bile duct without cholecystitis without obstruction: Secondary | ICD-10-CM | POA: Diagnosis not present

## 2015-10-21 DIAGNOSIS — K8050 Calculus of bile duct without cholangitis or cholecystitis without obstruction: Secondary | ICD-10-CM | POA: Diagnosis present

## 2015-10-21 MED ORDER — PROMETHAZINE HCL 25 MG PO TABS: 25.0000 mg | ORAL_TABLET | Freq: Three times a day (TID) | ORAL | Status: DC | PRN

## 2015-10-21 MED ORDER — OXYCODONE HCL 5 MG PO TABS: 5.0000 mg | ORAL_TABLET | Freq: Four times a day (QID) | ORAL | Status: DC | PRN

## 2015-10-21 NOTE — Progress Notes (Signed)
Patient here for F/U for Gall Stones.  Patient complains of pain stomach pain scaled at a 7, described as aching and tenderness. Patient experiences nausea with HA's as well. Pain has been present since released from hospital.

## 2015-10-21 NOTE — Progress Notes (Signed)
Jessica Vasquez  ZOX:[id number]  WJX:[id number]  DOB - 04/08/1991  Chief Complaint  Patient presents with  . Hospitalization Follow-up       Subjective:   Jessica Vasquez is a 24 y.o. female here today for establishment of care.  She was hospitalized 1028 through 10/17/2015. At that time she presented with intermittent abdominal pain with a previous history of gallstones without treatment. She described a vague pain after meals but had worsened over the last 2-3 days. She also been suffering some nausea and vomiting and fevers. Her labs were normal. An ultrasound of the gallbladder revealed gallstones without cholecystitis. Her common bile duct was dilated at 11 mm.  She was admitted with biliary colic.  She was seen by general surgery. She underwent left scopic cholecystectomy followed by gastroenterology consultation for ERCP of the stones.  Her post surgery and ERCP course was uncomplicated.  She was sent home with pain medications and told to follow-up here. She also has a follow-up with general surgeon later this month. Since discharge she's not had any fevers. She has less pain but still has crampy pain occasionally. Her nausea persists.  She is also having intermittent headaches.  ROS: GEN: denies fever or chills, denies change in weight HEENT:  positive headache,  No earache, epistaxis, sore throat, or neck pain LUNGS: denies SHOB, dyspnea, PND, orthopnea ABD:  Positive abdominal pain and nausea  ALLERGIES: No Known Allergies  PAST MEDICAL HISTORY: Past Medical History  Diagnosis Date  . Anemia   . Herpes     last outbreak two weeks ago ( mid march 2015)  . Hydradenitis   . Cholelithiasis     PAST SURGICAL HISTORY: Past Surgical History  Procedure Laterality Date  . Nasal sugery    . Laparoscopic cholecystectomy w/ cholangiography    . Ercp N/A 10/16/2015    Procedure: ENDOSCOPIC RETROGRADE CHOLANGIOPANCREATOGRAPHY (ERCP);  Surgeon: Iva Boop, MD;  [facility]  ENDOSCOPY;  Service: Endoscopy;  Laterality: N/A;  . Cholecystectomy N/A 10/15/2015    Procedure: LAPAROSCOPIC CHOLECYSTECTOMY WITH INTRAOPERATIVE CHOLANGIOGRAM;  Surgeon: Manus Rudd, MD;  Location: [facility] OR;  Service: General;  Laterality: N/A;    MEDICATIONS AT HOME: Prior to Admission medications   Medication Sig Start Date End Date Taking? Authorizing Provider  oxyCODONE (OXY IR/ROXICODONE) 5 MG immediate release tablet Take 1-2 tablets (5-10 mg total) by mouth every 6 (six) hours as needed for moderate pain. 10/21/15  Yes Benjamin Merrihew Netta Cedars, PA-C  promethazine (PHENERGAN) 25 MG tablet Take 1 tablet (25 mg total) by mouth every 8 (eight) hours as needed for nausea or vomiting. 10/21/15   Vivianne Master, PA-C     Objective:   Filed Vitals:   10/21/15 1200  BP: 111/76  Pulse: 69  Temp: 98.6 F (37 C)  TempSrc: Oral  Resp: 18  Height:  (1.778 m)  Weight: 208 lb 9.6 oz (94.62 kg)  SpO2: 100%    Exam General appearance : Awake, alert, not in any distress. Speech Clear. Not toxic looking Chest:Good air entry bilaterally, no added sounds  Abdomen: Bowel sounds present,  Diffusely tender but more so in the periumbilical area tender and not distended with no gaurding, rigidity or rebound. Skin:No Rash Wounds: clean and dry; Steri-Strips intact   Assessment & Plan   Cholelithiasis/choledocholithiasis status post laparoscopic cholecystectomy and ERCP  - continue pain medications  - Phenergan as needed for nausea  - keep appointment with the general surgeon later this month (11/02/2015 at 10  AM)  - call us if he develops a fever  Return in about 6 weeks (around 12/02/2015).  The patient was given clear instructions to go to ER or return to medical center if symptoms don't improve, worsen or new problems develop. The patient verbalized understanding. The patient was told to call to get lab results if they haven't heard anything in the next week.   This note has been created with  Education officer, environmentalDragon speech recognition software and smart phrase technology. Any transcriptional errors are unintentional.    Scot Juniffany Avana Kreiser, PA-C [facility] [place] [phone]   10/21/2015, 12:47 PM

## 2015-10-21 NOTE — Patient Instructions (Signed)
Keep appt on 11/16 with the general surgeon We will see you back here in approx 6 weeks Keep incisions clean and dry

## 2015-11-03 ENCOUNTER — Encounter (HOSPITAL_COMMUNITY): Payer: Self-pay | Admitting: *Deleted

## 2015-11-03 ENCOUNTER — Emergency Department (HOSPITAL_COMMUNITY)
Admission: EM | Admit: 2015-11-03 | Discharge: 2015-11-03 | Disposition: A | Discharge status: Home / Self Care | Payer: 59 | Attending: Emergency Medicine | Admitting: Emergency Medicine

## 2015-11-03 DIAGNOSIS — L02411 Cutaneous abscess of right axilla: Secondary | ICD-10-CM | POA: Diagnosis not present

## 2015-11-03 DIAGNOSIS — Z87891 Personal history of nicotine dependence: Secondary | ICD-10-CM | POA: Diagnosis not present

## 2015-11-03 DIAGNOSIS — L732 Hidradenitis suppurativa: Secondary | ICD-10-CM | POA: Insufficient documentation

## 2015-11-03 DIAGNOSIS — Z8619 Personal history of other infectious and parasitic diseases: Secondary | ICD-10-CM | POA: Diagnosis not present

## 2015-11-03 DIAGNOSIS — L0231 Cutaneous abscess of buttock: Secondary | ICD-10-CM | POA: Diagnosis not present

## 2015-11-03 DIAGNOSIS — Z862 Personal history of diseases of the blood and blood-forming organs and certain disorders involving the immune mechanism: Secondary | ICD-10-CM | POA: Diagnosis not present

## 2015-11-03 DIAGNOSIS — L02412 Cutaneous abscess of left axilla: Secondary | ICD-10-CM | POA: Diagnosis present

## 2015-11-03 MED ORDER — CHLORHEXIDINE GLUCONATE 4 % EX LIQD: Freq: Every day | CUTANEOUS | Status: DC | PRN

## 2015-11-03 NOTE — ED Provider Notes (Signed)
CSN: [id number]     Arrival date & time 11/03/15  0944 History  By signing my name below, I, Emmanuella Mensah, attest that this documentation has been prepared under the direction and in the presence of Ritchie Klee, PA-C. Electronically Signed: Judithann Sauger, ED Scribe. 11/03/2015. 10:57 AM.     Chief Complaint  Patient presents with  . Abscess   The history is provided by the patient. No language interpreter was used.   HPI Comments: Jessica Vasquez is a 24 y.o. female with a hx of hydradenitis who presents to the Emergency Department complaining of multiple draining abscesses to the bilateral axilla, groin and buttocks onset several days ago. Her abscesses today are small without overlying redness of the skin. She denies significant pain, states they are just uncomfortable. She reports a long history of frequent abscesses in these areas. She states that most of them spontaneously drain on their own but sometimes they require I&D. She has known hydradenitis but has not followed up with a general surgeon. She presents today requesting antibiotics to prevent her abscesses. She states that her abscesses today are typical of her frequent abscesses. She denies any fever, chills, abdominal pain, or n/v/d. She states that she uses hot compresses at home to treat her abscesses.   Past Medical History  Diagnosis Date  . Anemia   . Herpes     last outbreak two weeks ago ( mid march 2015)  . Hydradenitis   . Cholelithiasis    Past Surgical History  Procedure Laterality Date  . Nasal sugery    . Laparoscopic cholecystectomy w/ cholangiography    . Ercp N/A 10/16/2015    Procedure: ENDOSCOPIC RETROGRADE CHOLANGIOPANCREATOGRAPHY (ERCP);  Surgeon: Gatha Mayer, MD;  Location: [facility] ENDOSCOPY;  Service: Endoscopy;  Laterality: N/A;  . Cholecystectomy N/A 10/15/2015    Procedure: LAPAROSCOPIC CHOLECYSTECTOMY WITH INTRAOPERATIVE CHOLANGIOGRAM;  Surgeon: Donnie Mesa, MD;  Location: [facility] OR;  Service:  General;  Laterality: N/A;   Family History  Problem Relation Age of Onset  . Gallstones Mother    Social History  Substance Use Topics  . Smoking status: Former Smoker    Quit date: 05/16/2011  . Smokeless tobacco: Never Used  . Alcohol Use: No   OB History    Gravida Para Term Preterm AB TAB SAB Ectopic Multiple Living   2 2 2  0 0 0 0 0 0 2     Review of Systems  Constitutional: Negative for fever and chills.  Gastrointestinal: Negative for nausea, vomiting, abdominal pain and diarrhea.  Skin: Positive for wound (abscesses).  All other systems reviewed and are negative.     Allergies  Review of patient's allergies indicates no known allergies.  Home Medications   Prior to Admission medications   Medication Sig Start Date End Date Taking? Authorizing Provider  chlorhexidine (HIBICLENS) 4 % external liquid Apply topically daily as needed. 11/03/15   Orenthal Debski, PA-C  oxyCODONE (OXY IR/ROXICODONE) 5 MG immediate release tablet Take 1-2 tablets (5-10 mg total) by mouth every 6 (six) hours as needed for moderate pain. 10/21/15   Tiffany Daneil Dan, PA-C  promethazine (PHENERGAN) 25 MG tablet Take 1 tablet (25 mg total) by mouth every 8 (eight) hours as needed for nausea or vomiting. 10/21/15   Tiffany Daneil Dan, PA-C   BP 118/73 mmHg  Pulse 84  Temp(Src) 98.2 F (36.8 C) (Oral)  Resp 20  Ht 5' 2"  (1.575 m)  Wt 209 lb 4 oz (94.915 kg)  BMI 38.26  kg/m2  SpO2 100%  LMP 10/19/2015 Physical Exam  Constitutional: She appears well-developed and well-nourished. No distress.  Non-toxic appearing  HENT:  Head: Normocephalic and atraumatic.  Right Ear: External ear normal.  Left Ear: External ear normal.  Eyes: Conjunctivae are normal. Right eye exhibits no discharge. Left eye exhibits no discharge. No scleral icterus.  Neck: Normal range of motion.  Cardiovascular: Normal rate.   Pulmonary/Chest: Effort normal.  Abdominal: Soft. She exhibits no distension. There is no  tenderness.  Musculoskeletal: Normal range of motion.  Moves all extremities spontaneously  Neurological: She is alert. Coordination normal.  Skin: Skin is warm and dry. Lesion noted. No rash noted. No erythema.     Multiple healed abscess under bilateral axilla, groin, and buttocks. Few areas of induration that are small (less than 0.5 cm). No tenderness over the axilla, groin or buttocks. No fluctuance, no erythema. No abscesses amenable to I&D.   Psychiatric: She has a normal mood and affect. Her behavior is normal.  Nursing note and vitals reviewed.   ED Course  Procedures (including critical care time) DIAGNOSTIC STUDIES: Oxygen Saturation is 100% on RA, normal by my interpretation.    COORDINATION OF CARE: 10:33 AM- Pt advised of plan for treatment and pt agrees. Explained that pt will not receive antibiotics today. She will receive Hibiclens pink soap for the recurrent abscesses and advised to continue the warm compresses. Will receive a referral to general surgeon for a possible gland removal to stop the recurrent abscesses.    Labs Review Labs Reviewed - No data to display  Imaging Review No results found.   Lahoma Crocker Elissia Spiewak, PA-C has personally reviewed and evaluated these images and lab results as part of her medical decision-making.   EKG Interpretation None      MDM   Final diagnoses:  Hidradenitis suppurativa   Patient presenting with multiple abscesses to axilla, groin and buttocks. Known diagnosis of hidradenitis suppurativa and she has not met with a general surgeon. Uses hot compresses at home for symptom relief. No s&s of infection. VSS. Multiple healed abscesses to axilla, groin and buttocks. Few areas of induration without fluctuance. No erythema or signs of infection. Areas not amenable to I&D. Will give patient hibiclens and referral to general surgeon. Discussed good hygiene with patient. Return precautions given in discharge paperwork and discussed with pt  at bedside. Pt stable for discharge   I personally performed the services described in this documentation, which was scribed in my presence. The recorded information has been reviewed and is accurate.   Josephina Gip, PA-C 11/03/15 [place], MD 11/04/15 [phone]

## 2015-11-03 NOTE — ED Notes (Signed)
Pt in c/o multiple abscess to bilateral axilla and buttocks, reports some drainage, no fever

## 2015-11-03 NOTE — Discharge Instructions (Signed)
Call to schedule a follow up appointment with Dr. Janee Mornhompson (general surgery). Use the hibiclens wash for frequent abscesses. Continue using hot compresses for new abscesses. Return to ED with new, worsening or concerning symptoms   Hidradenitis Suppurativa Hidradenitis suppurativa is a long-term (chronic) skin disease that starts with blocked sweat glands or hair follicles. Bacteria may grow in these blocked openings of your skin. Hidradenitis suppurativa is like a severe form of acne that develops in areas of your body where acne would be unusual. It is most likely to affect the areas of your body where skin rubs against skin and becomes moist. This includes your:  Underarms.  Groin.  Genital areas.  Buttocks.  Upper thighs.  Breasts. Hidradenitis suppurativa may start out with small pimples. The pimples can develop into deep sores that break open (rupture) and drain pus. Over time your skin may thicken and become scarred. Hidradenitis suppurativa cannot be passed from person to person.  CAUSES  The exact cause of hidradenitis suppurativa is not known. This condition may be due to:  Female and female hormones. The condition is rare before and after puberty.  An overactive body defense system (immune system). Your immune system may overreact to the blocked hair follicles or sweat glands and cause swelling and pus-filled sores. RISK FACTORS You may have a higher risk of hidradenitis suppurativa if you:  Are a woman.  Are between ages 9611 and 4655.  Have a family history of hidradenitis suppurativa.  Have a personal history of acne.  Are overweight.  Smoke.  Take the drug lithium. SIGNS AND SYMPTOMS  The first signs of an outbreak are usually painful skin bumps that look like pimples. As the condition progresses:  Skin bumps may get bigger and grow deeper into the skin.  Bumps under the skin may rupture and drain smelly pus.  Skin may become itchy and infected.  Skin may  thicken and scar.  Drainage may continue through tunnels under the skin (fistulas).  Walking and moving your arms can become painful. DIAGNOSIS  Your health care provider may diagnose hidradenitis suppurativa based on your medical history and your signs and symptoms. A physical exam will also be done. You may need to see a health care provider who specializes in skin diseases (dermatologist). You may also have tests done to confirm the diagnosis. These can include:  Swabbing a sample of pus or drainage from your skin so it can be sent to the lab and tested for infection.  Blood tests to check for infection. TREATMENT  The same treatment will not work for everybody with hidradenitis suppurativa. Your treatment will depend on how severe your symptoms are. You may need to try several treatments to find what works best for you. Part of your treatment may include cleaning and bandaging (dressing) your wounds. You may also have to take medicines, such as the following:  Antibiotics.  Acne medicines.  Medicines to block or suppress the immune system.  A diabetes medicine (metformin) is sometimes used to treat this condition.  For women, birth control pills can sometimes help relieve symptoms. You may need surgery if you have a severe case of hidradenitis suppurativa that does not respond to medicine. Surgery may involve:   Using a laser to clear the skin and remove hair follicles.  Opening and draining deep sores.  Removing the areas of skin that are diseased and scarred. HOME CARE INSTRUCTIONS  Learn as much as you can about your disease, and work closely with  your health care providers.  Take medicines only as directed by your health care provider.  If you were prescribed an antibiotic medicine, finish it all even if you start to feel better.  If you are overweight, losing weight may be very helpful. Try to reach and maintain a healthy weight.  Do not use any tobacco products,  including cigarettes, chewing tobacco, or electronic cigarettes. If you need help quitting, ask your health care provider.  Do not shave the areas where you get hidradenitis suppurativa.  Do not wear deodorant.  Wear loose-fitting clothes.  Try not to overheat and get sweaty.  Take a daily bleach bath as directed by your health care provider.  Fill your bathtub halfway with water.  Pour in  cup of unscented household bleach.  Soak for 5-10 minutes.  Cover sore areas with a warm, clean washcloth (compress) for 5-10 minutes. SEEK MEDICAL CARE IF:   You have a flare-up of hidradenitis suppurativa.  You have chills or a fever.  You are having trouble controlling your symptoms at home.   This information is not intended to replace advice given to you by your health care provider. Make sure you discuss any questions you have with your health care provider.   Document Released: 07/17/2004 Document Revised: 12/24/2014 Document Reviewed: 03/05/2014 Elsevier Interactive Patient Education [employer]

## 2015-12-01 ENCOUNTER — Ambulatory Visit: Payer: 59 | Admitting: Family Medicine

## 2016-02-17 ENCOUNTER — Emergency Department (HOSPITAL_COMMUNITY)
Admission: EM | Admit: 2016-02-17 | Discharge: 2016-02-18 | Discharge status: Against Medical Advice | Payer: 59 | Source: Home / Self Care

## 2016-02-17 NOTE — ED Notes (Signed)
Called in lobby, no answer x 2

## 2016-02-17 NOTE — ED Notes (Signed)
Called in lobby  no answer 

## 2016-03-24 IMAGING — RF DG CHOLANGIOGRAM OPERATIVE
1 series · 8 of 8 positions shown · non-contrast
Comparison: Ultrasound 10/14/2015

CLINICAL DATA: Lap chole done in OR 2; artifact is body jewelry
that was difficult to remove from view

EXAM:
INTRAOPERATIVE CHOLANGIOGRAM
TECHNIQUE: Cholangiographic images from the C-arm fluoroscopic device were
submitted for interpretation post-operatively. Please see the
procedural report for the amount of contrast and the fluoroscopy
time utilized.

[Series 1: run · 2 acquisitions, 8 frames shown]
[im 1/2]
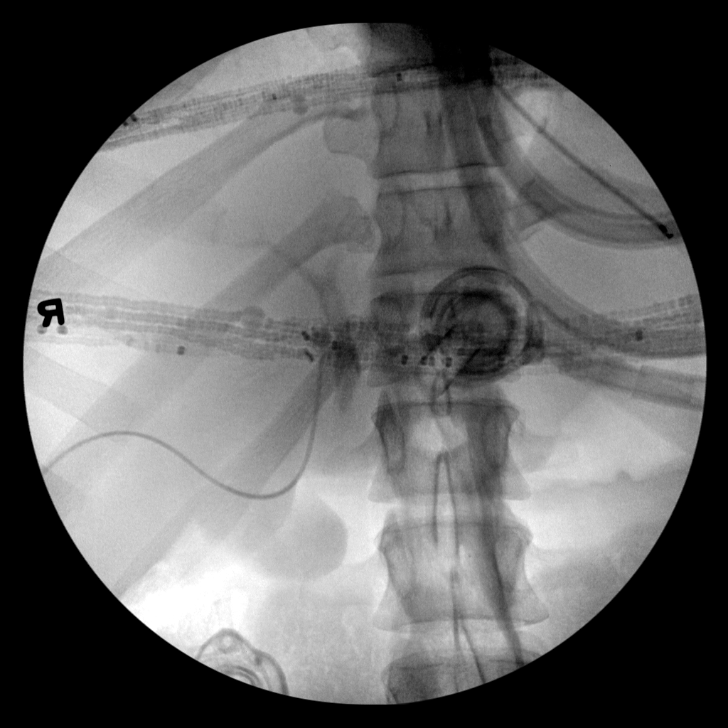
[im 1/2]
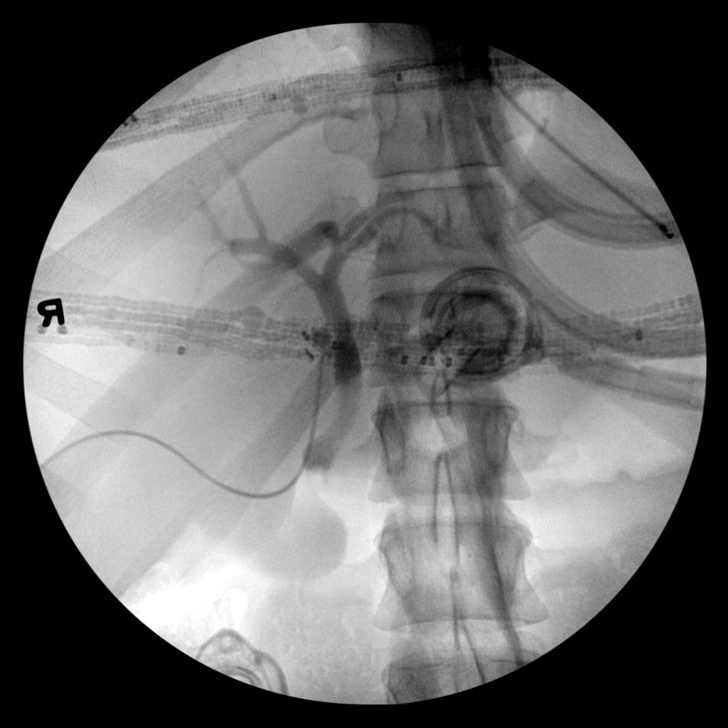
[im 1/2]
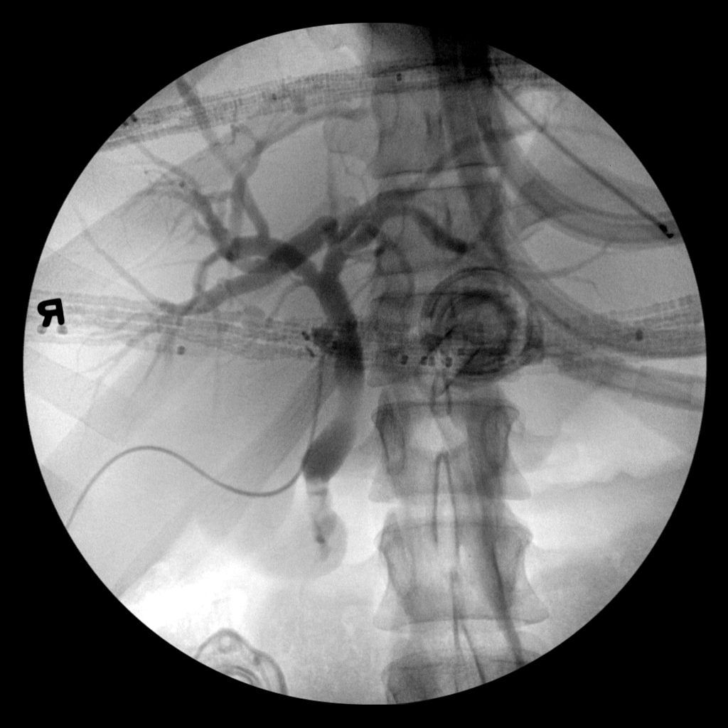
[im 1/2]
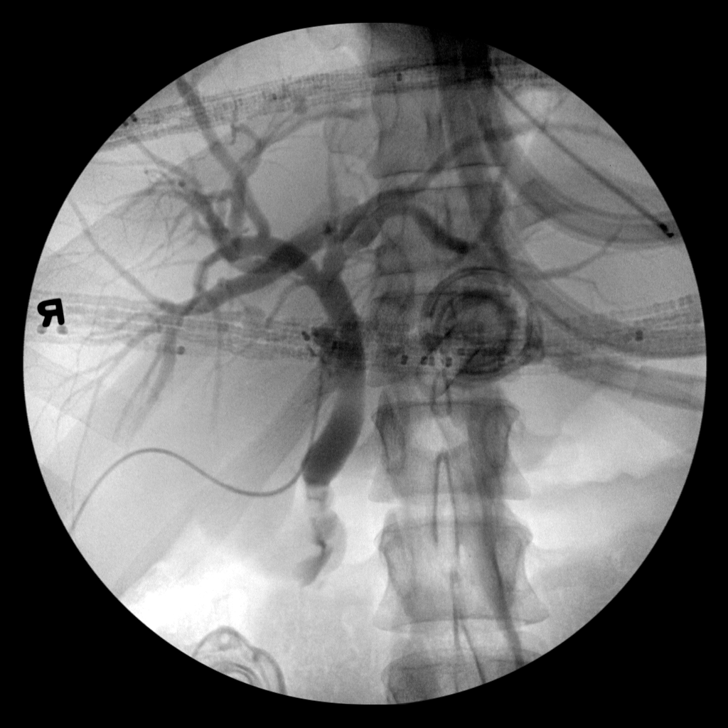
[im 2/2]
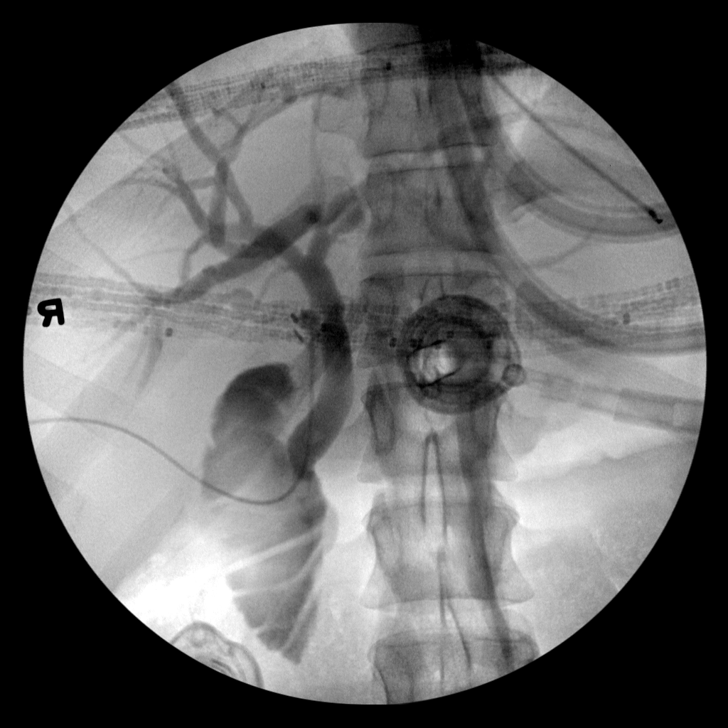
[im 2/2]
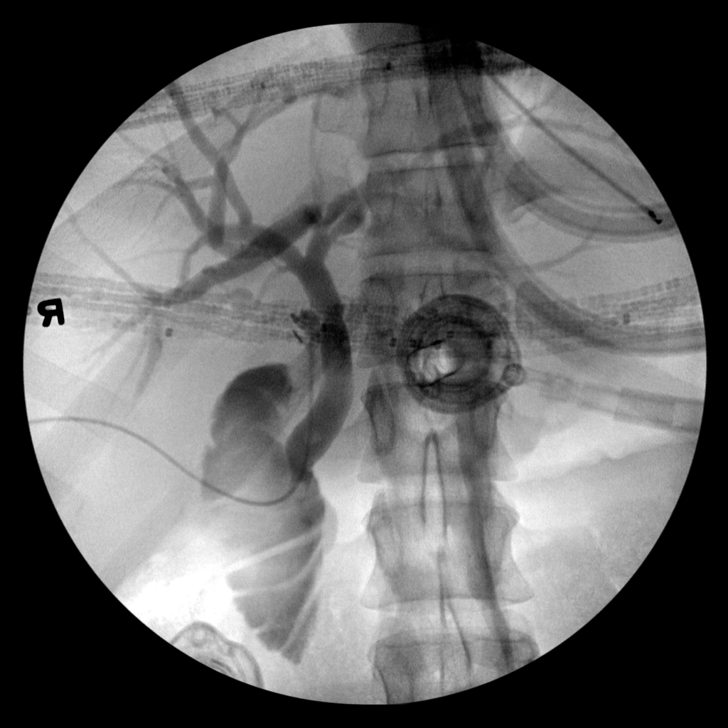
[im 2/2]
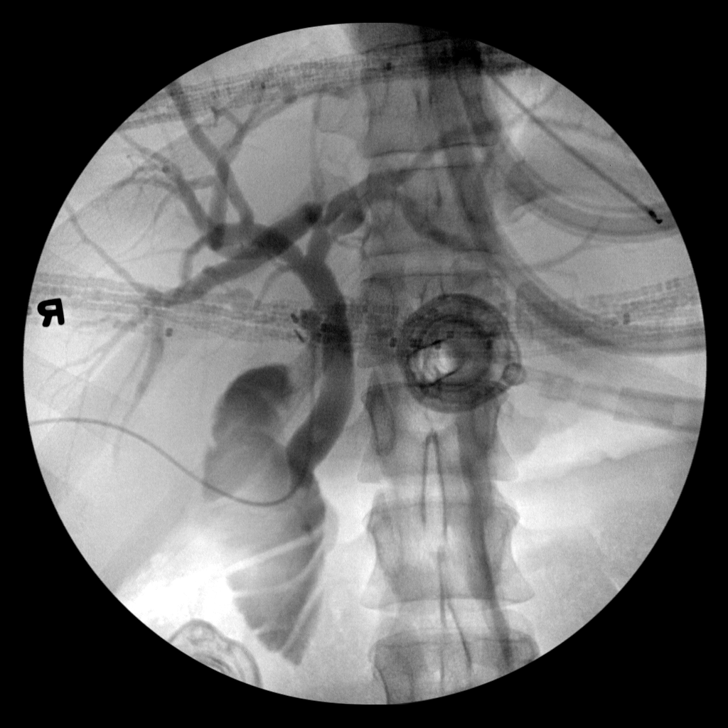
[im 2/2]
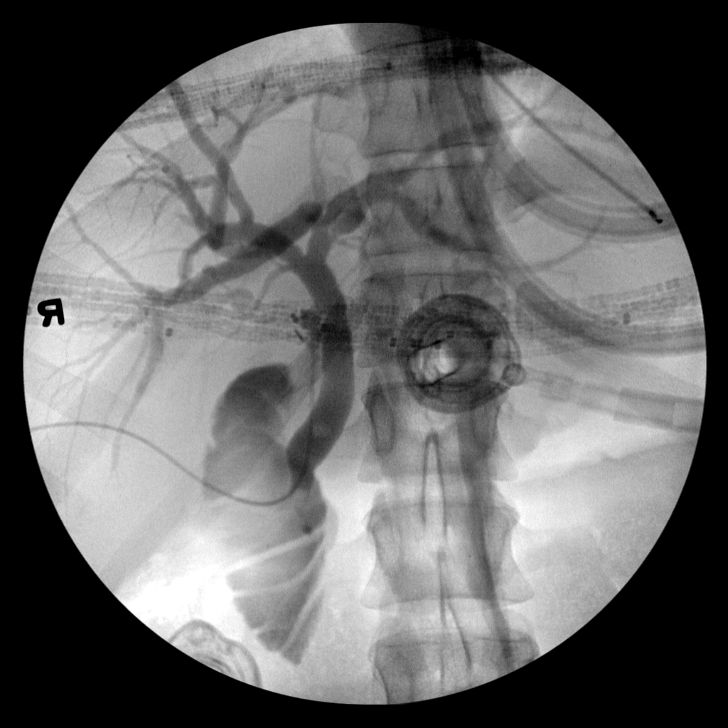

[8 of 8 positions shown; findings below may reference images not displayed]

FINDINGS: At least 2 mobile filling defects in the distal CBD just proximal to
the ampulla. Contrast does flow on into the duodenum. Intrahepatic
ducts are incompletely visualized, appearing mildly distended
centrally.
IMPRESSION: 1. Partially obstructive filling defects in the distal CBD
suggesting choledocholithiasis.

## 2016-06-29 ENCOUNTER — Emergency Department (HOSPITAL_COMMUNITY)
Admission: EM | Admit: 2016-06-29 | Discharge: 2016-06-29 | Disposition: A | Discharge status: Home / Self Care | Payer: BLUE CROSS/BLUE SHIELD | Attending: Emergency Medicine | Admitting: Emergency Medicine

## 2016-06-29 ENCOUNTER — Encounter (HOSPITAL_COMMUNITY): Payer: Self-pay | Admitting: Emergency Medicine

## 2016-06-29 DIAGNOSIS — Z87891 Personal history of nicotine dependence: Secondary | ICD-10-CM | POA: Diagnosis not present

## 2016-06-29 DIAGNOSIS — R110 Nausea: Secondary | ICD-10-CM | POA: Diagnosis not present

## 2016-06-29 DIAGNOSIS — R519 Headache, unspecified: Secondary | ICD-10-CM

## 2016-06-29 DIAGNOSIS — R51 Headache: Secondary | ICD-10-CM | POA: Diagnosis present

## 2016-06-29 DIAGNOSIS — J029 Acute pharyngitis, unspecified: Secondary | ICD-10-CM | POA: Diagnosis not present

## 2016-06-29 LAB — I-STAT BETA HCG BLOOD, ED (MC, WL, AP ONLY)

## 2016-06-29 LAB — RAPID STREP SCREEN (MED CTR MEBANE ONLY): STREPTOCOCCUS, GROUP A SCREEN (DIRECT): NEGATIVE

## 2016-06-29 MED ORDER — ONDANSETRON HCL 4 MG/2ML IJ SOLN
4.0000 mg | Freq: Once | INTRAMUSCULAR | Status: DC
  Filled 2016-06-29: qty 2

## 2016-06-29 MED ORDER — DIPHENHYDRAMINE HCL 50 MG/ML IJ SOLN
12.5000 mg | Freq: Once | INTRAMUSCULAR | Status: DC
  Filled 2016-06-29: qty 1

## 2016-06-29 MED ORDER — SODIUM CHLORIDE 0.9 % IV BOLUS (SEPSIS): 500.0000 mL | Freq: Once | INTRAVENOUS | Status: DC

## 2016-06-29 MED ORDER — ONDANSETRON 4 MG PO TBDP
8.0000 mg | ORAL_TABLET | Freq: Once | ORAL | Status: AC
  Administered 2016-06-29: 8 mg via ORAL
  Filled 2016-06-29: qty 2

## 2016-06-29 MED ORDER — IBUPROFEN 400 MG PO TABS
600.0000 mg | ORAL_TABLET | Freq: Once | ORAL | Status: AC
  Administered 2016-06-29: 600 mg via ORAL
  Filled 2016-06-29: qty 1

## 2016-06-29 MED ORDER — DIPHENHYDRAMINE HCL 25 MG PO CAPS
25.0000 mg | ORAL_CAPSULE | Freq: Once | ORAL | Status: AC
  Administered 2016-06-29: 25 mg via ORAL
  Filled 2016-06-29: qty 1

## 2016-06-29 NOTE — ED Notes (Signed)
Pt reports 1 day hx of headache and nausea. Denies v/d/fever/cough. Pt awake, alert, oriented x4, VSS, NAD at present.

## 2016-06-29 NOTE — Discharge Instructions (Signed)
Read the information below.   You were given anti-nausea, benadryl, and ibupforen in ED for relief of headache. Your rapid strep was negative. It is being sent for culture. If positive you will be notified. Your pregnancy test was negative.  I encourage you to take ibuprofen or tylenol as needed for pain relief. You can use cepacol drops for your sore throat. Warm liquids will also help soothe your throat.  I encourage you to establish a primary care provider. There is a number below that you can call to set up a primary care doctor. You can also call your insurance company or check the website for local providers accepting new patients.  You may return to the Emergency Department at any time for worsening condition or any new symptoms that concern you. Return to ED if your symptoms worsen or you  have trouble breathing, trouble swallowing, unable to swallow, numbness, weakness, changes in vision, facial droop, slurred speech, fever, neck pain, or headache uncontrolled at home.    General Headache Without Cause A headache is pain or discomfort felt around the head or neck area. There are many causes and types of headaches. In some cases, the cause may not be found.  HOME CARE  Managing Pain  Take over-the-counter and prescription medicines only as told by your doctor.  Lie down in a dark, quiet room when you have a headache.  If directed, apply ice to the head and neck area:  Put ice in a plastic bag.  Place a towel between your skin and the bag.  Leave the ice on for 20 minutes, 2-3 times per day.  Use a heating pad or hot shower to apply heat to the head and neck area as told by your doctor.  Keep lights dim if bright lights bother you or make your headaches worse. Eating and Drinking  Eat meals on a regular schedule.  Lessen how much alcohol you drink.  Lessen how much caffeine you drink, or stop drinking caffeine. General Instructions  Keep all follow-up visits as told by your  doctor. This is important.  Keep a journal to find out if certain things bring on headaches. For example, write down:  What you eat and drink.  How much sleep you get.  Any change to your diet or medicines.  Relax by getting a massage or doing other relaxing activities.  Lessen stress.  Sit up straight. Do not tighten (tense) your muscles.  Do not use tobacco products. This includes cigarettes, chewing tobacco, or e-cigarettes. If you need help quitting, ask your doctor.  Exercise regularly as told by your doctor.  Get enough sleep. This often means 7-9 hours of sleep. GET HELP IF:  Your symptoms are not helped by medicine.  You have a headache that feels different than the other headaches.  You feel sick to your stomach (nauseous) or you throw up (vomit).  You have a fever. GET HELP RIGHT AWAY IF:   Your headache becomes really bad.  You keep throwing up.  You have a stiff neck.  You have trouble seeing.  You have trouble speaking.  You have pain in the eye or ear.  Your muscles are weak or you lose muscle control.  You lose your balance or have trouble walking.  You feel like you will pass out (faint) or you pass out.  You have confusion.   This information is not intended to replace advice given to you by your health care provider. Make sure you  discuss any questions you have with your health care provider.   Document Released: 09/11/2008 Document Revised: 08/24/2015 Document Reviewed: 03/28/2015 Elsevier Interactive Patient Education [employer]

## 2016-06-29 NOTE — ED Provider Notes (Signed)
CSN: [id number]     Arrival date & time 06/29/16  4098 History   First MD Initiated Contact with Patient 06/29/16 0957     Chief Complaint  Patient presents with  . Headache  . Nausea     (Consider location/radiation/quality/duration/timing/severity/associated sxs/prior Treatment) HPI Comments: Jessica Vasquez is a 25 y.o. female presents to ED with complaint of headache, nausea, sore throat. Headache started yesterday afternoon in the frontal region, was gradual in onset, 7/10, throbbing in nature. She did not take anything for her headache yesterday. However, she took Tylenol this morning at 6 AM with improvement in pain. She has associated generalized fatigue, subjective fever, sore throat, chills, and lightheadedness. Denies photophobia, phonophobia, changes in vision, weakness, numbness, loss of consciousness, neck pain, history of cancer, URI symptoms. No head trauma.  Patient is a 25 y.o. female presenting with headaches. The history is provided by the patient.  Headache Associated symptoms: fatigue ( generalized), fever (subjective), nausea and sore throat   Associated symptoms: no abdominal pain, no diarrhea, no dizziness, no neck pain, no neck stiffness, no numbness, no vomiting and no weakness     Past Medical History  Diagnosis Date  . Anemia   . Herpes     last outbreak two weeks ago ( mid march 2015)  . Hydradenitis   . Cholelithiasis    Past Surgical History  Procedure Laterality Date  . Nasal sugery    . Laparoscopic cholecystectomy w/ cholangiography    . Ercp N/A 10/16/2015    Procedure: ENDOSCOPIC RETROGRADE CHOLANGIOPANCREATOGRAPHY (ERCP);  Surgeon: Iva Boop, MD;  Location: [facility] ENDOSCOPY;  Service: Endoscopy;  Laterality: N/A;  . Cholecystectomy N/A 10/15/2015    Procedure: LAPAROSCOPIC CHOLECYSTECTOMY WITH INTRAOPERATIVE CHOLANGIOGRAM;  Surgeon: Manus Rudd, MD;  Location: [facility] OR;  Service: General;  Laterality: N/A;   Family History  Problem Relation Age  of Onset  . Gallstones Mother    Social History  Substance Use Topics  . Smoking status: Former Smoker    Quit date: 05/16/2011  . Smokeless tobacco: Never Used  . Alcohol Use: No   OB History    Gravida Para Term Preterm AB TAB SAB Ectopic Multiple Living   2 2 2  0 0 0 0 0 0 2     Review of Systems  Constitutional: Positive for fever (subjective), chills and fatigue ( generalized). Negative for diaphoresis.  HENT: Positive for sore throat.   Eyes: Negative for visual disturbance.  Respiratory: Negative for shortness of breath.   Cardiovascular: Negative for chest pain.  Gastrointestinal: Positive for nausea. Negative for vomiting, abdominal pain, diarrhea and constipation.  Genitourinary: Negative for dysuria and hematuria.  Musculoskeletal: Negative for neck pain and neck stiffness.  Skin: Negative for rash.  Neurological: Positive for light-headedness and headaches. Negative for dizziness, weakness and numbness.    Allergies  Review of patient's allergies indicates no known allergies.  Home Medications   Prior to Admission medications   Medication Sig Start Date End Date Taking? Authorizing Provider  chlorhexidine (HIBICLENS) 4 % external liquid Apply topically daily as needed. 11/03/15   Stevi Barrett, PA-C  oxyCODONE (OXY IR/ROXICODONE) 5 MG immediate release tablet Take 1-2 tablets (5-10 mg total) by mouth every 6 (six) hours as needed for moderate pain. 10/21/15   Tiffany Netta Cedars, PA-C  promethazine (PHENERGAN) 25 MG tablet Take 1 tablet (25 mg total) by mouth every 8 (eight) hours as needed for nausea or vomiting. 10/21/15   Vivianne Master, PA-C   BP  111/62 mmHg  Pulse 65  Temp(Src) 99.1 F (37.3 C) (Oral)  Resp 20  SpO2 100%  LMP 06/14/2016 Physical Exam  Constitutional: She appears well-developed and well-nourished. No distress.  HENT:  Head: Normocephalic and atraumatic.  Right Ear: Tympanic membrane, external ear and ear canal normal.  Left Ear: External  ear and ear canal normal.  Nose: Right sinus exhibits no maxillary sinus tenderness and no frontal sinus tenderness. Left sinus exhibits no maxillary sinus tenderness and no frontal sinus tenderness.  Mouth/Throat: Uvula is midline and mucous membranes are normal. No trismus in the jaw. No uvula swelling. Posterior oropharyngeal erythema ( mild) present. No oropharyngeal exudate, posterior oropharyngeal edema or tonsillar abscesses.  No tonsillar hypertrophy or exudate. No sublingual swelling.   Eyes: Conjunctivae and EOM are normal. Pupils are equal, round, and reactive to light. Right eye exhibits no discharge. Left eye exhibits no discharge. No scleral icterus.  Neck: Normal range of motion and phonation normal. Neck supple. Normal range of motion present.  Cardiovascular: Normal rate, regular rhythm, normal heart sounds and intact distal pulses.   No murmur heard. Pulmonary/Chest: Effort normal and breath sounds normal. No stridor. No respiratory distress.  Abdominal: Soft. Bowel sounds are normal. There is no tenderness. There is no rebound and no guarding.  Musculoskeletal: Normal range of motion.  Lymphadenopathy:    She has no cervical adenopathy.  Neurological: She is alert. She displays a negative Romberg sign. Coordination normal.  Mental Status:  Alert, thought content appropriate, able to give a coherent history. Speech fluent without evidence of aphasia. Able to follow 2 step commands without difficulty.  Cranial Nerves:  II:  Peripheral visual fields grossly normal, pupils equal, round, reactive to light III,IV, VI: ptosis not present, extra-ocular motions intact bilaterally  V,VII: smile symmetric, facial light touch sensation equal VIII: hearing grossly normal to voice  X: uvula elevates symmetrically  XI: bilateral shoulder shrug symmetric and strong XII: midline tongue extension without fassiculations Motor:  Normal tone. 5/5 in upper and lower extremities bilaterally  including strong and equal grip strength and dorsiflexion/plantar flexion Sensory: light touch normal in all extremities.  Cerebellar: normal finger-to-nose with bilateral upper extremities Gait: normal gait and balance CV: distal pulses palpable throughout  Skin: Skin is warm and dry. She is not diaphoretic.  Psychiatric: She has a normal mood and affect.    ED Course  Procedures (including critical care time) Labs Review Labs Reviewed  RAPID STREP SCREEN (NOT AT [facility])  CULTURE, GROUP A STREP ([facility])  I-STAT BETA HCG BLOOD, ED ([facility], [facility], [facility] ONLY)    Imaging Review No results found. I have personally reviewed and evaluated these images and lab results as part of my medical decision-making.   EKG Interpretation None     Filed Vitals:   06/29/16 1100 06/29/16 1130 06/29/16 1200 06/29/16 1220  BP: 118/74 99/43 102/60 111/62  Pulse: 71 59 54 65  Temp:      TempSrc:      Resp:    20  SpO2: 100% 99% 99% 100%    MDM   Final diagnoses:  Nonintractable headache, unspecified chronicity pattern, unspecified headache type  Sore throat    Pt is afebrile and non-toxic appearing in NAD. Vital signs are stable. Mild erythema noted on posterior pharynx. No tonsillar hypertrophy or exudate. Uvula is midline. No trismus. Pt is managing her oral secretions. Normal neurologic exam. No nuchal rigidity. No changes in vision. Presentation non-concerning for [facility], ICH, meningitis, or temporal arteritis.  Rapid strep negative, will culture. Low suspicion for PTA or soft tissue infxn. b-hcg negative. ?primary headache vs. ?new onset URI vs. viral pharyngitis.   HA treated and resolved while in ED. Discussed results and plan with pt. Discussed symptomatic management. Pt is to follow up with PCP, provided contact information for establishing PCP. Discussed return precautions. Pt verbalizes understanding and is agreeable with plan to dc.         Lona KettleAshley Laurel Meyer, PA-C 06/29/16 2308  Nelva Nayobert  Beaton, MD 07/01/16 [phone]

## 2016-06-29 NOTE — ED Notes (Signed)
Pt requested PO medications and no IV.  Notified [place], [place].

## 2016-06-29 NOTE — ED Notes (Signed)
Pt states he understands instructions  

## 2016-07-01 LAB — CULTURE, GROUP A STREP (THRC)

## 2017-01-06 ENCOUNTER — Encounter (HOSPITAL_COMMUNITY): Payer: Self-pay | Admitting: *Deleted

## 2017-01-06 ENCOUNTER — Emergency Department (HOSPITAL_COMMUNITY)
Admission: EM | Admit: 2017-01-06 | Discharge: 2017-01-07 | Disposition: A | Discharge status: Home / Self Care | Payer: BLUE CROSS/BLUE SHIELD | Attending: Emergency Medicine | Admitting: Emergency Medicine

## 2017-01-06 DIAGNOSIS — R197 Diarrhea, unspecified: Secondary | ICD-10-CM | POA: Diagnosis not present

## 2017-01-06 DIAGNOSIS — Z5321 Procedure and treatment not carried out due to patient leaving prior to being seen by health care provider: Secondary | ICD-10-CM | POA: Insufficient documentation

## 2017-01-06 LAB — CBC
HCT: 34.6 % — ABNORMAL LOW (ref 36.0–46.0)
Hemoglobin: 11.3 g/dL — ABNORMAL LOW (ref 12.0–15.0)
MCH: 27.9 pg (ref 26.0–34.0)
MCHC: 32.7 g/dL (ref 30.0–36.0)
MCV: 85.4 fL (ref 78.0–100.0)
PLATELETS: 259 10*3/uL (ref 150–400)
RBC: 4.05 MIL/uL (ref 3.87–5.11)
RDW: 13.6 % (ref 11.5–15.5)
WBC: 6.8 10*3/uL (ref 4.0–10.5)

## 2017-01-06 LAB — URINALYSIS, ROUTINE W REFLEX MICROSCOPIC
Bilirubin Urine: NEGATIVE
Glucose, UA: NEGATIVE mg/dL
Ketones, ur: NEGATIVE mg/dL
Leukocytes, UA: NEGATIVE
Nitrite: NEGATIVE
Protein, ur: NEGATIVE mg/dL
SPECIFIC GRAVITY, URINE: 1.004 — AB (ref 1.005–1.030)
pH: 9 — ABNORMAL HIGH (ref 5.0–8.0)

## 2017-01-06 LAB — COMPREHENSIVE METABOLIC PANEL
ALBUMIN: 4.2 g/dL (ref 3.5–5.0)
ALK PHOS: 45 U/L (ref 38–126)
ALT: 36 U/L (ref 14–54)
AST: 30 U/L (ref 15–41)
Anion gap: 5 (ref 5–15)
BUN: 7 mg/dL (ref 6–20)
CALCIUM: 9.1 mg/dL (ref 8.9–10.3)
CO2: 25 mmol/L (ref 22–32)
CREATININE: 0.63 mg/dL (ref 0.44–1.00)
Chloride: 108 mmol/L (ref 101–111)
GFR calc Af Amer: >60 mL/min (ref >60)
GFR calc non Af Amer: >60 mL/min (ref >60)
GLUCOSE: 79 mg/dL (ref 65–99)
Potassium: 4 mmol/L (ref 3.5–5.1)
SODIUM: 138 mmol/L (ref 135–145)
Total Bilirubin: 0.4 mg/dL (ref 0.3–1.2)
Total Protein: 7.2 g/dL (ref 6.5–8.1)

## 2017-01-06 LAB — LIPASE, BLOOD: Lipase: 28 U/L (ref 11–51)

## 2017-01-06 LAB — POC URINE PREG, ED: PREG TEST UR: NEGATIVE

## 2017-01-06 NOTE — ED Triage Notes (Signed)
Pt reports for three days she has had a burning in her abdomen and diarrhea. Pt has been taking probiotics, no other medications for the same.

## 2017-01-06 NOTE — ED Notes (Signed)
Called pt x2 for recheck on VS no answer from lobby.

## 2017-09-16 ENCOUNTER — Ambulatory Visit (HOSPITAL_COMMUNITY)
Admission: EM | Admit: 2017-09-16 | Discharge: 2017-09-16 | Disposition: A | Discharge status: Home / Self Care | Payer: Medicaid Other | Attending: Internal Medicine | Admitting: Internal Medicine

## 2017-09-16 ENCOUNTER — Encounter (HOSPITAL_COMMUNITY): Payer: Self-pay | Admitting: Emergency Medicine

## 2017-09-16 DIAGNOSIS — Z113 Encounter for screening for infections with a predominantly sexual mode of transmission: Secondary | ICD-10-CM | POA: Diagnosis not present

## 2017-09-16 DIAGNOSIS — L03039 Cellulitis of unspecified toe: Secondary | ICD-10-CM | POA: Insufficient documentation

## 2017-09-16 DIAGNOSIS — L03031 Cellulitis of right toe: Secondary | ICD-10-CM

## 2017-09-16 DIAGNOSIS — Z87891 Personal history of nicotine dependence: Secondary | ICD-10-CM | POA: Diagnosis not present

## 2017-09-16 DIAGNOSIS — Z79899 Other long term (current) drug therapy: Secondary | ICD-10-CM | POA: Insufficient documentation

## 2017-09-16 DIAGNOSIS — Z9049 Acquired absence of other specified parts of digestive tract: Secondary | ICD-10-CM | POA: Diagnosis not present

## 2017-09-16 DIAGNOSIS — M79674 Pain in right toe(s): Secondary | ICD-10-CM | POA: Diagnosis present

## 2017-09-16 MED ORDER — SULFAMETHOXAZOLE-TRIMETHOPRIM 800-160 MG PO TABS: 1.0000 | ORAL_TABLET | Freq: Two times a day (BID) | ORAL | 0 refills | Status: AC

## 2017-09-16 MED ORDER — CEPHALEXIN 500 MG PO CAPS: 500.0000 mg | ORAL_CAPSULE | Freq: Two times a day (BID) | ORAL | 0 refills | Status: AC

## 2017-09-16 MED ORDER — MUPIROCIN 2 % EX OINT: 1.0000 "application " | TOPICAL_OINTMENT | Freq: Two times a day (BID) | CUTANEOUS | 0 refills | Status: AC

## 2017-09-16 NOTE — ED Triage Notes (Signed)
Pt here for 3rd digit on right toe pain x 3 days

## 2017-09-16 NOTE — Discharge Instructions (Addendum)
Warm compresses to right 3rd toe for 5-10 minutes 2-3 times daily.  Apply antibiotic ointment and bandage.  Prescriptions for antibiotics (trimethoprim/sulfa and cephalexin) sent to the pharmacy.  Anticipate gradual improvement in pain/swelling over the next several days.  Recheck for increasing redness/swelling/drainage/pain at site or new fever >100.5.   Tests for common STDs were done at the urgent care tonight; the urgent care will contact you if further treatment is needed.

## 2017-09-16 NOTE — ED Provider Notes (Signed)
[facility]    CSN: [id number] Arrival date & time: 09/16/17  1704     History   Chief Complaint Chief Complaint  Patient presents with  . Toe Pain    HPI Jessica Vasquez is a 26 y.o. female. She presents today with 3 day history of pain and swelling around the nail of the right third toe. She has tried unsuccessfully to lance this. She has a little crusting at the medial corner of the nail, but no obvious pointing. No injury. No fever, no malaise. Also is interested in having STD testing. No vaginal irritation, no discharge, no urinary symptoms.   HPI  Past Medical History:  Diagnosis Date  . Anemia   . Cholelithiasis   . Herpes    last outbreak two weeks ago ( mid march 2015)  . Hydradenitis     Patient Active Problem List   Diagnosis Date Noted  . Choledocholithiasis   . Cholelithiasis 10/14/2015  . NVD (normal vaginal delivery) 04/27/2014  . Indication for care in labor or delivery 04/26/2014  . Cholestasis of pregnancy 03/17/2014  . BV (bacterial vaginosis) 11/17/2013  . Supervision of other normal pregnancy 10/20/2013  . Anemia 10/20/2013  . 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 Chronic cholecystitis with calculus 12/29/2012    Past Surgical History:  Procedure Laterality Date  . CHOLECYSTECTOMY N/A 10/15/2015   Procedure: LAPAROSCOPIC CHOLECYSTECTOMY WITH INTRAOPERATIVE CHOLANGIOGRAM;  Surgeon: Manus Rudd, MD;  Location: [facility] OR;  Service: General;  Laterality: N/A;  . ERCP N/A 10/16/2015   Procedure: ENDOSCOPIC RETROGRADE CHOLANGIOPANCREATOGRAPHY (ERCP);  Surgeon: Iva Boop, MD;  Location: [facility] ENDOSCOPY;  Service: Endoscopy;  Laterality: N/A;  . LAPAROSCOPIC CHOLECYSTECTOMY W/ CHOLANGIOGRAPHY    . nasal sugery      OB History    Gravida Para Term Preterm AB Living   0 0 2   SAB TAB Ectopic Multiple Live Births   0 0 0 0 2       Home Medications    Prior to Admission medications   Medication Sig Start Date End  Date Taking? Authorizing Provider  cephALEXin (KEFLEX) 500 MG capsule Take 1 capsule (500 mg total) by mouth 2 (two) times daily. 09/16/17 09/21/17  Eustace Moore, MD  chlorhexidine (HIBICLENS) 4 % external liquid Apply topically daily as needed. 11/03/15   Barrett, Rolm Gala, PA-C  mupirocin ointment (BACTROBAN) 2 % Apply 1 application topically 2 (two) times daily. 09/16/17 09/23/17  Eustace Moore, MD  oxyCODONE (OXY IR/ROXICODONE) 5 MG immediate release tablet Take 1-2 tablets (5-10 mg total) by mouth every 6 (six) hours as needed for moderate pain. 10/21/15   Vivianne Master, PA-C  promethazine (PHENERGAN) 25 MG tablet Take 1 tablet (25 mg total) by mouth every 8 (eight) hours as needed for nausea or vomiting. 10/21/15   Vivianne Master, PA-C  sulfamethoxazole-trimethoprim (BACTRIM DS,SEPTRA DS) 800-160 MG tablet Take 1 tablet by mouth 2 (two) times daily. 09/16/17 09/21/17  Eustace Moore, MD    Family History Family History  Problem Relation Age of Onset  . Gallstones Mother     Social History Social History  Substance Use Topics  . Smoking status: Former Smoker    Quit date: 05/16/2011  . Smokeless tobacco: Never Used  . Alcohol use No     Allergies   Patient has no known allergies.   Review of Systems Review of Systems  All other systems reviewed and are negative.  Physical Exam Triage Vital Signs ED Triage Vitals [09/16/17 1721]  Enc Vitals Group     BP 122/77     Pulse Rate 75     Resp 18     Temp 98.2 F (36.8 C)     Temp Source Oral     SpO2 100 %     Weight      Height      Pain Score 8     Pain Loc    Updated Vital Signs BP 122/77 (BP Location: Right Arm)   Pulse 75   Temp 98.2 F (36.8 C) (Oral)   Resp 18   SpO2 100%   Physical Exam  Constitutional: She is oriented to person, place, and time. No distress.  HENT:  Head: Atraumatic.  Eyes:  Conjugate gaze observed, no eye redness/discharge  Neck: Neck supple.  Cardiovascular: Normal rate.     Pulmonary/Chest: No respiratory distress.  Abdominal: She exhibits no distension.  Musculoskeletal: Normal range of motion.  Neurological: She is alert and oriented to person, place, and time.  Skin: Skin is warm and dry.  Mild puffiness and tenderness to palpation at the proximal and medial aspects of the right third toenail fold. Not able to express any material, no fluctuance. Scant crusting at the corner of the toenail.  Nursing note and vitals reviewed.    UC Treatments / Results  Labs (all labs ordered are listed, but only abnormal results are displayed) Labs Reviewed  RPR  HIV ANTIBODY (ROUTINE TESTING)  HEPATITIS PANEL, ACUTE  URINE CYTOLOGY ANCILLARY ONLY   Procedures Procedures (including critical care time) None today  Final Clinical Impressions(s) / UC Diagnoses   Final diagnoses:  Paronychia of third toe of right foot  Screen for STD (sexually transmitted disease)   Warm compresses to right 3rd toe for 5-10 minutes 2-3 times daily.  Apply antibiotic ointment and bandage.  Prescriptions for antibiotics (trimethoprim/sulfa and cephalexin) sent to the pharmacy.  Anticipate gradual improvement in pain/swelling over the next several days.  Recheck for increasing redness/swelling/drainage/pain at site or new fever >100.5.   Tests for common STDs were done at the urgent care tonight; the urgent care will contact you if further treatment is needed.    New Prescriptions New Prescriptions   CEPHALEXIN (KEFLEX) 500 MG CAPSULE    Take 1 capsule (500 mg total) by mouth 2 (two) times daily.   MUPIROCIN OINTMENT (BACTROBAN) 2 %    Apply 1 application topically 2 (two) times daily.   SULFAMETHOXAZOLE-TRIMETHOPRIM (BACTRIM DS,SEPTRA DS) 800-160 MG TABLET    Take 1 tablet by mouth 2 (two) times daily.     Controlled Substance Prescriptions [place] Controlled Substance Registry consulted? No   Eustace Moore, MD 09/17/17 1346

## 2017-09-17 LAB — RPR: RPR Ser Ql: NONREACTIVE

## 2017-09-17 LAB — URINE CYTOLOGY ANCILLARY ONLY
CHLAMYDIA, DNA PROBE: NEGATIVE
NEISSERIA GONORRHEA: NEGATIVE
Trichomonas: NEGATIVE

## 2017-09-17 LAB — HIV ANTIBODY (ROUTINE TESTING W REFLEX): HIV SCREEN 4TH GENERATION: NONREACTIVE

## 2017-09-18 LAB — HEPATITIS PANEL, ACUTE
HEP A IGM: NEGATIVE
HEP B S AG: NEGATIVE
Hep B C IgM: NEGATIVE

## 2017-09-19 LAB — URINE CYTOLOGY ANCILLARY ONLY
Bacterial vaginitis: POSITIVE — AB
Candida vaginitis: NEGATIVE

## 2017-09-21 ENCOUNTER — Telehealth (HOSPITAL_COMMUNITY): Payer: Self-pay | Admitting: Emergency Medicine

## 2017-09-21 MED ORDER — METRONIDAZOLE 500 MG PO TABS: 500.0000 mg | ORAL_TABLET | Freq: Two times a day (BID) | ORAL | 0 refills | Status: DC

## 2017-09-21 NOTE — Telephone Encounter (Signed)
Called pt to notify of recent lab results.... Pt ID'd properly Reports feeling better and sx are subsiding Pt would like for [place] to call in Flagyl to [facility]) even though she is asymptomatic.  Pt is taking/tolarating well meds given at visit.  Adv pt if sx are not getting better to return or to f/u w/PCP Education on safe sex given Notified pt that lab results can be obtained through MyChart Pt verb understanding.

## 2017-09-21 NOTE — Telephone Encounter (Signed)
-----   Message from Eustace Moore, MD sent at 09/19/2017  8:00 PM EDT ----- Clinical staff, please let patient know of late result, test for gardnerella (bacterial vaginosis) was positive.  This only needs to be treated if there are symptoms, such as vaginal irritation/discharge.  If these symptoms are present, ok to send rx for metronidazole  bid x 7d #14 no refills or metronidazole vaginal gel 0.75% 1 applicatorful bid x 7d #14 no refills.  Recheck for further evaluation if symptoms are not improving. Other test results were negative.   Result note sent to patient's MyChart.  LM

## 2018-05-13 ENCOUNTER — Other Ambulatory Visit: Payer: Medicaid Other | Admitting: General Practice

## 2018-05-13 DIAGNOSIS — Z3202 Encounter for pregnancy test, result negative: Secondary | ICD-10-CM | POA: Diagnosis not present

## 2018-05-13 LAB — POCT PREGNANCY, URINE: Preg Test, Ur: NEGATIVE

## 2018-05-13 NOTE — Progress Notes (Signed)
Chart reviewed for nurse visit. Agree with plan of care.   Pincus Large, DO 05/13/2018 1:15 PM

## 2018-05-13 NOTE — Progress Notes (Signed)
Patient presents to office today for UPT. UPT -. Patient reports period is 9 days late. Discussed that her urine pregnancy test is negative and sometimes women miss/late to cycles. Discussed follow up if needed. Patient verbalized understanding & had no questions.

## 2018-09-16 ENCOUNTER — Emergency Department (HOSPITAL_COMMUNITY)
Admission: EM | Admit: 2018-09-16 | Discharge: 2018-09-16 | Disposition: A | Discharge status: Home / Self Care | Payer: Medicaid Other | Attending: Emergency Medicine | Admitting: Emergency Medicine

## 2018-09-16 ENCOUNTER — Encounter (HOSPITAL_COMMUNITY): Payer: Self-pay | Admitting: Emergency Medicine

## 2018-09-16 DIAGNOSIS — Z79899 Other long term (current) drug therapy: Secondary | ICD-10-CM | POA: Diagnosis not present

## 2018-09-16 DIAGNOSIS — S39012A Strain of muscle, fascia and tendon of lower back, initial encounter: Secondary | ICD-10-CM | POA: Diagnosis not present

## 2018-09-16 DIAGNOSIS — T148XXA Other injury of unspecified body region, initial encounter: Secondary | ICD-10-CM

## 2018-09-16 DIAGNOSIS — Y92410 Unspecified street and highway as the place of occurrence of the external cause: Secondary | ICD-10-CM | POA: Insufficient documentation

## 2018-09-16 DIAGNOSIS — S46912A Strain of unspecified muscle, fascia and tendon at shoulder and upper arm level, left arm, initial encounter: Secondary | ICD-10-CM | POA: Insufficient documentation

## 2018-09-16 DIAGNOSIS — Y9389 Activity, other specified: Secondary | ICD-10-CM | POA: Diagnosis not present

## 2018-09-16 DIAGNOSIS — S3992XA Unspecified injury of lower back, initial encounter: Secondary | ICD-10-CM | POA: Diagnosis present

## 2018-09-16 DIAGNOSIS — Z87891 Personal history of nicotine dependence: Secondary | ICD-10-CM | POA: Insufficient documentation

## 2018-09-16 DIAGNOSIS — Y999 Unspecified external cause status: Secondary | ICD-10-CM | POA: Insufficient documentation

## 2018-09-16 MED ORDER — CYCLOBENZAPRINE HCL 10 MG PO TABS: 10.0000 mg | ORAL_TABLET | Freq: Two times a day (BID) | ORAL | 0 refills | Status: DC | PRN

## 2018-09-16 MED ORDER — MELOXICAM 7.5 MG PO TABS: 7.5000 mg | ORAL_TABLET | Freq: Every day | ORAL | 0 refills | Status: AC
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
## 2018-09-16 NOTE — Discharge Instructions (Addendum)
Apply warm compresses to sore and achy muscles for 20 minutes at a time. Take meloxicam and Flexeril as needed as prescribed.  Do not take Flexeril if driving or operating machinery.   Recheck with your PCP, referral given if needed.

## 2018-10-09 ENCOUNTER — Encounter (HOSPITAL_COMMUNITY): Payer: Self-pay | Admitting: Emergency Medicine

## 2018-10-09 ENCOUNTER — Other Ambulatory Visit: Payer: Self-pay

## 2018-10-09 DIAGNOSIS — Z87891 Personal history of nicotine dependence: Secondary | ICD-10-CM | POA: Diagnosis not present

## 2018-10-09 DIAGNOSIS — Y929 Unspecified place or not applicable: Secondary | ICD-10-CM | POA: Diagnosis not present

## 2018-10-09 DIAGNOSIS — S301XXA Contusion of abdominal wall, initial encounter: Secondary | ICD-10-CM | POA: Insufficient documentation

## 2018-10-09 DIAGNOSIS — Y939 Activity, unspecified: Secondary | ICD-10-CM | POA: Insufficient documentation

## 2018-10-09 DIAGNOSIS — R51 Headache: Secondary | ICD-10-CM | POA: Diagnosis not present

## 2018-10-09 DIAGNOSIS — Y999 Unspecified external cause status: Secondary | ICD-10-CM | POA: Diagnosis not present

## 2018-10-09 DIAGNOSIS — S161XXA Strain of muscle, fascia and tendon at neck level, initial encounter: Secondary | ICD-10-CM | POA: Insufficient documentation

## 2018-10-09 DIAGNOSIS — M25512 Pain in left shoulder: Secondary | ICD-10-CM | POA: Insufficient documentation

## 2018-10-09 DIAGNOSIS — M25522 Pain in left elbow: Secondary | ICD-10-CM | POA: Insufficient documentation

## 2018-10-09 DIAGNOSIS — M542 Cervicalgia: Secondary | ICD-10-CM | POA: Diagnosis present
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
## 2018-10-10 ENCOUNTER — Emergency Department (HOSPITAL_COMMUNITY): Payer: Medicaid Other

## 2018-10-10 ENCOUNTER — Encounter (HOSPITAL_COMMUNITY): Payer: Self-pay

## 2018-10-10 ENCOUNTER — Emergency Department (HOSPITAL_COMMUNITY)
Admission: EM | Admit: 2018-10-10 | Discharge: 2018-10-10 | Disposition: A | Discharge status: Home / Self Care | Payer: Medicaid Other | Attending: Emergency Medicine | Admitting: Emergency Medicine

## 2018-10-10 DIAGNOSIS — S301XXA Contusion of abdominal wall, initial encounter: Secondary | ICD-10-CM

## 2018-10-10 DIAGNOSIS — S161XXA Strain of muscle, fascia and tendon at neck level, initial encounter: Secondary | ICD-10-CM

## 2018-10-10 LAB — I-STAT CHEM 8, ED
BUN: 5 mg/dL — ABNORMAL LOW (ref 6–20)
CREATININE: 0.7 mg/dL (ref 0.44–1.00)
Calcium, Ion: 1.18 mmol/L (ref 1.15–1.40)
Chloride: 104 mmol/L (ref 98–111)
GLUCOSE: 101 mg/dL — AB (ref 70–99)
HCT: 36 % (ref 36.0–46.0)
HEMOGLOBIN: 12.2 g/dL (ref 12.0–15.0)
POTASSIUM: 3.7 mmol/L (ref 3.5–5.1)
Sodium: 139 mmol/L (ref 135–145)
TCO2: 26 mmol/L (ref 22–32)

## 2018-10-10 LAB — I-STAT BETA HCG BLOOD, ED (MC, WL, AP ONLY)

## 2018-10-10 MED ORDER — IBUPROFEN 800 MG PO TABS: 800.0000 mg | ORAL_TABLET | Freq: Three times a day (TID) | ORAL | 0 refills | Status: DC

## 2018-10-10 MED ORDER — IOPAMIDOL (ISOVUE-300) INJECTION 61%
100.0000 mL | Freq: Once | INTRAVENOUS | Status: AC | PRN
  Administered 2018-10-10: 100 mL via INTRAVENOUS

## 2018-10-10 MED ORDER — IBUPROFEN 800 MG PO TABS
800.0000 mg | ORAL_TABLET | Freq: Once | ORAL | Status: AC
  Administered 2018-10-10: 800 mg via ORAL
  Filled 2018-10-10: qty 1

## 2018-10-10 MED ORDER — IOPAMIDOL (ISOVUE-300) INJECTION 61%
INTRAVENOUS | Status: AC
  Filled 2018-10-10: qty 100

## 2018-10-10 MED ORDER — HYDROCODONE-ACETAMINOPHEN 5-325 MG PO TABS: 1.0000 | ORAL_TABLET | Freq: Once | ORAL | Status: DC

## 2018-10-10 MED ORDER — TRAMADOL HCL 50 MG PO TABS: 50.0000 mg | ORAL_TABLET | Freq: Four times a day (QID) | ORAL | 0 refills | Status: DC | PRN

## 2018-10-10 MED ORDER — SODIUM CHLORIDE 0.9 % IJ SOLN
INTRAMUSCULAR | Status: AC
  Filled 2018-10-10: qty 50
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
## 2018-10-10 NOTE — ED Notes (Signed)
Bed: [facility] Expected date:  Expected time:  Means of arrival:  Comments: 

## 2018-11-25 ENCOUNTER — Encounter (HOSPITAL_COMMUNITY): Payer: Self-pay | Admitting: Emergency Medicine

## 2018-11-25 ENCOUNTER — Emergency Department (HOSPITAL_COMMUNITY)
Admission: EM | Admit: 2018-11-25 | Discharge: 2018-11-25 | Disposition: A | Discharge status: Home / Self Care | Payer: Medicaid Other | Attending: Emergency Medicine | Admitting: Emergency Medicine

## 2018-11-25 DIAGNOSIS — L0231 Cutaneous abscess of buttock: Secondary | ICD-10-CM | POA: Diagnosis not present

## 2018-11-25 DIAGNOSIS — L02411 Cutaneous abscess of right axilla: Secondary | ICD-10-CM | POA: Diagnosis present

## 2018-11-25 DIAGNOSIS — Z87891 Personal history of nicotine dependence: Secondary | ICD-10-CM | POA: Insufficient documentation

## 2018-11-25 DIAGNOSIS — L02412 Cutaneous abscess of left axilla: Secondary | ICD-10-CM | POA: Diagnosis not present

## 2018-11-25 DIAGNOSIS — L02415 Cutaneous abscess of right lower limb: Secondary | ICD-10-CM | POA: Diagnosis not present

## 2018-11-25 DIAGNOSIS — L0291 Cutaneous abscess, unspecified: Secondary | ICD-10-CM

## 2018-11-25 LAB — POC URINE PREG, ED: PREG TEST UR: NEGATIVE

## 2018-11-25 MED ORDER — HYDROCODONE-ACETAMINOPHEN 5-325 MG PO TABS
1.0000 | ORAL_TABLET | Freq: Once | ORAL | Status: AC
  Administered 2018-11-25: 1 via ORAL
  Filled 2018-11-25: qty 1

## 2018-11-25 MED ORDER — NAPROXEN 500 MG PO TABS: 500.0000 mg | ORAL_TABLET | Freq: Two times a day (BID) | ORAL | 0 refills | Status: DC

## 2018-11-25 MED ORDER — CHLORHEXIDINE GLUCONATE 4 % EX LIQD: Freq: Every day | CUTANEOUS | 0 refills | Status: DC | PRN

## 2018-11-25 MED ORDER — CEPHALEXIN 500 MG PO CAPS: 500.0000 mg | ORAL_CAPSULE | Freq: Three times a day (TID) | ORAL | 0 refills | Status: AC

## 2018-11-25 NOTE — ED Triage Notes (Signed)
Pt. Stated, Jessica Vasquez had these boils everywhere for months and they want go away. Mostly under my arms.

## 2018-11-25 NOTE — Discharge Instructions (Signed)
Use Naprosyn as directed.  Use Hibiclens as directed.  Take antibiotics as directed. Please take all of your antibiotics until finished.  As we discussed, follow-up with a general surgeon.  Return emergency department for any fevers, worsening redness or swelling around any of the wounds or any other worsening or concerning symptoms.

## 2018-11-25 NOTE — ED Provider Notes (Signed)
[facility]   CSN: [id number] Arrival date & time: 11/25/18  1631     History   Chief Complaint Chief Complaint  Patient presents with  . Abscess    HPI Timoteo Exposeerri Elsbernd is a 27 y.o. female has no history of hidradenitis separative who presents for evaluation of multiple abscesses to axilla, buttocks, thigh.  She reports that these abscesses been going on for several months.  She states that she will have them come to head and she will drain them.  She states that they have been irritating her more frequently, causing ED visit.  He states that none of the boils have gotten red or warm.  She states that they are painful.  She was post follow-up with dermatology but missed the appointment.  Patient reports she has not followed up with anybody about hidradenitis separative that.  Patient reports she is not had any fevers.  The history is provided by the patient.    Past Medical History:  Diagnosis Date  . Anemia   . Cholelithiasis   . Herpes    last outbreak two weeks ago ( mid march 2015)  . Hydradenitis     Patient Active Problem List   Diagnosis Date Noted  . Choledocholithiasis   . Cholelithiasis 10/14/2015  . NVD (normal vaginal delivery) 04/27/2014  . Indication for care in labor or delivery 04/26/2014  . Cholestasis of pregnancy 03/17/2014  . BV (bacterial vaginosis) 11/17/2013  . Supervision of other normal pregnancy 10/20/2013  . Anemia 10/20/2013  . 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 Chronic cholecystitis with calculus 12/29/2012    Past Surgical History:  Procedure Laterality Date  . CHOLECYSTECTOMY N/A 10/15/2015   Procedure: LAPAROSCOPIC CHOLECYSTECTOMY WITH INTRAOPERATIVE CHOLANGIOGRAM;  Surgeon: Manus RuddMatthew Tsuei, MD;  Location: [facility] OR;  Service: General;  Laterality: N/A;  . ERCP N/A 10/16/2015   Procedure: ENDOSCOPIC RETROGRADE CHOLANGIOPANCREATOGRAPHY (ERCP);  Surgeon: Iva Booparl E Gessner, MD;   [facility];  Service: Endoscopy;  Laterality: N/A;  . LAPAROSCOPIC CHOLECYSTECTOMY W/ CHOLANGIOGRAPHY    . nasal sugery       OB History    Gravida  2   Para  2   Term  2   Preterm  0   AB  0   Living  2     SAB  0   TAB  0   Ectopic  0   Multiple  0   Live Births  2            Home Medications    Prior to Admission medications   Medication Sig Start Date End Date Taking? Authorizing Provider  cephALEXin (KEFLEX) 500 MG capsule Take 1 capsule (500 mg total) by mouth 3 (three) times daily for 7 days. 11/25/18 12/02/18  Maxwell CaulLayden, Rhiley Solem A, PA-C  chlorhexidine (HIBICLENS) 4 % external liquid Apply topically daily as needed. 11/25/18   Maxwell CaulLayden, Emmilia Sowder A, PA-C  cyclobenzaprine (FLEXERIL) 10 MG tablet Take 1 tablet (10 mg total) by mouth 2 (two) times daily as needed for up to 12 doses for muscle spasms. 09/16/18   Jeannie FendMurphy, Laura A, PA-C  ibuprofen (ADVIL,MOTRIN) 800 MG tablet Take 1 tablet (800 mg total) by mouth 3 (three) times daily. 10/10/18   Gilda CreasePollina, Christopher J, MD  naproxen (NAPROSYN) 500 MG tablet Take 1 tablet (500 mg total) by mouth 2 (two) times daily. 11/25/18   Maxwell CaulLayden, Maguadalupe Lata A, PA-C  traMADol (ULTRAM) 50 MG tablet Take 1 tablet (50  mg total) by mouth every 6 (six) hours as needed. 10/10/18   Gilda Crease, MD    Family History Family History  Problem Relation Age of Onset  . Gallstones Mother     Social History Social History   Tobacco Use  . Smoking status: Former Smoker    Last attempt to quit: 05/16/2011    Years since quitting: 7.5  . Smokeless tobacco: Never Used  Substance Use Topics  . Alcohol use: No  . Drug use: No     Allergies   Patient has no known allergies.   Review of Systems Review of Systems  Constitutional: Negative for fever.  Skin: Positive for wound. Negative for color change.  All other systems reviewed and are negative.    Physical Exam Updated Vital Signs BP (!) 119/52 (BP Location:  Right Arm)   Pulse 80   Temp 98.3 F (36.8 C) (Oral)   Resp 17   Ht 5\' 10"  (1.778 m)   Wt 104.3 kg   LMP 11/10/2018   SpO2 99%   BMI 33.00 kg/m   Physical Exam  Constitutional: She appears well-developed and well-nourished.  HENT:  Head: Normocephalic and atraumatic.  Eyes: Conjunctivae and EOM are normal. Right eye exhibits no discharge. Left eye exhibits no discharge. No scleral icterus.  Pulmonary/Chest: Effort normal.  Neurological: She is alert.  Skin: Skin is warm and dry.  Scattered small papules noted to the left axilla.  Right axilla has about a 1.5 cm area of healed abscess with no active fluctuance, surrounding erythema, warmth.  He has a small hardened papule noted to the left buttock.  Several scattered hard papules noted to the inner medial thigh of the right lower extremity.  There is also areas of healed abscesses.  No surrounding warmth, erythema.  No active fluctuance.  Psychiatric: She has a normal mood and affect. Her speech is normal and behavior is normal.  Nursing note and vitals reviewed.    ED Treatments / Results  Labs (all labs ordered are listed, but only abnormal results are displayed) Labs Reviewed  POC URINE PREG, ED    EKG None  Radiology No results found.  Procedures Procedures (including critical care time)  Medications Ordered in ED Medications  HYDROcodone-acetaminophen (NORCO/VICODIN) 5-325 MG per tablet 1 tablet (1 tablet Oral Given 11/25/18 1737)     Initial Impression / Assessment and Plan / ED Course  I have reviewed the triage vital signs and the nursing notes.  Pertinent labs & imaging results that were available during my care of the patient were reviewed by me and considered in my medical decision making (see chart for details).     27 year old female with past progressive had managed supportively who presents for evaluation of abscesses.  She reports is been ongoing for several months and that they are causing her  pain and irritation.  He is sometimes able come to ahead and she will drain them.  No surrounding warmth, erythema.  No fevers.  She reports she was supposed to follow-up with dermatologist but has never seen them. Patient is afebrile, non-toxic appearing, sitting comfortably on examination table. Vital signs reviewed and stable.  On exam, she has several small scattered areas of healed abscesses.  No active fluctuance.  Nothing that is amenable to I&D here in the ED.  She has some small scattered papules noted to left axilla may be folliculitis rather than her hidradenitis.  I discussed with patient that there is no active abscess  that would require I&D here in ED.  We will plan to give outpatient referral to general surgery. Patient had ample opportunity for questions and discussion. All patient's questions were answered with full understanding. Strict return precautions discussed. Patient expresses understanding and agreement to plan.   Final Clinical Impressions(s) / ED Diagnoses   Final diagnoses:  Abscess    ED Discharge Orders         Ordered    chlorhexidine (HIBICLENS) 4 % external liquid  Daily PRN     11/25/18 1732    cephALEXin (KEFLEX) 500 MG capsule  3 times daily     11/25/18 1732    naproxen (NAPROSYN) 500 MG tablet  2 times daily     11/25/18 1733           Rosana Hoes 11/25/18 2249    Mancel Bale, MD 11/27/18 1406

## 2019-01-14 ENCOUNTER — Ambulatory Visit (HOSPITAL_COMMUNITY)
Admission: EM | Admit: 2019-01-14 | Discharge: 2019-01-14 | Disposition: A | Discharge status: Home / Self Care | Payer: Medicaid Other | Attending: Family Medicine | Admitting: Family Medicine

## 2019-01-14 ENCOUNTER — Encounter (HOSPITAL_COMMUNITY): Payer: Self-pay

## 2019-01-14 DIAGNOSIS — N898 Other specified noninflammatory disorders of vagina: Secondary | ICD-10-CM | POA: Insufficient documentation

## 2019-01-14 DIAGNOSIS — A599 Trichomoniasis, unspecified: Secondary | ICD-10-CM

## 2019-01-14 DIAGNOSIS — L732 Hidradenitis suppurativa: Secondary | ICD-10-CM | POA: Insufficient documentation

## 2019-01-14 DIAGNOSIS — Z113 Encounter for screening for infections with a predominantly sexual mode of transmission: Secondary | ICD-10-CM | POA: Diagnosis not present

## 2019-01-14 DIAGNOSIS — Z202 Contact with and (suspected) exposure to infections with a predominantly sexual mode of transmission: Secondary | ICD-10-CM | POA: Insufficient documentation

## 2019-01-14 DIAGNOSIS — L02411 Cutaneous abscess of right axilla: Secondary | ICD-10-CM

## 2019-01-14 HISTORY — DX: Trichomoniasis, unspecified: A59.9

## 2019-01-14 MED ORDER — AZITHROMYCIN 250 MG PO TABS
1000.0000 mg | ORAL_TABLET | Freq: Once | ORAL | Status: AC
  Administered 2019-01-14: 1000 mg via ORAL

## 2019-01-14 MED ORDER — FLUCONAZOLE 200 MG PO TABS: ORAL_TABLET | ORAL | 0 refills | Status: DC

## 2019-01-14 MED ORDER — CEFTRIAXONE SODIUM 250 MG IJ SOLR
250.0000 mg | Freq: Once | INTRAMUSCULAR | Status: AC
  Administered 2019-01-14: 250 mg via INTRAMUSCULAR

## 2019-01-14 MED ORDER — DOXYCYCLINE HYCLATE 100 MG PO CAPS: 100.0000 mg | ORAL_CAPSULE | Freq: Two times a day (BID) | ORAL | 0 refills | Status: DC

## 2019-01-14 MED ORDER — METRONIDAZOLE 500 MG PO TABS: 500.0000 mg | ORAL_TABLET | Freq: Two times a day (BID) | ORAL | 0 refills | Status: DC

## 2019-01-14 MED ORDER — CEFTRIAXONE SODIUM 250 MG IJ SOLR
INTRAMUSCULAR | Status: AC
  Filled 2019-01-14: qty 250

## 2019-01-14 MED ORDER — AZITHROMYCIN 250 MG PO TABS
ORAL_TABLET | ORAL | Status: AC
  Filled 2019-01-14: qty 4

## 2019-01-14 NOTE — Discharge Instructions (Signed)
Vaginal discharge: Vaginal self-swab obtained Given rocephin 250mg  injection and azithromycin 1g in office HIV/ syphilis testing today Prescribed metronidazole 500 mg twice daily for 7 days (do not take while consuming alcohol and/or if breastfeeding) Prescribed diflucan 200 mg once daily and then second dose 72 hours later Take medications as prescribed and to completion We will follow up with you regarding the results of your test If tests are positive, please abstain from sexual activity for at least 7 days and notify partners Follow up with PCP if symptoms persists Return here or go to ER if you have any new or worsening symptoms fever, chills, nausea, vomiting, abdominal or pelvic pain, painful intercourse, vaginal discharge, vaginal bleeding, persistent symptoms despite treatment, etc...  HS:  Apply warm compresses 3-4x daily for 10-15 minutes Wash site daily with warm water and mild soap Keep covered to avoid friction Take antibiotic as prescribed and to completion Follow up here or with PCP if symptoms persists Return or go to the ED if you have any new or worsening symptoms increased pain, swelling, redness, etc..

## 2019-01-14 NOTE — ED Provider Notes (Signed)
[facility]   [id number] 01/14/19 Arrival Time: 1523   WU:[id number]:VAGINAL DISCHARGE  SUBJECTIVE:  Timoteo Exposeerri Daffin is a 28 y.o. female who presents with complaints of thick white vaginal discharge, vaginal itching, and odor x 3 days.  Admits to unprotected sex with new partner around the time of symptoms.  She has tried OTC medications for yeast without relief.  She reports worsening symptoms with itching.  She reports similar symptoms in the past and was diagnosed with yeast infection.  She denies fever, chills, nausea, vomiting, abdominal or pelvic pain, urinary symptoms, vaginal bleeding, dyspareunia, vaginal rashes or lesions.   Patient's last menstrual period was 12/21/2018. Current birth control method: None  Pt also requests "pill" for hidradenitis suppurativa flare x 1-2 months. Currently has two abscesses on right and left axilla. States they have not come to a "head" yet.  Reports similar symptoms in the past that improved with medication.  Denies erythema, swelling, discharge or bleeding.      ROS: As per HPI.  Past Medical History:  Diagnosis Date  . Anemia   . Cholelithiasis   . Herpes    last outbreak two weeks ago ( mid march 2015)  . Hydradenitis    Past Surgical History:  Procedure Laterality Date  . CHOLECYSTECTOMY N/A 10/15/2015   Procedure: LAPAROSCOPIC CHOLECYSTECTOMY WITH INTRAOPERATIVE CHOLANGIOGRAM;  Surgeon: Manus RuddMatthew Tsuei, MD;  Location: [facility] OR;  Service: General;  Laterality: N/A;  . ERCP N/A 10/16/2015   Procedure: ENDOSCOPIC RETROGRADE CHOLANGIOPANCREATOGRAPHY (ERCP);  Surgeon: Iva Booparl E Gessner, MD;  [facility];  Service: Endoscopy;  Laterality: N/A;  . LAPAROSCOPIC CHOLECYSTECTOMY W/ CHOLANGIOGRAPHY    . nasal sugery     No Known Allergies No current facility-administered medications on file prior to encounter.    No current outpatient medications on file prior to encounter.    Social History   Socioeconomic History  . Marital status:  Single    Spouse name: Not on file  . Number of children: Not on file  . Years of education: Not on file  . Highest education level: Not on file  Occupational History  . Not on file  Social Needs  . Financial resource strain: Not on file  . Food insecurity:    Worry: Not on file    Inability: Not on file  . Transportation needs:    Medical: Not on file    Non-medical: Not on file  Tobacco Use  . Smoking status: Former Smoker    Last attempt to quit: 05/16/2011    Years since quitting: 7.6  . Smokeless tobacco: Never Used  Substance and Sexual Activity  . Alcohol use: No  . Drug use: No  . Sexual activity: Not on file    Comment: Pt states was on bc pill and stopped prior to pregnancy  Lifestyle  . Physical activity:    Days per week: Not on file    Minutes per session: Not on file  . Stress: Not on file  Relationships  . Social connections:    Talks on phone: Not on file    Gets together: Not on file    Attends religious service: Not on file    Active member of club or organization: Not on file    Attends meetings of clubs or organizations: Not on file    Relationship status: Not on file  . Intimate partner violence:    Fear of current or ex partner: Not on file    Emotionally abused: Not on  file    Physically abused: Not on file    Forced sexual activity: Not on file  Other Topics Concern  . Not on file  Social History Narrative  . Not on file   Family History  Problem Relation Age of Onset  . Gallstones Mother     OBJECTIVE:  Vitals:   01/14/19 1619  BP: 133/71  Pulse: 80  Resp: 20  Temp: 98.1 F (36.7 C)  TempSrc: Oral  SpO2: 100%     General appearance: Alert, NAD, appears stated age Head: NCAT Throat: lips, mucosa, and tongue normal; teeth and gums normal Lungs: CTA bilaterally without adventitious breath sounds Heart: regular rate and rhythm.  Radial pulses 2+ symmetrical bilaterally Back: no CVA tenderness Abdomen: soft, non-tender; bowel  sounds normal; no guarding GU: deferred Skin: warm and dry; approximately 1x2 cm area of induration to left axilla without obvious drainage or bleeding; no fluctuance; mildly TTP Psychological:  Alert and cooperative. Normal mood and affect.  LABS:  Results for orders placed or performed during the hospital encounter of 11/25/18  POC Urine Pregnancy, ED ([facility] NOT [facility])  Result Value Ref Range   Preg Test, Ur NEGATIVE NEGATIVE    Labs Reviewed  RPR  HIV ANTIBODY (ROUTINE TESTING W REFLEX)  POC URINE PREG, ED  CERVICOVAGINAL ANCILLARY ONLY    ASSESSMENT & PLAN:  1. Possible exposure to STD   2. Vaginal discharge   3. Vaginal itching   4. Vaginal odor   5. Hidradenitis suppurativa of left axilla     Meds ordered this encounter  Medications  . cefTRIAXone (ROCEPHIN) injection 250 mg    Order Specific Question:   Antibiotic Indication:    Answer:   STD  . azithromycin (ZITHROMAX) tablet 1,000 mg  . metroNIDAZOLE (FLAGYL) 500 MG tablet    Sig: Take 1 tablet (500 mg total) by mouth 2 (two) times daily.    Dispense:  14 tablet    Refill:  0    Order Specific Question:   Supervising Provider    Answer:   Eustace MooreNELSON, YVONNE SUE [[id number]]  . fluconazole (DIFLUCAN) 200 MG tablet    Sig: Take one dose by mouth, wait 72 hours, and then take second dose by mouth    Dispense:  2 tablet    Refill:  0    Order Specific Question:   Supervising Provider    Answer:   Eustace MooreNELSON, YVONNE SUE [[id number]]  . doxycycline (VIBRAMYCIN) 100 MG capsule    Sig: Take 1 capsule (100 mg total) by mouth 2 (two) times daily.    Dispense:  20 capsule    Refill:  0    Order Specific Question:   Supervising Provider    Answer:   Eustace MooreELSON, YVONNE SUE [[id number]]    Pending: Labs Reviewed  RPR  HIV ANTIBODY (ROUTINE TESTING W REFLEX)  POC URINE PREG, ED  CERVICOVAGINAL ANCILLARY ONLY   Vaginal discharge: Vaginal self-swab obtained Given rocephin 250mg  injection and azithromycin 1g in office HIV/  syphilis testing today Prescribed metronidazole 500 mg twice daily for 7 days (do not take while consuming alcohol and/or if breastfeeding) Prescribed diflucan 200 mg once daily and then second dose 72 hours later Take medications as prescribed and to completion We will follow up with you regarding the results of your test If tests are positive, please abstain from sexual activity for at least 7 days and notify partners Follow up with PCP if symptoms persists Return here or go to  ER if you have any new or worsening symptoms fever, chills, nausea, vomiting, abdominal or pelvic pain, painful intercourse, vaginal discharge, vaginal bleeding, persistent symptoms despite treatment, etc...  HS:  Apply warm compresses 3-4x daily for 10-15 minutes Wash site daily with warm water and mild soap Keep covered to avoid friction Take antibiotic as prescribed and to completion Follow up here or with PCP if symptoms persists Return or go to the ED if you have any new or worsening symptoms increased pain, swelling, redness, etc..  Reviewed expectations re: course of current medical issues. Questions answered. Outlined signs and symptoms indicating need for more acute intervention. Patient verbalized understanding. After Visit Summary given.       Rennis Harding, PA-C 01/14/19 1743

## 2019-01-14 NOTE — ED Triage Notes (Signed)
Pt presents to have STD Testing; Pt states she is having itchiness and foul odor in vaginal area.

## 2019-01-15 LAB — CERVICOVAGINAL ANCILLARY ONLY
Bacterial vaginitis: NEGATIVE
Candida vaginitis: POSITIVE — AB
Chlamydia: NEGATIVE
Neisseria Gonorrhea: NEGATIVE
TRICH (WINDOWPATH): POSITIVE — AB

## 2019-01-15 LAB — RPR: RPR Ser Ql: NONREACTIVE

## 2019-01-15 LAB — HIV ANTIBODY (ROUTINE TESTING W REFLEX): HIV Screen 4th Generation wRfx: NONREACTIVE

## 2019-01-16 ENCOUNTER — Telehealth (HOSPITAL_COMMUNITY): Payer: Self-pay | Admitting: Emergency Medicine

## 2019-01-16 NOTE — Telephone Encounter (Signed)
Candida (yeast) is positive.  Prescription for fluconazole was given at the urgent care visit.    Trichomonas is positive. Rx metronidazole was given at the urgent care visit. Pt needs education to please refrain from sexual intercourse for 7 days to give the medicine time to work. Sexual partners need to be notified and tested/treated. Condoms may reduce risk of reinfection. Recheck for further evaluation if symptoms are not improving.   Patient contacted and made aware of all results, all questions answered.

## 2019-01-28 ENCOUNTER — Ambulatory Visit: Payer: Medicaid Other

## 2019-01-30 ENCOUNTER — Other Ambulatory Visit: Payer: Self-pay

## 2019-01-30 ENCOUNTER — Encounter (HOSPITAL_COMMUNITY): Payer: Self-pay | Admitting: *Deleted

## 2019-01-30 ENCOUNTER — Inpatient Hospital Stay (HOSPITAL_COMMUNITY)
Admission: AD | Admit: 2019-01-30 | Discharge: 2019-01-30 | Disposition: A | Discharge status: Home / Self Care | Payer: BC Managed Care – PPO | Attending: Obstetrics & Gynecology | Admitting: Obstetrics & Gynecology

## 2019-01-30 ENCOUNTER — Inpatient Hospital Stay (HOSPITAL_COMMUNITY): Payer: BC Managed Care – PPO

## 2019-01-30 DIAGNOSIS — O209 Hemorrhage in early pregnancy, unspecified: Secondary | ICD-10-CM | POA: Diagnosis not present

## 2019-01-30 DIAGNOSIS — R109 Unspecified abdominal pain: Secondary | ICD-10-CM

## 2019-01-30 DIAGNOSIS — O26891 Other specified pregnancy related conditions, first trimester: Secondary | ICD-10-CM

## 2019-01-30 DIAGNOSIS — Z87891 Personal history of nicotine dependence: Secondary | ICD-10-CM | POA: Insufficient documentation

## 2019-01-30 DIAGNOSIS — Z3A01 Less than 8 weeks gestation of pregnancy: Secondary | ICD-10-CM | POA: Diagnosis not present

## 2019-01-30 DIAGNOSIS — N939 Abnormal uterine and vaginal bleeding, unspecified: Secondary | ICD-10-CM

## 2019-01-30 DIAGNOSIS — Z3490 Encounter for supervision of normal pregnancy, unspecified, unspecified trimester: Secondary | ICD-10-CM

## 2019-01-30 HISTORY — DX: Trichomoniasis, unspecified: A59.9

## 2019-01-30 LAB — WET PREP, GENITAL
Clue Cells Wet Prep HPF POC: NONE SEEN
Sperm: NONE SEEN
Trich, Wet Prep: NONE SEEN
Yeast Wet Prep HPF POC: NONE SEEN

## 2019-01-30 LAB — CBC WITH DIFFERENTIAL/PLATELET
BASOS PCT: 0 %
Basophils Absolute: 0 10*3/uL (ref 0.0–0.1)
EOS ABS: 0.3 10*3/uL (ref 0.0–0.5)
Eosinophils Relative: 3 %
HCT: 35.5 % — ABNORMAL LOW (ref 36.0–46.0)
Hemoglobin: 11.6 g/dL — ABNORMAL LOW (ref 12.0–15.0)
Lymphocytes Relative: 35 %
Lymphs Abs: 3.2 10*3/uL (ref 0.7–4.0)
MCH: 28.9 pg (ref 26.0–34.0)
MCHC: 32.7 g/dL (ref 30.0–36.0)
MCV: 88.5 fL (ref 80.0–100.0)
Monocytes Absolute: 0.4 10*3/uL (ref 0.1–1.0)
Monocytes Relative: 4 %
Neutro Abs: 5.2 10*3/uL (ref 1.7–7.7)
Neutrophils Relative %: 58 %
Platelets: 249 10*3/uL (ref 150–400)
RBC: 4.01 MIL/uL (ref 3.87–5.11)
RDW: 13.4 % (ref 11.5–15.5)
WBC: 9 10*3/uL (ref 4.0–10.5)
nRBC: 0 % (ref 0.0–0.2)

## 2019-01-30 LAB — URINALYSIS, ROUTINE W REFLEX MICROSCOPIC
Bilirubin Urine: NEGATIVE
Glucose, UA: NEGATIVE mg/dL
Hgb urine dipstick: NEGATIVE
Ketones, ur: NEGATIVE mg/dL
Nitrite: NEGATIVE
PH: 7 (ref 5.0–8.0)
Protein, ur: NEGATIVE mg/dL
Specific Gravity, Urine: 1.01 (ref 1.005–1.030)

## 2019-01-30 LAB — HCG, QUANTITATIVE, PREGNANCY: HCG, BETA CHAIN, QUANT, S: 7890 m[IU]/mL — AB (ref <5)

## 2019-01-30 LAB — URINALYSIS, MICROSCOPIC (REFLEX): RBC / HPF: NONE SEEN RBC/hpf (ref 0–5)

## 2019-01-30 LAB — ABO/RH: ABO/RH(D): AB POS

## 2019-01-30 LAB — POCT PREGNANCY, URINE: Preg Test, Ur: POSITIVE — AB

## 2019-01-30 MED ORDER — PREPLUS 27-1 MG PO TABS: 1.0000 | ORAL_TABLET | Freq: Every day | ORAL | 13 refills | Status: DC

## 2019-01-30 NOTE — MAU Provider Note (Signed)
History     CSN: [id number]  Arrival date and time: 01/30/19 1444   First Provider Initiated Contact with Patient 01/30/19 1612      Chief Complaint  Patient presents with  . Abdominal Pain  . Vaginal Bleeding   HPI Jessica Vasquez is a 28 y.o. G3P2002 at [redacted]w[redacted]d by LMP who presents to MAU with chief complaints of abdominal pain and vaginal bleeding. Patient endorses LMP of 12/22/2018. She took Plan B 01/14/2019. She has had a positive HPT within the past week. Her bleeding and cramping started three days ago. Her pain is mild, resolves with Tylenol, and does not radiate. Her bleeding is light, waxes and wanes throughout the day. She denies abnormal vaginal discharge, urinary complaints, SOB, lightheadedness, fever or recent illness.  OB History    Gravida  3   Para  2   Term  2   Preterm  0   AB  0   Living  2     SAB  0   TAB  0   Ectopic  0   Multiple  0   Live Births  2           Past Medical History:  Diagnosis Date  . Anemia   . Cholelithiasis   . Herpes    last outbreak two weeks ago ( mid march 2015)  . Hydradenitis   . Trichimoniasis 01/14/2019    Past Surgical History:  Procedure Laterality Date  . CHOLECYSTECTOMY N/A 10/15/2015   Procedure: LAPAROSCOPIC CHOLECYSTECTOMY WITH INTRAOPERATIVE CHOLANGIOGRAM;  Surgeon: Manus Rudd, MD;  Location: [facility] OR;  Service: General;  Laterality: N/A;  . ERCP N/A 10/16/2015   Procedure: ENDOSCOPIC RETROGRADE CHOLANGIOPANCREATOGRAPHY (ERCP);  Surgeon: Iva Boop, MD;  Location: [facility] ENDOSCOPY;  Service: Endoscopy;  Laterality: N/A;  . LAPAROSCOPIC CHOLECYSTECTOMY W/ CHOLANGIOGRAPHY    . nasal sugery      Family History  Problem Relation Age of Onset  . Gallstones Mother     Social History   Tobacco Use  . Smoking status: Former Smoker    Last attempt to quit: 05/16/2011    Years since quitting: 7.7  . Smokeless tobacco: Never Used  Substance Use Topics  . Alcohol use: No  . Drug use: No     Allergies: No Known Allergies  Medications Prior to Admission  Medication Sig Dispense Refill Last Dose  . doxycycline (VIBRAMYCIN) 100 MG capsule Take 1 capsule (100 mg total) by mouth 2 (two) times daily. 20 capsule 0   . fluconazole (DIFLUCAN) 200 MG tablet Take one dose by mouth, wait 72 hours, and then take second dose by mouth 2 tablet 0   . metroNIDAZOLE (FLAGYL) 500 MG tablet Take 1 tablet (500 mg total) by mouth 2 (two) times daily. 14 tablet 0     Review of Systems  Constitutional: Negative for chills, fatigue and fever.  Respiratory: Negative for shortness of breath.   Gastrointestinal: Positive for abdominal pain. Negative for nausea and vomiting.  Genitourinary: Positive for vaginal bleeding. Negative for difficulty urinating and dysuria.  Musculoskeletal: Negative for back pain.  Neurological: Negative for dizziness, syncope, weakness and headaches.  All other systems reviewed and are negative.  Physical Exam   Blood pressure 126/70, pulse 97, temperature 97.9 F (36.6 C), temperature source Oral, resp. rate 18, height 5\' 10"  (1.778 m), weight 110.3 kg, last menstrual period 12/22/2018.  Physical Exam  Nursing note and vitals reviewed. Constitutional: She is oriented to person, place, and time. She appears  well-developed and well-nourished.  Cardiovascular: Normal rate.  Respiratory: Effort normal.  GI: Soft. Bowel sounds are normal. She exhibits no distension. There is no abdominal tenderness. There is no rebound, no guarding and no CVA tenderness.  Musculoskeletal: Normal range of motion.  Neurological: She is alert and oriented to person, place, and time.  Skin: Skin is warm and dry.  Psychiatric: She has a normal mood and affect. Her behavior is normal. Judgment and thought content normal.    MAU Course/MDM  Procedures  --No bleeding noted on swab collection --No concerning findings on physical exam  Patient Vitals for the past 24 hrs:  BP Temp Temp  src Pulse Resp Height Weight  01/30/19 1734 (!) 122/51 - - 90 - - -  01/30/19 1732 - - - - 16 - -  01/30/19 1455 126/70 97.9 F (36.6 C) Oral 97 18 5\' 10"  (1.778 m) 110.3 kg    Results for orders placed or performed during the hospital encounter of 01/30/19 (from the past 24 hour(s))  Urinalysis, Routine w reflex microscopic     Status: Abnormal   Collection Time: 01/30/19  3:01 PM  Result Value Ref Range   Color, Urine YELLOW YELLOW   APPearance CLEAR CLEAR   Specific Gravity, Urine 1.010 1.005 - 1.030   pH 7.0 5.0 - 8.0   Glucose, UA NEGATIVE NEGATIVE mg/dL   Hgb urine dipstick NEGATIVE NEGATIVE   Bilirubin Urine NEGATIVE NEGATIVE   Ketones, ur NEGATIVE NEGATIVE mg/dL   Protein, ur NEGATIVE NEGATIVE mg/dL   Nitrite NEGATIVE NEGATIVE   Leukocytes,Ua SMALL (A) NEGATIVE  Urinalysis, Microscopic (reflex)     Status: Abnormal   Collection Time: 01/30/19  3:01 PM  Result Value Ref Range   RBC / HPF NONE SEEN 0 - 5 RBC/hpf   WBC, UA 0-5 0 - 5 WBC/hpf   Bacteria, UA RARE (A) NONE SEEN   Squamous Epithelial / LPF 6-10 0 - 5  Pregnancy, urine POC     Status: Abnormal   Collection Time: 01/30/19  3:03 PM  Result Value Ref Range   Preg Test, Ur POSITIVE (A) NEGATIVE  Wet prep, genital     Status: Abnormal   Collection Time: 01/30/19  4:21 PM  Result Value Ref Range   Yeast Wet Prep HPF POC NONE SEEN NONE SEEN   Trich, Wet Prep NONE SEEN NONE SEEN   Clue Cells Wet Prep HPF POC NONE SEEN NONE SEEN   WBC, Wet Prep HPF POC MANY (A) NONE SEEN   Sperm NONE SEEN   CBC with Differential/Platelet     Status: Abnormal   Collection Time: 01/30/19  4:27 PM  Result Value Ref Range   WBC 9.0 4.0 - 10.5 K/uL   RBC 4.01 3.87 - 5.11 MIL/uL   Hemoglobin 11.6 (L) 12.0 - 15.0 g/dL   HCT 07.6 (L) 80.8 - 81.1 %   MCV 88.5 80.0 - 100.0 fL   MCH 28.9 26.0 - 34.0 pg   MCHC 32.7 30.0 - 36.0 g/dL   RDW 03.1 59.4 - 58.5 %   Platelets 249 150 - 400 K/uL   nRBC 0.0 0.0 - 0.2 %   Neutrophils Relative %  58 %   Neutro Abs 5.2 1.7 - 7.7 K/uL   Lymphocytes Relative 35 %   Lymphs Abs 3.2 0.7 - 4.0 K/uL   Monocytes Relative 4 %   Monocytes Absolute 0.4 0.1 - 1.0 K/uL   Eosinophils Relative 3 %   Eosinophils Absolute 0.3  0.0 - 0.5 K/uL   Basophils Relative 0 %   Basophils Absolute 0.0 0.0 - 0.1 K/uL  hCG, quantitative, pregnancy     Status: Abnormal   Collection Time: 01/30/19  4:27 PM  Result Value Ref Range   hCG, Beta Chain, Quant, S 7,890 (H) <5 mIU/mL  ABO/Rh     Status: None (Preliminary result)   Collection Time: 01/30/19  4:27 PM  Result Value Ref Range   ABO/RH(D)      AB POS Performed at [facility], [address] [place], [place] [address]     [place] Ob Less Than 14 Weeks With Ob Transvaginal  Result Date: 01/30/2019 CLINICAL DATA:  Abdominal pain in first trimester of pregnancy, vaginal bleeding EXAM: OBSTETRIC <14 WK US AND TRANSVAGINAL OB US TECHNIQUE: Both transabdominal and transvaginal ultrasound examinations were performed for complete evaluation of the gestation as well as the maternal uterus, adnexal regions, and pelvic cul-de-sac. Transvaginal technique was performed to assess early pregnancy. COMPARISON:  None for this gestation FINDINGS: Intrauterine gestational sac: Present, single Yolk sac:  Present Embryo:  Not visualized Cardiac Activity: N/A Heart Rate: N/A  bpm MSD: 8.1 mm   5 w   4 d Subchorionic hemorrhage:  None visualized. Maternal uterus/adnexae: Uterus otherwise unremarkable. LEFT ovary normal size and morphology, 2.2 x 3.7 x 2.7 cm. RIGHT ovary normal size and morphology, 2.0 x 3.1 x 2.0 cm. No free pelvic fluid or adnexal masses. IMPRESSION: Gestational sac containing a yolk sac identified within the uterus as above. No fetal pole identified to establish viability. Electronically Signed   By: Ulyses SouthwardMark  Boles M.D.   On: 01/30/2019 17:34   Assessment and Plan  --28 y.o. [id number] with SIUP at 1666w4d  --First trimester precautions reviewed with patient --Rx  prenatal vitamins to patient pharmacy --Discharge home in stable condition  F/U: Patient considering termination. If pregnancy continues, recommend initiating PNC around 11 weeks  Calvert CantorSamantha C Amaryah Mallen, [place] 01/30/2019, 5:54 PM

## 2019-01-30 NOTE — Discharge Instructions (Signed)

## 2019-01-30 NOTE — MAU Note (Addendum)
Pt C/O spotting & cramping x 3 days, LMP was Jan 6.  Didn't have a period in December, period in January was lighter than usual.

## 2019-02-02 LAB — GC/CHLAMYDIA PROBE AMP (~~LOC~~) NOT AT ARMC
CHLAMYDIA, DNA PROBE: NEGATIVE
Neisseria Gonorrhea: NEGATIVE

## 2019-02-25 ENCOUNTER — Other Ambulatory Visit: Payer: Self-pay | Admitting: Family Medicine

## 2019-02-25 ENCOUNTER — Ambulatory Visit: Payer: Self-pay

## 2019-02-25 DIAGNOSIS — M25572 Pain in left ankle and joints of left foot: Secondary | ICD-10-CM

## 2019-04-26 ENCOUNTER — Inpatient Hospital Stay (HOSPITAL_COMMUNITY)
Admission: AD | Admit: 2019-04-26 | Discharge: 2019-04-26 | Disposition: A | Discharge status: Home / Self Care | Payer: BC Managed Care – PPO | Attending: Obstetrics and Gynecology | Admitting: Obstetrics and Gynecology

## 2019-04-26 ENCOUNTER — Other Ambulatory Visit: Payer: Self-pay

## 2019-04-26 ENCOUNTER — Inpatient Hospital Stay (HOSPITAL_COMMUNITY): Payer: BC Managed Care – PPO

## 2019-04-26 ENCOUNTER — Encounter (HOSPITAL_COMMUNITY): Payer: Self-pay

## 2019-04-26 DIAGNOSIS — O208 Other hemorrhage in early pregnancy: Secondary | ICD-10-CM | POA: Insufficient documentation

## 2019-04-26 DIAGNOSIS — O209 Hemorrhage in early pregnancy, unspecified: Secondary | ICD-10-CM

## 2019-04-26 DIAGNOSIS — Z3490 Encounter for supervision of normal pregnancy, unspecified, unspecified trimester: Secondary | ICD-10-CM

## 2019-04-26 DIAGNOSIS — Z3A01 Less than 8 weeks gestation of pregnancy: Secondary | ICD-10-CM

## 2019-04-26 DIAGNOSIS — O360910 Maternal care for other rhesus isoimmunization, first trimester, not applicable or unspecified: Secondary | ICD-10-CM

## 2019-04-26 DIAGNOSIS — Z87891 Personal history of nicotine dependence: Secondary | ICD-10-CM | POA: Diagnosis not present

## 2019-04-26 DIAGNOSIS — Z6730 Type AB blood, Rh positive: Secondary | ICD-10-CM

## 2019-04-26 LAB — HCG, QUANTITATIVE, PREGNANCY: hCG, Beta Chain, Quant, S: 11399 m[IU]/mL — ABNORMAL HIGH (ref <5)

## 2019-04-26 LAB — POCT PREGNANCY, URINE: Preg Test, Ur: POSITIVE — AB

## 2019-04-26 LAB — WET PREP, GENITAL
Clue Cells Wet Prep HPF POC: NONE SEEN
Sperm: NONE SEEN
Trich, Wet Prep: NONE SEEN

## 2019-04-26 NOTE — MAU Provider Note (Signed)
History     CSN: [id number]  Arrival date and time: 04/26/19 1836   First Provider Initiated Contact with Patient 04/26/19 1919      Chief Complaint  Patient presents with  . Vaginal Bleeding   Dierdre Calton is a 28 y.o. X3G1829 at Unknown who presents with spotting since this Friday. She denies any pain at this time. She has not initiated prenatal care. Patient reports SAB of pregnancy from 01/2019. So this is a new pregnancy although patient did not have FU after SAB.   Vaginal Bleeding  The patient's primary symptoms include vaginal bleeding. The patient's pertinent negatives include no pelvic pain or vaginal discharge. This is a new problem. Episode onset: 04/24/2019. The problem occurs intermittently. The problem has been unchanged. The patient is experiencing no pain. She is pregnant. Pertinent negatives include no chills, dysuria, fever, frequency, nausea or vomiting. The vaginal discharge was bloody. The vaginal bleeding is spotting (pink spotting in underwear, not requiring a pad. ). She has not been passing clots. She has not been passing tissue. Nothing aggravates the symptoms. She has tried nothing for the symptoms. Sexual activity: denies any intercourse in the last 24 hours.  She uses nothing for contraception. Her menstrual history has been regular (approx the end of March ).    OB History    Gravida  4   Para  2   Term  2   Preterm  0   AB  1   Living  2     SAB  1   TAB  0   Ectopic  0   Multiple  0   Live Births  2           Past Medical History:  Diagnosis Date  . Anemia   . Cholelithiasis   . Herpes    last outbreak two weeks ago ( mid march 2015)  . Hydradenitis   . Trichimoniasis 01/14/2019    Past Surgical History:  Procedure Laterality Date  . CHOLECYSTECTOMY N/A 10/15/2015   Procedure: LAPAROSCOPIC CHOLECYSTECTOMY WITH INTRAOPERATIVE CHOLANGIOGRAM;  Surgeon: Manus Rudd, MD;  Location: [facility] OR;  Service: General;  Laterality: N/A;  .  ERCP N/A 10/16/2015   Procedure: ENDOSCOPIC RETROGRADE CHOLANGIOPANCREATOGRAPHY (ERCP);  Surgeon: Iva Boop, MD;  Location: [facility] ENDOSCOPY;  Service: Endoscopy;  Laterality: N/A;  . LAPAROSCOPIC CHOLECYSTECTOMY W/ CHOLANGIOGRAPHY    . nasal sugery      Family History  Problem Relation Age of Onset  . Gallstones Mother     Social History   Tobacco Use  . Smoking status: Former Smoker    Last attempt to quit: 05/16/2011    Years since quitting: 7.9  . Smokeless tobacco: Never Used  Substance Use Topics  . Alcohol use: No  . Drug use: No    Allergies: No Known Allergies  Medications Prior to Admission  Medication Sig Dispense Refill Last Dose  . Prenatal Vit-Fe Fumarate-FA (PREPLUS) 27-1 MG TABS Take 1 tablet by mouth daily. 30 tablet 13 More than a month at Unknown time    Review of Systems  Constitutional: Negative for chills and fever.  Gastrointestinal: Negative for nausea and vomiting.  Genitourinary: Positive for vaginal bleeding. Negative for dysuria, frequency, pelvic pain and vaginal discharge.   Physical Exam   Blood pressure 131/72, pulse (!) 102, temperature 98.3 F (36.8 C), SpO2 100 %, currently breastfeeding.  Physical Exam  Nursing note and vitals reviewed. Constitutional: She is oriented to person, place, and time. She appears  well-developed and well-nourished. No distress.  HENT:  Head: Normocephalic.  Cardiovascular: Normal rate.  Respiratory: Effort normal.  GI: Soft. There is no abdominal tenderness. There is no rebound.  Neurological: She is alert and oriented to person, place, and time.  Skin: Skin is warm and dry.  Psychiatric: She has a normal mood and affect.   Results for orders placed or performed during the hospital encounter of 04/26/19 (from the past 24 hour(s))  Pregnancy, urine POC     Status: Abnormal   Collection Time: 04/26/19  6:53 PM  Result Value Ref Range   Preg Test, Ur POSITIVE (A) NEGATIVE  Wet prep, genital      Status: Abnormal   Collection Time: 04/26/19  7:15 PM  Result Value Ref Range   Yeast Wet Prep HPF POC (A) NONE SEEN    Swab received with less than 0.5 mL of saline, saline added to specimen, interpret results with caution.   Trich, Wet Prep NONE SEEN NONE SEEN   Clue Cells Wet Prep HPF POC NONE SEEN NONE SEEN   WBC, Wet Prep HPF POC MODERATE (A) NONE SEEN   Sperm NONE SEEN    [place] Ob Less Than 14 Weeks With Ob Transvaginal  Result Date: 04/26/2019 CLINICAL DATA:  28 year old female with positive pregnancy test presenting with vaginal spotting. LMP: 03/11/2019 corresponding to an estimated gestational age of [redacted] weeks, 4 days. EXAM: OBSTETRIC <14 WK US AND TRANSVAGINAL OB US TECHNIQUE: Both transabdominal and transvaginal ultrasound examinations were performed for complete evaluation of the gestation as well as the maternal uterus, adnexal regions, and pelvic cul-de-sac. Transvaginal technique was performed to assess early pregnancy. COMPARISON:  None. FINDINGS: Intrauterine gestational sac: Single intrauterine gestational sac. Yolk sac:  Seen Embryo:  Not present MSD: 11 mm   5 w   5 d Subchorionic hemorrhage:  Small subchorionic hemorrhage. Maternal uterus/adnexae: The ovaries are unremarkable. There is a corpus luteum in the right ovary. Small free fluid within the pelvis. IMPRESSION: Single intrauterine gestational sac with an estimated gestational age of [redacted] weeks, 5 days. No fetal pole identified on today's exam. Follow-up with ultrasound in 7-11 days, or earlier if clinically indicated, recommended. Electronically Signed   By: Elgie CollardArash  Radparvar M.D.   On: 04/26/2019 20:25    MAU Course  Procedures  MDM   Assessment and Plan   1. Intrauterine pregnancy   2. Vaginal bleeding in pregnancy, first trimester   3. Type AB blood, Rh positive    DC home Comfort measures reviewed  1stTrimester precautions  Bleeding precautions RX: none  Return to MAU as needed Start prenatal care  FU US to  confirm viability ordered    Thressa ShellerHeather Sameer Teeple DNP, CNM  04/26/19  8:48 PM

## 2019-04-26 NOTE — MAU Note (Signed)
Pt reports pos hpt. Pt states she started spotting on Friday.  Pt also complains of cramping in lower abd.

## 2019-04-26 NOTE — Discharge Instructions (Signed)
First Trimester of Pregnancy  The first trimester of pregnancy is from week 1 until the end of week 13 (months 1 through 3). A week after a sperm fertilizes an egg, the egg will implant on the wall of the uterus. This embryo will begin to develop into a baby. Genes from you and your partner will form the baby. The female genes will determine whether the baby will be a boy or a girl. At 6-8 weeks, the eyes and face will be formed, and the heartbeat can be seen on ultrasound. At the end of 12 weeks, all the baby's organs will be formed.  Now that you are pregnant, you will want to do everything you can to have a healthy baby. Two of the most important things are to get good prenatal care and to follow your health care provider's instructions. Prenatal care is all the medical care you receive before the baby's birth. This care will help prevent, find, and treat any problems during the pregnancy and childbirth.  Body changes during your first trimester  Your body goes through many changes during pregnancy. The changes vary from woman to woman.   You may gain or lose a couple of pounds at first.   You may feel sick to your stomach (nauseous) and you may throw up (vomit). If the vomiting is uncontrollable, call your health care provider.   You may tire easily.   You may develop headaches that can be relieved by medicines. All medicines should be approved by your health care provider.   You may urinate more often. Painful urination may mean you have a bladder infection.   You may develop heartburn as a result of your pregnancy.   You may develop constipation because certain hormones are causing the muscles that push stool through your intestines to slow down.   You may develop hemorrhoids or swollen veins (varicose veins).   Your breasts may begin to grow larger and become tender. Your nipples may stick out more, and the tissue that surrounds them (areola) may become darker.   Your gums may bleed and may be  sensitive to brushing and flossing.   Dark spots or blotches (chloasma, mask of pregnancy) may develop on your face. This will likely fade after the baby is born.   Your menstrual periods will stop.   You may have a loss of appetite.   You may develop cravings for certain kinds of food.   You may have changes in your emotions from day to day, such as being excited to be pregnant or being concerned that something may go wrong with the pregnancy and baby.   You may have more vivid and strange dreams.   You may have changes in your hair. These can include thickening of your hair, rapid growth, and changes in texture. Some women also have hair loss during or after pregnancy, or hair that feels dry or thin. Your hair will most likely return to normal after your baby is born.  What to expect at prenatal visits  During a routine prenatal visit:   You will be weighed to make sure you and the baby are growing normally.   Your blood pressure will be taken.   Your abdomen will be measured to track your baby's growth.   The fetal heartbeat will be listened to between weeks 10 and 14 of your pregnancy.   Test results from any previous visits will be discussed.  Your health care provider may ask you:     How you are feeling.   If you are feeling the baby move.   If you have had any abnormal symptoms, such as leaking fluid, bleeding, severe headaches, or abdominal cramping.   If you are using any tobacco products, including cigarettes, chewing tobacco, and electronic cigarettes.   If you have any questions.  Other tests that may be performed during your first trimester include:   Blood tests to find your blood type and to check for the presence of any previous infections. The tests will also be used to check for low iron levels (anemia) and protein on red blood cells (Rh antibodies). Depending on your risk factors, or if you previously had diabetes during pregnancy, you may have tests to check for high blood sugar  that affects pregnant women (gestational diabetes).   Urine tests to check for infections, diabetes, or protein in the urine.   An ultrasound to confirm the proper growth and development of the baby.   Fetal screens for spinal cord problems (spina bifida) and Down syndrome.   HIV (human immunodeficiency virus) testing. Routine prenatal testing includes screening for HIV, unless you choose not to have this test.   You may need other tests to make sure you and the baby are doing well.  Follow these instructions at home:  Medicines   Follow your health care provider's instructions regarding medicine use. Specific medicines may be either safe or unsafe to take during pregnancy.   Take a prenatal vitamin that contains at least 600 micrograms (mcg) of folic acid.   If you develop constipation, try taking a stool softener if your health care provider approves.  Eating and drinking     Eat a balanced diet that includes fresh fruits and vegetables, whole grains, good sources of protein such as meat, eggs, or tofu, and low-fat dairy. Your health care provider will help you determine the amount of weight gain that is right for you.   Avoid raw meat and uncooked cheese. These carry germs that can cause birth defects in the baby.   Eating four or five small meals rather than three large meals a day may help relieve nausea and vomiting. If you start to feel nauseous, eating a few soda crackers can be helpful. Drinking liquids between meals, instead of during meals, also seems to help ease nausea and vomiting.   Limit foods that are high in fat and processed sugars, such as fried and sweet foods.   To prevent constipation:  ? Eat foods that are high in fiber, such as fresh fruits and vegetables, whole grains, and beans.  ? Drink enough fluid to keep your urine clear or pale yellow.  Activity   Exercise only as directed by your health care provider. Most women can continue their usual exercise routine during  pregnancy. Try to exercise for 30 minutes at least 5 days a week. Exercising will help you:  ? Control your weight.  ? Stay in shape.  ? Be prepared for labor and delivery.   Experiencing pain or cramping in the lower abdomen or lower back is a good sign that you should stop exercising. Check with your health care provider before continuing with normal exercises.   Try to avoid standing for long periods of time. Move your legs often if you must stand in one place for a long time.   Avoid heavy lifting.   Wear low-heeled shoes and practice good posture.   You may continue to have sex unless your health care   provider tells you not to.  Relieving pain and discomfort   Wear a good support bra to relieve breast tenderness.   Take warm sitz baths to soothe any pain or discomfort caused by hemorrhoids. Use hemorrhoid cream if your health care provider approves.   Rest with your legs elevated if you have leg cramps or low back pain.   If you develop varicose veins in your legs, wear support hose. Elevate your feet for 15 minutes, 3-4 times a day. Limit salt in your diet.  Prenatal care   Schedule your prenatal visits by the twelfth week of pregnancy. They are usually scheduled monthly at first, then more often in the last 2 months before delivery.   Write down your questions. Take them to your prenatal visits.   Keep all your prenatal visits as told by your health care provider. This is important.  Safety   Wear your seat belt at all times when driving.   Make a list of emergency phone numbers, including numbers for family, friends, the hospital, and police and fire departments.  General instructions   Ask your health care provider for a referral to a local prenatal education class. Begin classes no later than the beginning of month 6 of your pregnancy.   Ask for help if you have counseling or nutritional needs during pregnancy. Your health care provider can offer advice or refer you to specialists for help  with various needs.   Do not use hot tubs, steam rooms, or saunas.   Do not douche or use tampons or scented sanitary pads.   Do not cross your legs for long periods of time.   Avoid cat litter boxes and soil used by cats. These carry germs that can cause birth defects in the baby and possibly loss of the fetus by miscarriage or stillbirth.   Avoid all smoking, herbs, alcohol, and medicines not prescribed by your health care provider. Chemicals in these products affect the formation and growth of the baby.   Do not use any products that contain nicotine or tobacco, such as cigarettes and e-cigarettes. If you need help quitting, ask your health care provider. You may receive counseling support and other resources to help you quit.   Schedule a dentist appointment. At home, brush your teeth with a soft toothbrush and be gentle when you floss.  Contact a health care provider if:   You have dizziness.   You have mild pelvic cramps, pelvic pressure, or nagging pain in the abdominal area.   You have persistent nausea, vomiting, or diarrhea.   You have a bad smelling vaginal discharge.   You have pain when you urinate.   You notice increased swelling in your face, hands, legs, or ankles.   You are exposed to fifth disease or chickenpox.   You are exposed to German measles (rubella) and have never had it.  Get help right away if:   You have a fever.   You are leaking fluid from your vagina.   You have spotting or bleeding from your vagina.   You have severe abdominal cramping or pain.   You have rapid weight gain or loss.   You vomit blood or material that looks like coffee grounds.   You develop a severe headache.   You have shortness of breath.   You have any kind of trauma, such as from a fall or a car accident.  Summary   The first trimester of pregnancy is from week 1 until   the end of week 13 (months 1 through 3).   Your body goes through many changes during pregnancy. The changes vary from  woman to woman.   You will have routine prenatal visits. During those visits, your health care provider will examine you, discuss any test results you may have, and talk with you about how you are feeling.  This information is not intended to replace advice given to you by your health care provider. Make sure you discuss any questions you have with your health care provider.  Document Released: 11/27/2001 Document Revised: 11/14/2016 Document Reviewed: 11/14/2016  [employer]vier Interactive Patient Education  2019 [employer]

## 2019-04-27 LAB — GC/CHLAMYDIA PROBE AMP (~~LOC~~) NOT AT ARMC
Chlamydia: NEGATIVE
Neisseria Gonorrhea: NEGATIVE

## 2019-05-04 ENCOUNTER — Other Ambulatory Visit: Payer: Self-pay | Admitting: Obstetrics

## 2019-05-07 DIAGNOSIS — J339 Nasal polyp, unspecified: Secondary | ICD-10-CM | POA: Insufficient documentation

## 2019-05-07 DIAGNOSIS — R0981 Nasal congestion: Secondary | ICD-10-CM | POA: Insufficient documentation

## 2019-05-12 ENCOUNTER — Ambulatory Visit (HOSPITAL_COMMUNITY): Payer: BC Managed Care – PPO

## 2019-05-14 ENCOUNTER — Ambulatory Visit (HOSPITAL_COMMUNITY): Payer: BC Managed Care – PPO

## 2019-05-25 ENCOUNTER — Ambulatory Visit (HOSPITAL_COMMUNITY): Payer: BC Managed Care – PPO

## 2019-05-29 ENCOUNTER — Encounter (HOSPITAL_COMMUNITY): Payer: Self-pay | Admitting: *Deleted

## 2019-05-29 ENCOUNTER — Inpatient Hospital Stay (HOSPITAL_COMMUNITY)
Admission: AD | Admit: 2019-05-29 | Discharge: 2019-05-29 | Disposition: A | Discharge status: Home / Self Care | Payer: BC Managed Care – PPO | Attending: Obstetrics & Gynecology | Admitting: Obstetrics & Gynecology

## 2019-05-29 ENCOUNTER — Other Ambulatory Visit: Payer: Self-pay

## 2019-05-29 DIAGNOSIS — O210 Mild hyperemesis gravidarum: Secondary | ICD-10-CM | POA: Diagnosis present

## 2019-05-29 DIAGNOSIS — O219 Vomiting of pregnancy, unspecified: Secondary | ICD-10-CM

## 2019-05-29 DIAGNOSIS — Z3A10 10 weeks gestation of pregnancy: Secondary | ICD-10-CM

## 2019-05-29 DIAGNOSIS — Z87891 Personal history of nicotine dependence: Secondary | ICD-10-CM | POA: Insufficient documentation

## 2019-05-29 LAB — URINALYSIS, ROUTINE W REFLEX MICROSCOPIC
Bilirubin Urine: NEGATIVE
Glucose, UA: NEGATIVE mg/dL
Hgb urine dipstick: NEGATIVE
Ketones, ur: NEGATIVE mg/dL
Nitrite: NEGATIVE
Protein, ur: NEGATIVE mg/dL
Specific Gravity, Urine: 1.006 (ref 1.005–1.030)
pH: 8 (ref 5.0–8.0)

## 2019-05-29 MED ORDER — ONDANSETRON 4 MG PO TBDP
4.0000 mg | ORAL_TABLET | Freq: Once | ORAL | Status: AC
  Administered 2019-05-29: 4 mg via ORAL
  Filled 2019-05-29: qty 1

## 2019-05-29 MED ORDER — PROMETHAZINE HCL 12.5 MG PO TABS: 12.5000 mg | ORAL_TABLET | Freq: Four times a day (QID) | ORAL | 1 refills | Status: DC | PRN

## 2019-05-29 NOTE — MAU Provider Note (Signed)
History     CSN: [id number]  Arrival date and time: 05/29/19 2025   None     Chief Complaint  Patient presents with  . Nausea   HPI   Jessica Vasquez is 28 y.o. [id number] female at 8174w3d who presents for nausea. Reports this has been occurring x2 weeks. She does not have any medications at home. Emesis x3 today. She has been tolerating drinking plenty of fluids. Last ate this morning. Normal amount of urination.   OB History    Gravida  4   Para  2   Term  2   Preterm  0   AB  1   Living  2     SAB  1   TAB  0   Ectopic  0   Multiple  0   Live Births  2           Past Medical History:  Diagnosis Date  . Anemia   . Cholelithiasis   . Herpes    last outbreak two weeks ago ( mid march 2015)  . Hydradenitis   . Trichimoniasis 01/14/2019    Past Surgical History:  Procedure Laterality Date  . CHOLECYSTECTOMY N/A 10/15/2015   Procedure: LAPAROSCOPIC CHOLECYSTECTOMY WITH INTRAOPERATIVE CHOLANGIOGRAM;  Surgeon: Manus RuddMatthew Tsuei, MD;  Location: [facility] OR;  Service: General;  Laterality: N/A;  . ERCP N/A 10/16/2015   Procedure: ENDOSCOPIC RETROGRADE CHOLANGIOPANCREATOGRAPHY (ERCP);  Surgeon: Iva Booparl E Gessner, MD;  [facility];  Service: Endoscopy;  Laterality: N/A;  . LAPAROSCOPIC CHOLECYSTECTOMY W/ CHOLANGIOGRAPHY    . nasal sugery      Family History  Problem Relation Age of Onset  . Gallstones Mother     Social History   Tobacco Use  . Smoking status: Former Smoker    Quit date: 05/16/2011    Years since quitting: 8.0  . Smokeless tobacco: Never Used  Substance Use Topics  . Alcohol use: No  . Drug use: No    Allergies: No Known Allergies  Medications Prior to Admission  Medication Sig Dispense Refill Last Dose  . Prenatal Vit-Fe Fumarate-FA (PREPLUS) 27-1 MG TABS Take 1 tablet by mouth daily. 30 tablet 13     Review of Systems  Constitutional: Positive for appetite change. Negative for chills and fever.  Respiratory: Negative for  shortness of breath.   Cardiovascular: Negative for chest pain.  Gastrointestinal: Positive for nausea and vomiting. Negative for abdominal pain.  Genitourinary: Negative for difficulty urinating and dysuria.  Skin: Negative for rash.  Neurological: Negative for dizziness and light-headedness.   Physical Exam   Blood pressure 116/64, pulse 94, temperature 98.1 F (36.7 C), resp. rate 18, height 5\' 9"  (1.753 m), weight 111.6 kg, currently breastfeeding.  Physical Exam  Constitutional: She is oriented to person, place, and time. She appears well-developed and well-nourished. No distress.  HENT:  Head: Normocephalic and atraumatic.  Mouth/Throat: Oropharynx is clear and moist.  Eyes: Conjunctivae and EOM are normal.  Neck: Normal range of motion. Neck supple.  Cardiovascular: Normal rate and regular rhythm.  Respiratory: Effort normal and breath sounds normal. No respiratory distress.  GI: Soft. She exhibits no distension. There is no abdominal tenderness. There is no rebound and no guarding.  Musculoskeletal: Normal range of motion.        General: No edema.  Neurological: She is alert and oriented to person, place, and time.  Skin: Skin is warm and dry. She is not diaphoretic.  Psychiatric: She has a normal mood and affect.  Her behavior is normal.   FHR Doppler: 170 bpm   MAU Course  Procedures  MDM Patient requests oral anti-emetic. Zofran 4mg  ODT given and patient requests PO fluids. UA without ketones and normal specific gravity.   Assessment and Plan   1. Nausea and vomiting in pregnancy   2. [redacted] weeks gestation of pregnancy    Patient tolerating PO intake well and requesting to go home on re-evaluation. Will send with Rx for phenergan. Discussed nausea and vomiting relief measures in pregnancy. Return precautions for dehydration discussed.   Melina Schools 05/29/2019, 9:03 PM

## 2019-05-29 NOTE — Progress Notes (Signed)
DR Cat Juleen China notified of pt's status of feeling better after Zofran and tolerating water and ready to go home. Will put in d/c instructions

## 2019-05-29 NOTE — MAU Note (Signed)
Nausea and vomiting for 2wks but today just worse. Has not started care yet and needs meds for nausea. Denies vag bleeding or d/c

## 2019-05-29 NOTE — Progress Notes (Signed)
WRitten and verbal d/c instructions given and understanding vocied. 

## 2019-05-30 NOTE — Discharge Instructions (Signed)
Nausea & Vomiting  Have saltine crackers or pretzels by your bed and eat a few bites before you raise your head out of bed in the morning  Eat small frequent meals throughout the day instead of large meals  Drink plenty of fluids throughout the day to stay hydrated, just don't drink a lot of fluids with your meals.  This can make your stomach fill up faster making you feel sick  Do not brush your teeth right after you eat  Products with real ginger are good for nausea, like ginger ale and ginger hard candy Make sure it says made with real ginger!  Sucking on sour candy like lemon heads is also good for nausea  If your prenatal vitamins make you nauseated, take them at night so you will sleep through the nausea  Sea Bands  If you feel like you need medicine for the nausea & vomiting please let us know  If you are unable to keep any fluids or food down please let us know    

## 2019-06-02 ENCOUNTER — Ambulatory Visit (HOSPITAL_COMMUNITY)
Admission: RE | Admit: 2019-06-02 | Discharge: 2019-06-02 | Disposition: A | Discharge status: Home / Self Care | Payer: BC Managed Care – PPO | Source: Ambulatory Visit | Attending: Obstetrics and Gynecology | Admitting: Obstetrics and Gynecology

## 2019-06-02 ENCOUNTER — Other Ambulatory Visit: Payer: Self-pay

## 2019-06-02 DIAGNOSIS — Z6730 Type AB blood, Rh positive: Secondary | ICD-10-CM | POA: Insufficient documentation

## 2019-06-02 DIAGNOSIS — Z3490 Encounter for supervision of normal pregnancy, unspecified, unspecified trimester: Secondary | ICD-10-CM | POA: Insufficient documentation

## 2019-06-02 DIAGNOSIS — O209 Hemorrhage in early pregnancy, unspecified: Secondary | ICD-10-CM | POA: Insufficient documentation

## 2019-06-09 ENCOUNTER — Ambulatory Visit (INDEPENDENT_AMBULATORY_CARE_PROVIDER_SITE_OTHER): Payer: BC Managed Care – PPO

## 2019-06-09 ENCOUNTER — Other Ambulatory Visit: Payer: Self-pay

## 2019-06-09 DIAGNOSIS — O0990 Supervision of high risk pregnancy, unspecified, unspecified trimester: Secondary | ICD-10-CM

## 2019-06-09 DIAGNOSIS — Z3480 Encounter for supervision of other normal pregnancy, unspecified trimester: Secondary | ICD-10-CM

## 2019-06-09 HISTORY — DX: Supervision of high risk pregnancy, unspecified, unspecified trimester: O09.90

## 2019-06-09 NOTE — Progress Notes (Signed)
I connected with  Jessica Vasquez on 06/09/19 by a video enabled telemedicine application and verified that I am speaking with the correct person using two identifiers.   I discussed the limitations of evaluation and management by telemedicine. The patient expressed understanding and agreed to proceed.  New OB intake questions completed. Patient downloaded the [employer] App.

## 2019-06-24 ENCOUNTER — Ambulatory Visit (INDEPENDENT_AMBULATORY_CARE_PROVIDER_SITE_OTHER): Payer: BC Managed Care – PPO | Admitting: Advanced Practice Midwife

## 2019-06-24 ENCOUNTER — Encounter: Payer: Self-pay | Admitting: Advanced Practice Midwife

## 2019-06-24 ENCOUNTER — Other Ambulatory Visit: Payer: Self-pay

## 2019-06-24 ENCOUNTER — Other Ambulatory Visit (HOSPITAL_COMMUNITY)
Admission: RE | Admit: 2019-06-24 | Discharge: 2019-06-24 | Disposition: A | Discharge status: Home / Self Care | Payer: BC Managed Care – PPO | Source: Ambulatory Visit | Attending: Advanced Practice Midwife | Admitting: Advanced Practice Midwife

## 2019-06-24 VITALS — BP 130/87 | HR 104 | Wt 250.8 lb

## 2019-06-24 DIAGNOSIS — Z3480 Encounter for supervision of other normal pregnancy, unspecified trimester: Secondary | ICD-10-CM | POA: Diagnosis present

## 2019-06-24 DIAGNOSIS — Z3482 Encounter for supervision of other normal pregnancy, second trimester: Secondary | ICD-10-CM

## 2019-06-24 DIAGNOSIS — Z3481 Encounter for supervision of other normal pregnancy, first trimester: Secondary | ICD-10-CM | POA: Diagnosis not present

## 2019-06-24 DIAGNOSIS — Z3A14 14 weeks gestation of pregnancy: Secondary | ICD-10-CM

## 2019-06-24 MED ORDER — ONDANSETRON 4 MG PO TBDP: 4.0000 mg | ORAL_TABLET | Freq: Three times a day (TID) | ORAL | 5 refills | Status: DC | PRN

## 2019-06-24 MED ORDER — BLOOD PRESSURE MONITORING KIT: 1.0000 | PACK | 0 refills | Status: DC

## 2019-06-24 NOTE — Patient Instructions (Signed)
Childbirth Education Options: [facility] Classes:  Childbirth education classes can help you get ready for a positive parenting experience. You can also meet other expectant parents and get free stuff for your baby. Each class runs for five weeks on the same night and costs $45 for the mother-to-be and her support person. Medicaid covers the cost if you are eligible. Call [phone] to register. [facility] Childbirth Education:  [phone] or [phone] or [email or web address]  Baby & Me Class: Discuss newborn & infant parenting and family adjustment issues with other new mothers in a relaxed environment. Each week brings a new speaker or baby-centered activity. We encourage new mothers to join us every Thursday at 11:00am. Babies birth until crawling. No registration or fee. Daddy Boot Camp: This course offers Dads-to-be the tools and knowledge needed to feel confident on their journey to becoming new fathers. Experienced dads, who have been trained as coaches, teach dads-to-be how to hold, comfort, diaper, swaddle and play with their infant while being able to support the new mom as well. A class for men taught by men. $25/dad Big Brother/Big Sister: Let your children share in the joy of a new brother or sister in this special class designed just for them. Class includes discussion about how families care for babies: swaddling, holding, diapering, safety as well as how they can be helpful in their new role. This class is designed for children ages 2 to 6, but any age is welcome. Please register each child individually. $5/child  Mom Talk: This mom-led group offers support and connection to mothers as they journey through the adjustments and struggles of that sometimes overwhelming first year after the birth of a child. Tuesdays at 10:00am and Thursdays at 6:00pm. Babies welcome. No registration or fee. Breastfeeding Support Group: This group is a mother-to-mother  support circle where moms have the opportunity to share their breastfeeding experiences. A Lactation Consultant is present for questions and concerns. Meets each Tuesday at 11:00am. No fee or registration. Breastfeeding Your Baby: Learn what to expect in the first days of breastfeeding your newborn.  This class will help you feel more confident with the skills needed to begin your breastfeeding experience. Many new mothers are concerned about breastfeeding after leaving the hospital. This class will also address the most common fears and challenges about breastfeeding during the first few weeks, months and beyond. (call for fee) Comfort Techniques and Tour: This 2 hour interactive class will provide you the opportunity to learn & practice hands-on techniques that can help relieve some of the discomfort of labor and encourage your baby to rotate toward the best position for birth. You and your partner will be able to try a variety of labor positions with birth balls and rebozos as well as practice breathing, relaxation, and visualization techniques. A tour of the [facility] is included with this class. $20 per registrant and support person Childbirth Class- Weekend Option: This class is a Weekend version of our Birth & Baby series. It is designed for parents who have a difficult time fitting several weeks of classes into their schedule. It covers the care of your newborn and the basics of labor and childbirth. It also includes a Maternity Care Center Tour of [facility] and lunch. The class is held two consecutive days: beginning on Friday evening from 6:30 - 8:30 p.m. and the next day, Saturday from 9 a.m. - 4 p.m. (call for fee) Waterbirth Class: Interested in a waterbirth?  This   informational class will help you discover whether waterbirth is the right fit for you. Education about waterbirth itself, supplies you would need and how to assemble your support team is what you can  expect from this class. Some obstetrical practices require this class in order to pursue a waterbirth. (Not all obstetrical practices offer waterbirth-check with your healthcare provider.) Register only the expectant mom, but you are encouraged to bring your partner to class! Required if planning waterbirth, no fee. Infant/Child CPR: Parents, grandparents, babysitters, and friends learn Cardio-Pulmonary Resuscitation skills for infants and children. You will also learn how to treat both conscious and unconscious choking in infants and children. This Family & Friends program does not offer certification. Register each participant individually to ensure that enough mannequins are available. (Call for fee) Grandparent Love: Expecting a grandbaby? This class is for you! Learn about the latest infant care and safety recommendations and ways to support your own child as he or she transitions into the parenting role. Taught by Registered Nurses who are childbirth instructors, but most importantly...they are grandmothers too! $10/person. Childbirth Class- Natural Childbirth: This series of 5 weekly classes is for expectant parents who want to learn and practice natural methods of coping with the process of labor and childbirth. Relaxation, breathing, massage, visualization, role of the partner, and helpful positioning are highlighted. Participants learn how to be confident in their body's ability to give birth. This class will empower and help parents make informed decisions about their own care. Includes discussion that will help new parents transition into the immediate postpartum period. Maternity Care Center Tour of [facility] is included. We suggest taking this class between 25-32 weeks, but it's only a recommendation. $75 per registrant and one support person or $30 Medicaid. Childbirth Class- 3 week Series: This option of 3 weekly classes helps you and your labor partner prepare for childbirth. Newborn  care, labor & birth, cesarean birth, pain management, and comfort techniques are discussed and a Maternity Care Center Tour of [facility] is included. The class meets at the same time, on the same day of the week for 3 consecutive weeks beginning with the starting date you choose. $60 for registrant and one support person.  Marvelous Multiples: Expecting twins, triplets, or more? This class covers the differences in labor, birth, parenting, and breastfeeding issues that face multiples' parents. NICU tour is included. Led by a Certified Childbirth Educator who is the mother of twins. No fee. Caring for Baby: This class is for expectant and adoptive parents who want to learn and practice the most up-to-date newborn care for their babies. Focus is on birth through the first six weeks of life. Topics include feeding, bathing, diapering, crying, umbilical cord care, circumcision care and safe sleep. Parents learn to recognize symptoms of illness and when to call the pediatrician. Register only the mom-to-be and your partner or support person can plan to come with you! $10 per registrant and support person Childbirth Class- online option: This online class offers you the freedom to complete a Birth and Baby series in the comfort of your own home. The flexibility of this option allows you to review sections at your own pace, at times convenient to you and your support people. It includes additional video information, animations, quizzes, and extended activities. Get organized with helpful eClass tools, checklists, and trackers. Once you register online for the class, you will receive an email within a few days to accept the invitation and begin the class when the time   is right for you. The content will be available to you for 60 days. $60 for 60 days of online access for you and your support people.  Local Doulas: Natural Baby Doulas [email or web address] Tel:  [phone] [email or web address] [phone] [email or web address] The Labor Hassell Halim  (also do waterbirth tub rental) [phone] [email or web address] [email or web address] Birth Doula [phone] [email or web address] [facility] Rhythms  [phone] [email or web address] [employer] (PADA) [email or web address] [place] [facility] Baby  [email or web address] Considering Waterbirth? Guide for patients at [facility] [employer]  Why consider waterbirth?  . Gentle birth for babies . Less pain medicine used in labor . May allow for passive descent/less pushing . May reduce perineal tears  . More mobility and instinctive maternal position changes . Increased maternal relaxation . Reduced blood pressure in labor  Is waterbirth safe? What are the risks of infection, drowning or other complications?  . Infection: o Very low risk (3.7 % for tub vs 4.8% for bed) o 7 in 8000 waterbirths with documented infection o Poorly cleaned equipment most common cause o Slightly lower group B strep transmission rate  . Drowning o Maternal:  - Very low risk   - Related to seizures or fainting o Newborn:  - Very low risk. No evidence of increased risk of respiratory problems in multiple large studies - Physiological protection from breathing under water - Avoid underwater birth if there are any fetal complications - Once baby's head is out of the water, keep it out.  . Birth complication o Some reports of cord trauma, but risk decreased by bringing baby to surface gradually o No evidence of increased risk of shoulder dystocia. Mothers can usually change positions faster in water than in a bed, possibly aiding the maneuvers to free the shoulder.   You must attend a Doren Custard class at [facility]  3rd Wednesday of every month from 7-9pm  [employer] by calling [phone] or online at [email or web address]  Bring [place] the certificate from the class to your prenatal appointment  Meet with a midwife at 36 weeks to see if you can still plan a waterbirth and to sign the consent.   Things that would prevent you from having a waterbirth:  Premature, <37wks  Previous cesarean birth  Presence of thick meconium-stained fluid  Multiple gestation (Twins, triplets, etc.)  Uncontrolled diabetes or gestational diabetes requiring medication  Hypertension requiring medication or diagnosis of pre-eclampsia  Heavy vaginal bleeding  Non-reassuring fetal heart rate  Active infection (MRSA, etc.). Group B Strep is NOT a contraindication for  waterbirth.  If your labor has to be induced and induction method requires continuous  monitoring of the baby's heart rate  Other risks/issues identified by your obstetrical provider  Please remember that birth is unpredictable. Under certain unforeseeable circumstances your provider may advise against giving birth in the tub. These decisions will be made on a case-by-case basis and with the safety of you and your baby as our highest priority.

## 2019-06-24 NOTE — Addendum Note (Signed)
Addended by: Marcille Buffy D on: 06/24/2019 02:00 PM   Modules accepted: Orders

## 2019-06-24 NOTE — Progress Notes (Signed)
NOB in office, NOB intake previously completed on 06-09-19. Pt not feeling fetal movement yet, denies pain today.

## 2019-06-24 NOTE — Progress Notes (Signed)
Subjective:   Jessica Vasquez is a 28 y.o. [id number] at 43w1dby LMP, early ultrasound being seen today for her first obstetrical visit.  Her obstetrical history is significant for cholestasis of pregnancy. Patient does intend to breast feed. Pregnancy history fully reviewed.  Patient reports no complaints.  HISTORY: OB History  Gravida Para Term Preterm AB Living  4 2 2  0 1 2  SAB TAB Ectopic Multiple Live Births  1 0 0 0 2    # Outcome Date GA Lbr Len/2nd Weight Sex Delivery Anes PTL Lv  4 Current           3 Term 04/27/14 350w6d0:10 / 00:05 7 lb 13 oz (3.544 kg) M Vag-Spont EPI  LIV     Name: Acebo,BOY Carren     Apgar1: 8  Apgar5: 9  2 Term 04/09/12 4171w1d:50 / 00:58 8 lb 13.8 oz (4.02 kg) F Vag-Spont EPI  LIV     Name: Rios,GIRL Nhi     Apgar1: 8  Apgar5: 9  1 SAB             Last pap smear was done unsure and was unsure   Past Medical History:  Diagnosis Date  . Anemia   . Asthma   . Cholelithiasis   . Headache   . Herpes    last outbreak two weeks ago ( mid march 2015)  . Hydradenitis   . Sleep apnea   . Trichimoniasis 01/14/2019   Past Surgical History:  Procedure Laterality Date  . CHOLECYSTECTOMY N/A 10/15/2015   Procedure: LAPAROSCOPIC CHOLECYSTECTOMY WITH INTRAOPERATIVE CHOLANGIOGRAM;  Surgeon: MatDonnie MesaD;  [facility];  Laterality: N/A;  . ERCP N/A 10/16/2015   Procedure: ENDOSCOPIC RETROGRADE CHOLANGIOPANCREATOGRAPHY (ERCP);  Surgeon: CarGatha MayerD;  [facility];  Service: Endoscopy;  Laterality: N/A;  . LAPAROSCOPIC CHOLECYSTECTOMY W/ CHOLANGIOGRAPHY    . nasal sugery     Family History  Problem Relation Age of Onset  . Gallstones Mother   . Arthritis Mother   . ADD / ADHD Brother   . Diabetes Maternal Grandmother   . Heart disease Maternal Grandmother   . Hypertension Maternal Grandmother   . Stroke Maternal Grandmother    Social History   Tobacco Use  . Smoking status: Former Smoker    Quit  date: 05/16/2011    Years since quitting: 8.1  . Smokeless tobacco: Never Used  Substance Use Topics  . Alcohol use: No  . Drug use: No   No Known Allergies Current Outpatient Medications on File Prior to Visit  Medication Sig Dispense Refill  . cetirizine-pseudoephedrine (ZYRTEC-D) 5-120 MG tablet Take 1 tablet by mouth as needed for allergies.    . Prenatal Vit-Fe Fumarate-FA (PREPLUS) 27-1 MG TABS Take 1 tablet by mouth daily. 30 tablet 13  . promethazine (PHENERGAN) 12.5 MG tablet Take 1 tablet (12.5 mg total) by mouth every 6 (six) hours as needed for nausea or vomiting. (Patient not taking: Reported on 06/24/2019) 30 tablet 1   No current facility-administered medications on file prior to visit.     Review of Systems Pertinent items noted in HPI and remainder of comprehensive ROS otherwise negative.  Exam   Vitals:   06/24/19 1311  BP: 130/87  Pulse: (!) 104  Weight: 250 lb 12.8 oz (113.8 kg)   Fetal Heart Rate (bpm): 161  Physical Exam  Constitutional: She is oriented to person, place, and time and well-developed, well-nourished, and in no  distress.  HENT:  Head: Normocephalic.  Cardiovascular: Normal rate.  Pulmonary/Chest: Effort normal.  Abdominal: Soft. There is no abdominal tenderness. There is no rebound.  Genitourinary:    Genitourinary Comments:  External: no lesion Vagina: small amount of white discharge Cervix: pink, smooth, no CMT Uterus: AGA, FHT 161 with doppler     Neurological: She is alert and oriented to person, place, and time.  Skin: Skin is warm and dry.  Psychiatric: Affect normal.  Nursing note and vitals reviewed.   Assessment:   Pregnancy: [id number] Patient Active Problem List   Diagnosis Date Noted  . Supervision of other normal pregnancy, antepartum 06/09/2019  . Choledocholithiasis   . BV (bacterial vaginosis) 11/17/2013  . Anemia 10/20/2013  . 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 Chronic cholecystitis with  calculus 12/29/2012     Plan:  1. Supervision of other normal pregnancy, antepartum  - Cytology - PAP( [place]) - Cervicovaginal ancillary only( [place]) - Culture, OB Urine - Genetic Screening - Obstetric Panel, Including HIV - Blood Pressure Monitoring KIT; 1 kit by Does not apply route once a week.  Dispense: 1 kit; Refill: 0 - Babyscripts Schedule Optimization - [place] MFM OB COMP + 14 WK; Future   Initial labs drawn. Continue prenatal vitamins. Genetic Screening discussed, AFP and NIPS: requested. Ultrasound discussed; fetal anatomic survey: ordered. Problem list reviewed and updated. The nature of [place] with multiple MDs and other Advanced Practice Providers was explained to patient; also emphasized that residents, students are part of our team. Routine obstetric precautions reviewed. 50% of 45 min visit spent in counseling and coordination of care. Return in about 6 weeks (around 08/05/2019) for Virtual visit .   Marcille Buffy DNP, CNM  06/24/19  1:31 PM

## 2019-06-25 LAB — OBSTETRIC PANEL, INCLUDING HIV
Antibody Screen: NEGATIVE
Basophils Absolute: 0 10*3/uL (ref 0.0–0.2)
Basos: 0 %
EOS (ABSOLUTE): 0.2 10*3/uL (ref 0.0–0.4)
Eos: 2 %
HIV Screen 4th Generation wRfx: NONREACTIVE
Hematocrit: 35.5 % (ref 34.0–46.6)
Hemoglobin: 11.7 g/dL (ref 11.1–15.9)
Hepatitis B Surface Ag: NEGATIVE
Immature Grans (Abs): 0 10*3/uL (ref 0.0–0.1)
Immature Granulocytes: 0 %
Lymphocytes Absolute: 2.4 10*3/uL (ref 0.7–3.1)
Lymphs: 25 %
MCH: 28.8 pg (ref 26.6–33.0)
MCHC: 33 g/dL (ref 31.5–35.7)
MCV: 87 fL (ref 79–97)
Monocytes Absolute: 0.7 10*3/uL (ref 0.1–0.9)
Monocytes: 7 %
Neutrophils Absolute: 6.1 10*3/uL (ref 1.4–7.0)
Neutrophils: 66 %
Platelets: 256 10*3/uL (ref 150–450)
RBC: 4.06 x10E6/uL (ref 3.77–5.28)
RDW: 13.3 % (ref 11.7–15.4)
RPR Ser Ql: NONREACTIVE
Rh Factor: POSITIVE
Rubella Antibodies, IGG: 2.25 index (ref >0.99)
WBC: 9.6 10*3/uL (ref 3.4–10.8)

## 2019-06-25 LAB — CYTOLOGY - PAP: Diagnosis: NEGATIVE

## 2019-06-26 LAB — CERVICOVAGINAL ANCILLARY ONLY
Bacterial vaginitis: NEGATIVE
Candida vaginitis: POSITIVE — AB
Chlamydia: NEGATIVE
Neisseria Gonorrhea: NEGATIVE
Trichomonas: NEGATIVE

## 2019-06-27 MED ORDER — TERCONAZOLE 0.4 % VA CREA: 1.0000 | TOPICAL_CREAM | Freq: Every day | VAGINAL | 0 refills | Status: DC

## 2019-06-27 NOTE — Addendum Note (Signed)
Addended by: Marcille Buffy D on: 06/27/2019 08:36 PM   Modules accepted: Orders

## 2019-06-28 LAB — CULTURE, OB URINE

## 2019-06-28 LAB — URINE CULTURE, OB REFLEX

## 2019-07-03 ENCOUNTER — Encounter: Payer: Self-pay | Admitting: Advanced Practice Midwife

## 2019-07-06 ENCOUNTER — Telehealth (INDEPENDENT_AMBULATORY_CARE_PROVIDER_SITE_OTHER): Payer: BC Managed Care – PPO

## 2019-07-06 DIAGNOSIS — D563 Thalassemia minor: Secondary | ICD-10-CM

## 2019-07-06 NOTE — Telephone Encounter (Addendum)
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 Please schedule with genetic counselor.  Genetic counseling appt scheduled for July 29th @ 1000. Pt notified of results and appt.  Pt advised that she will be explained on what those results mean in detail and to bring her questions to the appt.  Pt verbalized understanding.

## 2019-07-10 ENCOUNTER — Other Ambulatory Visit: Payer: Self-pay

## 2019-07-10 ENCOUNTER — Encounter (HOSPITAL_COMMUNITY): Payer: Self-pay

## 2019-07-10 ENCOUNTER — Inpatient Hospital Stay (HOSPITAL_COMMUNITY)
Admission: AD | Admit: 2019-07-10 | Discharge: 2019-07-10 | Disposition: A | Discharge status: Home / Self Care | Payer: BC Managed Care – PPO | Source: Ambulatory Visit | Attending: Obstetrics and Gynecology | Admitting: Obstetrics and Gynecology

## 2019-07-10 DIAGNOSIS — Z3A16 16 weeks gestation of pregnancy: Secondary | ICD-10-CM | POA: Diagnosis not present

## 2019-07-10 DIAGNOSIS — Z87891 Personal history of nicotine dependence: Secondary | ICD-10-CM | POA: Diagnosis not present

## 2019-07-10 DIAGNOSIS — O26892 Other specified pregnancy related conditions, second trimester: Secondary | ICD-10-CM | POA: Diagnosis not present

## 2019-07-10 DIAGNOSIS — R51 Headache: Secondary | ICD-10-CM

## 2019-07-10 LAB — URINALYSIS, ROUTINE W REFLEX MICROSCOPIC
Bilirubin Urine: NEGATIVE
Glucose, UA: NEGATIVE mg/dL
Hgb urine dipstick: NEGATIVE
Ketones, ur: NEGATIVE mg/dL
Nitrite: NEGATIVE
Protein, ur: NEGATIVE mg/dL
Specific Gravity, Urine: 1.009 (ref 1.005–1.030)
pH: 7 (ref 5.0–8.0)

## 2019-07-10 MED ORDER — METOCLOPRAMIDE HCL 10 MG PO TABS
10.0000 mg | ORAL_TABLET | Freq: Once | ORAL | Status: AC
  Administered 2019-07-10: 10 mg via ORAL
  Filled 2019-07-10: qty 1

## 2019-07-10 MED ORDER — BUTALBITAL-APAP-CAFFEINE 50-325-40 MG PO CAPS: 1.0000 | ORAL_CAPSULE | Freq: Four times a day (QID) | ORAL | 3 refills | Status: DC | PRN

## 2019-07-10 MED ORDER — ACETAMINOPHEN 325 MG PO TABS
650.0000 mg | ORAL_TABLET | Freq: Once | ORAL | Status: AC
  Administered 2019-07-10: 03:00:00 650 mg via ORAL
  Filled 2019-07-10: qty 2

## 2019-07-10 NOTE — MAU Provider Note (Signed)
Chief Complaint:  Headache   First Provider Initiated Contact with Patient 07/10/19 0224      HPI: Jessica Vasquez is a 28 y.o. G9E0100 at 50w3dho presents to maternity admissions reporting having a headache since 6pm.  Took Aleve which did not help.  Had not tried Tylenol.  No history of Migraines.  . She reports good fetal movement, denies LOF, vaginal bleeding, vaginal itching/burning, urinary symptoms,  dizziness, n/v, diarrhea, constipation or fever/chills.  She denies headache, visual changes or RUQ abdominal pain.  RN Note: Headache since 6 pm.  Took a Aleve and it didn't help.  No bleeding. No abd pain.   Past Medical History: Past Medical History:  Diagnosis Date  . Anemia   . Asthma   . Cholelithiasis   . Headache   . Herpes    last outbreak two weeks ago ( mid march 2015)  . Hydradenitis   . Sleep apnea   . Trichimoniasis 01/14/2019    Past obstetric history: OB History  Gravida Para Term Preterm AB Living  4 2 2  0 1 2  SAB TAB Ectopic Multiple Live Births  1 0 0 0 2    # Outcome Date GA Lbr Len/2nd Weight Sex Delivery Anes PTL Lv  4 Current           3 Term 04/27/14 378w6d0:10 / 00:05 3544 g M Vag-Spont EPI  LIV  2 Term 04/09/12 4137w1d:50 / 00:58 4020 g F Vag-Spont EPI  LIV  1 SAB             Past Surgical History: Past Surgical History:  Procedure Laterality Date  . CHOLECYSTECTOMY N/A 10/15/2015   Procedure: LAPAROSCOPIC CHOLECYSTECTOMY WITH INTRAOPERATIVE CHOLANGIOGRAM;  Surgeon: MatDonnie MesaD;  [facility];  Laterality: N/A;  . ERCP N/A 10/16/2015   Procedure: ENDOSCOPIC RETROGRADE CHOLANGIOPANCREATOGRAPHY (ERCP);  Surgeon: CarGatha MayerD;  [facility];  Service: Endoscopy;  Laterality: N/A;  . LAPAROSCOPIC CHOLECYSTECTOMY W/ CHOLANGIOGRAPHY    . nasal sugery      Family History: Family History  Problem Relation Age of Onset  . Gallstones Mother   . Arthritis Mother   . ADD / ADHD Brother   . Diabetes  Maternal Grandmother   . Heart disease Maternal Grandmother   . Hypertension Maternal Grandmother   . Stroke Maternal Grandmother     Social History: Social History   Tobacco Use  . Smoking status: Former Smoker    Quit date: 05/16/2011    Years since quitting: 8.1  . Smokeless tobacco: Never Used  Substance Use Topics  . Alcohol use: No  . Drug use: No    Allergies: No Known Allergies  Meds:  Medications Prior to Admission  Medication Sig Dispense Refill Last Dose  . Blood Pressure Monitoring KIT 1 kit by Does not apply route once a week. 1 kit 0   . cetirizine-pseudoephedrine (ZYRTEC-D) 5-120 MG tablet Take 1 tablet by mouth as needed for allergies.     . oMarland Kitchendansetron (ZOFRAN ODT) 4 MG disintegrating tablet Take 1 tablet (4 mg total) by mouth every 8 (eight) hours as needed for nausea or vomiting. 20 tablet 5   . Prenatal Vit-Fe Fumarate-FA (PREPLUS) 27-1 MG TABS Take 1 tablet by mouth daily. 30 tablet 13   . promethazine (PHENERGAN) 12.5 MG tablet Take 1 tablet (12.5 mg total) by mouth every 6 (six) hours as needed for nausea or vomiting. (Patient not taking: Reported on 06/24/2019) 30 tablet  1   . terconazole (TERAZOL 7) 0.4 % vaginal cream Place 1 applicator vaginally at bedtime. 45 g 0     I have reviewed patient's Past Medical Hx, Surgical Hx, Family Hx, Social Hx, medications and allergies.   ROS:  Review of Systems  Constitutional: Negative for chills and fever.  Eyes: Negative for photophobia and visual disturbance.  Respiratory: Negative for shortness of breath.   Gastrointestinal: Negative for constipation, diarrhea and nausea.  Genitourinary: Negative for vaginal bleeding.  Neurological: Positive for headaches. Negative for dizziness.   Other systems negative  Physical Exam   Patient Vitals for the past 24 hrs:  BP Temp Temp src Pulse Resp SpO2 Height Weight  07/10/19 0211 132/81 98.3 F (36.8 C) Oral 94 16 100 % 5' 9"  (1.753 m) 112 kg   Constitutional:  Well-developed, well-nourished female in no acute distress.  Cardiovascular: normal rate and rhythm Respiratory: normal effort, clear to auscultation bilaterally GI: Abd soft, non-tender, gravid appropriate for gestational age.   No rebound or guarding. MS: Extremities nontender, no edema, normal ROM Neurologic: Alert and oriented x 4.  GU: Neg CVAT.  PELVIC EXAM: deferred  FHT:  162   Labs: Results for orders placed or performed during the hospital encounter of 07/10/19 (from the past 24 hour(s))  Urinalysis, Routine w reflex microscopic     Status: Abnormal   Collection Time: 07/10/19  2:45 AM  Result Value Ref Range   Color, Urine YELLOW YELLOW   APPearance CLEAR CLEAR   Specific Gravity, Urine 1.009 1.005 - 1.030   pH 7.0 5.0 - 8.0   Glucose, UA NEGATIVE NEGATIVE mg/dL   Hgb urine dipstick NEGATIVE NEGATIVE   Bilirubin Urine NEGATIVE NEGATIVE   Ketones, ur NEGATIVE NEGATIVE mg/dL   Protein, ur NEGATIVE NEGATIVE mg/dL   Nitrite NEGATIVE NEGATIVE   Leukocytes,Ua LARGE (A) NEGATIVE   RBC / HPF 0-5 0 - 5 RBC/hpf   WBC, UA 0-5 0 - 5 WBC/hpf   Bacteria, UA RARE (A) NONE SEEN   Squamous Epithelial / LPF 6-10 0 - 5    AB/Positive/-- (07/08 1333)  Imaging:  No results found.  MAU Course/MDM: I have ordered labs and reviewed results. No dehydration Discussed options for headache relief.  Is driving herself,so cannot  Use sedating drugs .  Treatments in MAU included Reglan and Tylenol  This totally relieved her headache Will Rx Fioricet for PRN home use. .    Assessment: Single intrauterine pregnancy at 61w3dHeadache, resolved  Plan: Discharge home Rx Fioricet for PRN use at home for headaches Follow up in Office for prenatal visits and recheck  Encouraged to return here or to other Urgent Care/ED if she develops worsening of symptoms, increase in pain, fever, or other concerning symptoms.   Pt stable at time of discharge.  MHansel FeinsteinCNM, MSN Certified  Nurse-Midwife 07/10/2019 2:24 AM

## 2019-07-10 NOTE — MAU Note (Signed)
Headache since 6 pm.  Took a Aleve and it didn't help.  No bleeding. No abd pain.

## 2019-07-10 NOTE — Discharge Instructions (Signed)
General Headache Without Cause °A headache is pain or discomfort that is felt around the head or neck area. There are many causes and types of headaches. In some cases, the cause may not be found. °Follow these instructions at home: °Watch your condition for any changes. Let your doctor know about them. Take these steps to help with your condition: °Managing pain ° °  ° °· Take over-the-counter and prescription medicines only as told by your doctor. °· Lie down in a dark, quiet room when you have a headache. °· If told, put ice on your head and neck area: °? Put ice in a plastic bag. °? Place a towel between your skin and the bag. °? Leave the ice on for 20 minutes, 2-3 times per day. °· If told, put heat on the affected area. Use the heat source that your doctor recommends, such as a moist heat pack or a heating pad. °? Place a towel between your skin and the heat source. °? Leave the heat on for 20-30 minutes. °? Remove the heat if your skin turns bright red. This is very important if you are unable to feel pain, heat, or cold. You may have a greater risk of getting burned. °· Keep lights dim if bright lights bother you or make your headaches worse. °Eating and drinking °· Eat meals on a regular schedule. °· If you drink alcohol: °? Limit how much you use to: °§ 0-1 drink a day for women. °§ 0-2 drinks a day for men. °? Be aware of how much alcohol is in your drink. In the [place] one drink equals one 12 oz bottle of beer (355 mL), one 5 oz glass of wine (148 mL), or one 1½ oz glass of hard liquor (44 mL). °· Stop drinking caffeine, or reduce how much caffeine you drink. °General instructions ° °· Keep a journal to find out if certain things bring on headaches. For example, write down: °? What you eat and drink. °? How much sleep you get. °? Any change to your diet or medicines. °· Get a massage or try other ways to relax. °· Limit stress. °· Sit up straight. Do not tighten (tense) your muscles. °· Do not use any  products that contain nicotine or tobacco. This includes cigarettes, e-cigarettes, and chewing tobacco. If you need help quitting, ask your doctor. °· Exercise regularly as told by your doctor. °· Get enough sleep. This often means 7-9 hours of sleep each night. °· Keep all follow-up visits as told by your doctor. This is important. °Contact a doctor if: °· Your symptoms are not helped by medicine. °· You have a headache that feels different than the other headaches. °· You feel sick to your stomach (nauseous) or you throw up (vomit). °· You have a fever. °Get help right away if: °· Your headache gets very bad quickly. °· Your headache gets worse after a lot of physical activity. °· You keep throwing up. °· You have a stiff neck. °· You have trouble seeing. °· You have trouble speaking. °· You have pain in the eye or ear. °· Your muscles are weak or you lose muscle control. °· You lose your balance or have trouble walking. °· You feel like you will pass out (faint) or you pass out. °· You are mixed up (confused). °· You have a seizure. °Summary °· A headache is pain or discomfort that is felt around the head or neck area. °· There are many causes and   types of headaches. In some cases, the cause may not be found. °· Keep a journal to help find out what causes your headaches. Watch your condition for any changes. Let your doctor know about them. °· Contact a doctor if you have a headache that is different from usual, or if your headache is not helped by medicine. °· Get help right away if your headache gets very bad, you throw up, you have trouble seeing, you lose your balance, or you have a seizure. °This information is not intended to replace advice given to you by your health care provider. Make sure you discuss any questions you have with your health care provider. °Document Released: 09/11/2008 Document Revised: 06/23/2018 Document Reviewed: 06/23/2018 °Elsevier Patient Education © 2020 [employer] ° °

## 2019-07-15 ENCOUNTER — Ambulatory Visit (HOSPITAL_COMMUNITY): Payer: BC Managed Care – PPO | Attending: Obstetrics and Gynecology

## 2019-07-15 ENCOUNTER — Ambulatory Visit (HOSPITAL_COMMUNITY): Payer: BC Managed Care – PPO

## 2019-07-15 ENCOUNTER — Ambulatory Visit (HOSPITAL_COMMUNITY): Payer: Self-pay | Admitting: Advanced Practice Midwife

## 2019-07-15 ENCOUNTER — Other Ambulatory Visit: Payer: Self-pay

## 2019-07-15 ENCOUNTER — Ambulatory Visit (HOSPITAL_COMMUNITY): Payer: Self-pay | Admitting: Obstetrics and Gynecology

## 2019-07-15 DIAGNOSIS — Z1379 Encounter for other screening for genetic and chromosomal anomalies: Secondary | ICD-10-CM

## 2019-07-15 NOTE — Progress Notes (Signed)
Pt in telegenetics with Karyn ([facility]). 

## 2019-07-17 LAB — AFP, SERUM, OPEN SPINA BIFIDA
AFP MoM: 1.28
AFP Value: 39.3 ng/mL
Gest. Age on Collection Date: 17.1 weeks
Maternal Age At EDD: 28.6 yr
OSBR Risk 1 IN: 10000
Test Results:: NEGATIVE
Weight: 248 [lb_av]

## 2019-07-27 ENCOUNTER — Other Ambulatory Visit (HOSPITAL_BASED_OUTPATIENT_CLINIC_OR_DEPARTMENT_OTHER): Payer: Self-pay

## 2019-07-27 DIAGNOSIS — R0683 Snoring: Secondary | ICD-10-CM

## 2019-07-28 ENCOUNTER — Ambulatory Visit (HOSPITAL_COMMUNITY)
Admission: RE | Admit: 2019-07-28 | Discharge: 2019-07-28 | Disposition: A | Discharge status: Home / Self Care | Payer: BC Managed Care – PPO | Source: Ambulatory Visit | Attending: Obstetrics and Gynecology | Admitting: Obstetrics and Gynecology

## 2019-07-28 ENCOUNTER — Other Ambulatory Visit: Payer: Self-pay | Admitting: Advanced Practice Midwife

## 2019-07-28 ENCOUNTER — Other Ambulatory Visit: Payer: Self-pay

## 2019-07-28 DIAGNOSIS — Z363 Encounter for antenatal screening for malformations: Secondary | ICD-10-CM

## 2019-07-28 DIAGNOSIS — Z3480 Encounter for supervision of other normal pregnancy, unspecified trimester: Secondary | ICD-10-CM | POA: Insufficient documentation

## 2019-07-28 DIAGNOSIS — O99212 Obesity complicating pregnancy, second trimester: Secondary | ICD-10-CM | POA: Diagnosis not present

## 2019-07-28 DIAGNOSIS — Z3A19 19 weeks gestation of pregnancy: Secondary | ICD-10-CM

## 2019-07-29 ENCOUNTER — Other Ambulatory Visit (HOSPITAL_COMMUNITY): Payer: Self-pay | Admitting: *Deleted

## 2019-07-29 DIAGNOSIS — Z362 Encounter for other antenatal screening follow-up: Secondary | ICD-10-CM

## 2019-08-03 ENCOUNTER — Other Ambulatory Visit (HOSPITAL_COMMUNITY)
Admission: RE | Admit: 2019-08-03 | Discharge: 2019-08-03 | Disposition: A | Discharge status: Home / Self Care | Payer: BC Managed Care – PPO | Source: Ambulatory Visit | Attending: Internal Medicine | Admitting: Internal Medicine

## 2019-08-03 DIAGNOSIS — Z20828 Contact with and (suspected) exposure to other viral communicable diseases: Secondary | ICD-10-CM | POA: Insufficient documentation

## 2019-08-03 DIAGNOSIS — Z01812 Encounter for preprocedural laboratory examination: Secondary | ICD-10-CM | POA: Diagnosis not present

## 2019-08-03 LAB — SARS CORONAVIRUS 2 (TAT 6-24 HRS): SARS Coronavirus 2: NEGATIVE

## 2019-08-05 ENCOUNTER — Ambulatory Visit (INDEPENDENT_AMBULATORY_CARE_PROVIDER_SITE_OTHER): Payer: BC Managed Care – PPO | Admitting: Obstetrics and Gynecology

## 2019-08-05 ENCOUNTER — Encounter: Payer: Self-pay | Admitting: Obstetrics and Gynecology

## 2019-08-05 VITALS — BP 129/86 | HR 104

## 2019-08-05 DIAGNOSIS — O98312 Other infections with a predominantly sexual mode of transmission complicating pregnancy, second trimester: Secondary | ICD-10-CM

## 2019-08-05 DIAGNOSIS — Z3A20 20 weeks gestation of pregnancy: Secondary | ICD-10-CM

## 2019-08-05 DIAGNOSIS — Z3480 Encounter for supervision of other normal pregnancy, unspecified trimester: Secondary | ICD-10-CM

## 2019-08-05 DIAGNOSIS — D563 Thalassemia minor: Secondary | ICD-10-CM | POA: Insufficient documentation

## 2019-08-05 DIAGNOSIS — B009 Herpesviral infection, unspecified: Secondary | ICD-10-CM

## 2019-08-05 IMAGING — DX LEFT ANKLE COMPLETE - 3+ VIEW
3 series · 3 of 3 positions shown · non-contrast
Comparison: None.

CLINICAL DATA: Twisting injury to the left ankle while jumping
rope. Left lateral ankle pain. Initial encounter.

EXAM:
LEFT ANKLE COMPLETE - 3+ VIEW

[ankle ap]
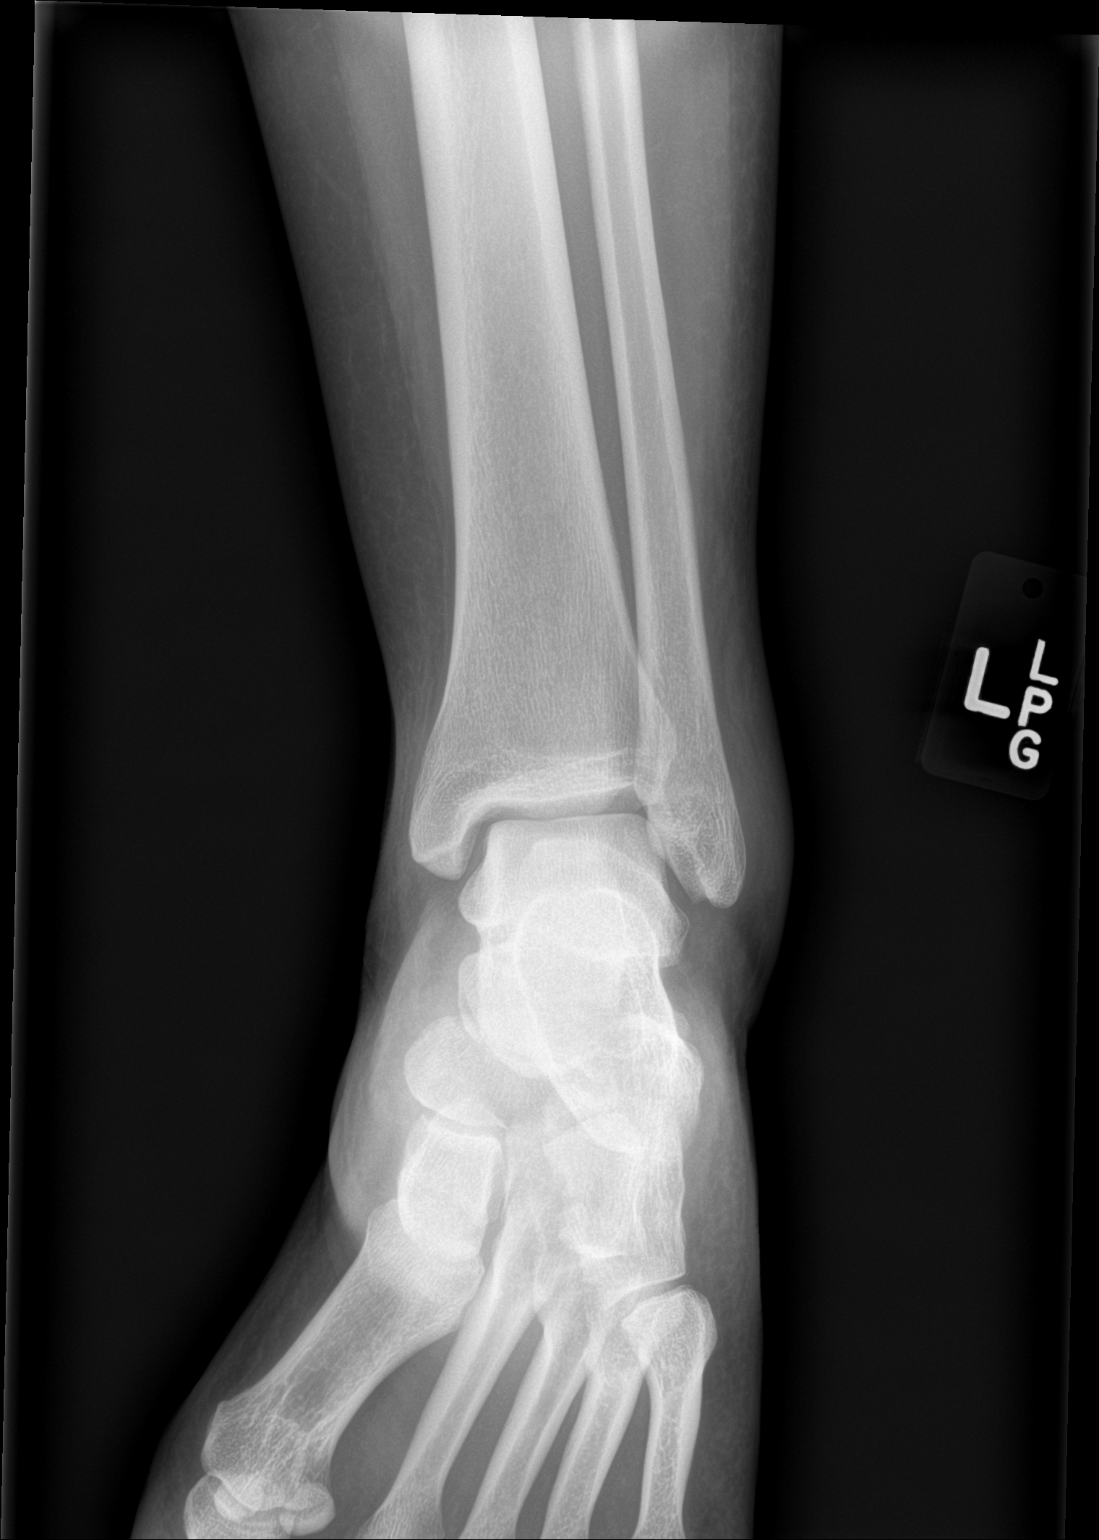

[ankle mortise]
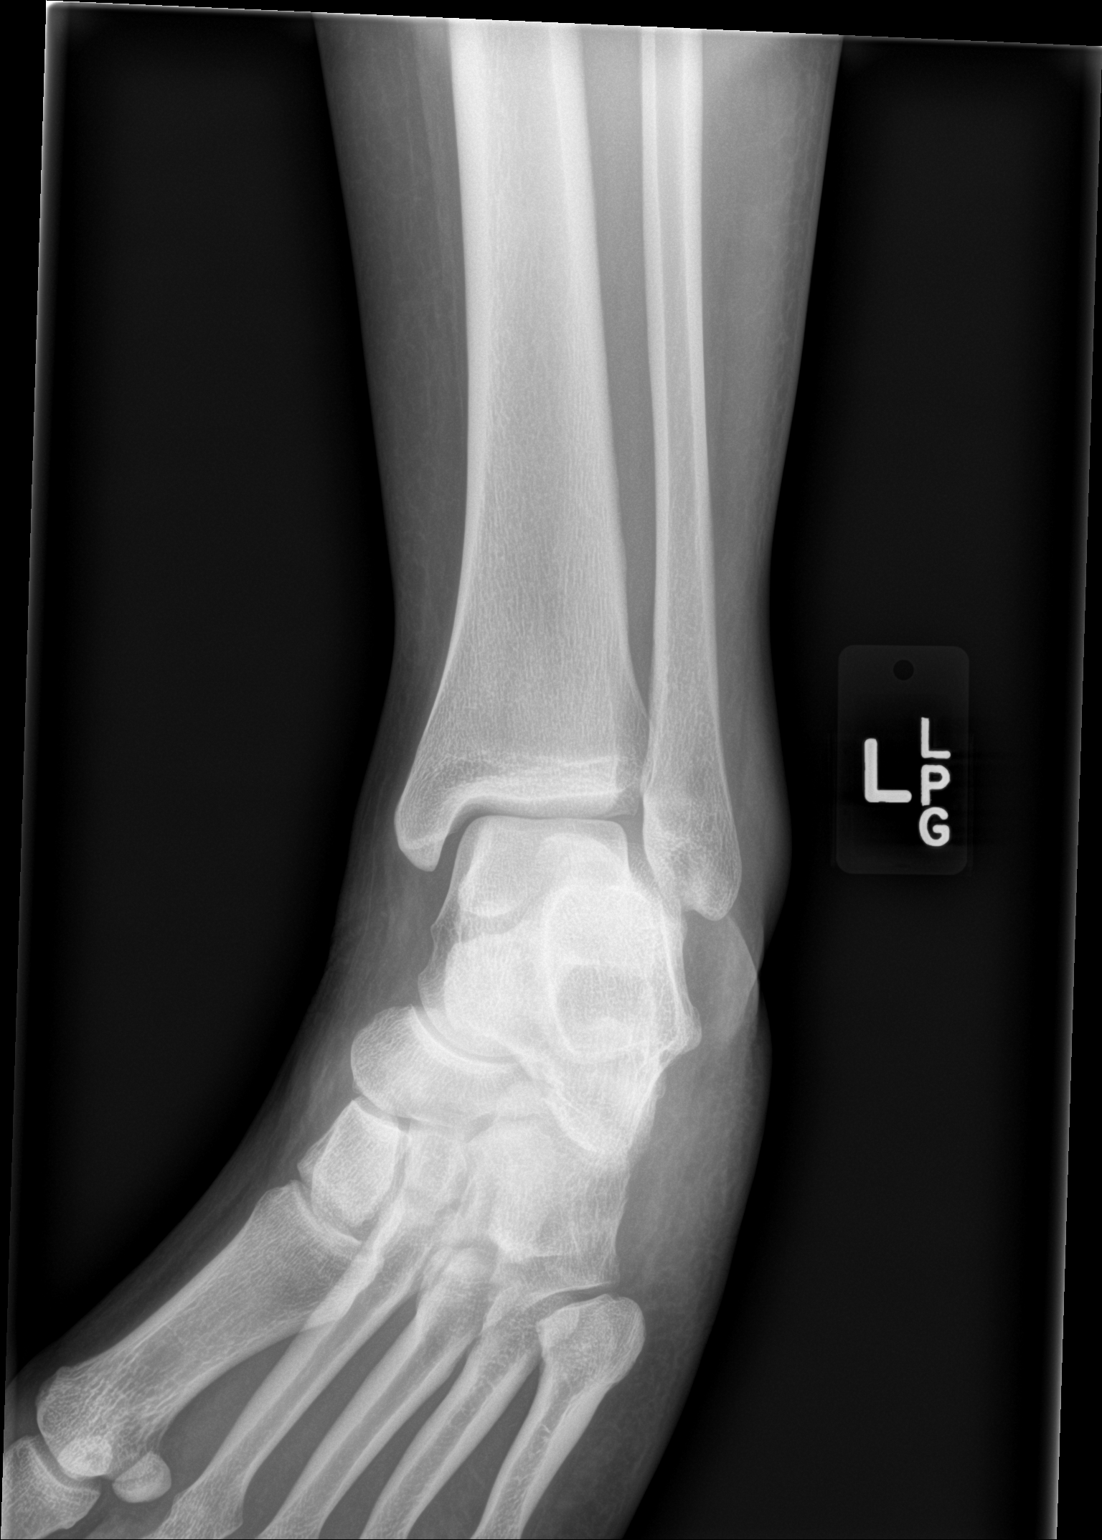

[ankle lat]
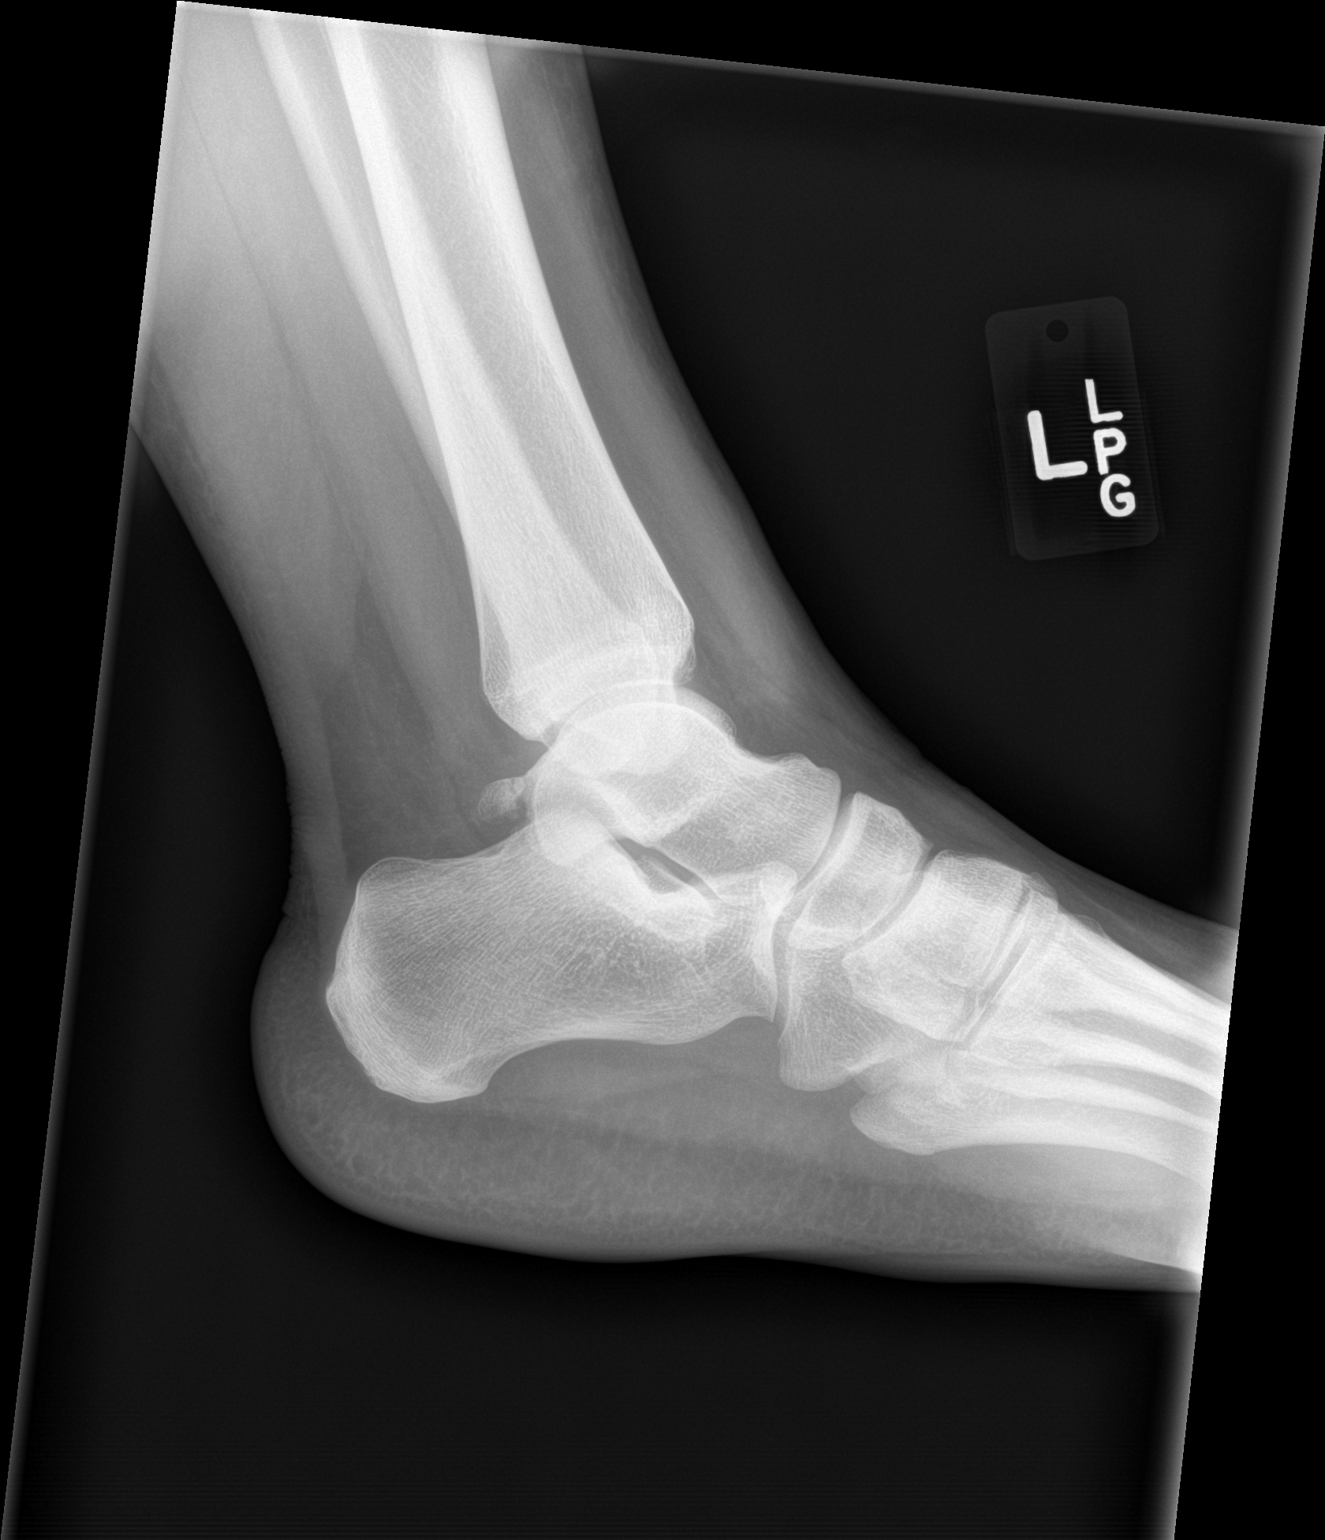

[3 of 3 positions shown; findings below may reference images not displayed]

FINDINGS: There is no evidence of fracture or dislocation. The ankle mortise
is intact; the interosseous space is within normal limits. No talar
tilt or subluxation is seen. An os trigonum is noted.

The joint spaces are preserved. Lateral soft tissue swelling is
noted at the ankle.
IMPRESSION: 1. No evidence of fracture or dislocation.
2. Os trigonum noted.

## 2019-08-05 NOTE — Progress Notes (Signed)
   TELEHEALTH OBSTETRICS PRENATAL VIRTUAL VIDEO VISIT ENCOUNTER NOTE  Provider location: Center for [facility] at [place]   I connected with Jessica Vasquez on 08/05/19 at  1:00 PM EDT by WebEx Video Encounter at home and verified that I am speaking with the correct person using two identifiers.   I discussed the limitations, risks, security and privacy concerns of performing an evaluation and management service virtually and the availability of in person appointments. I also discussed with the patient that there may be a patient responsible charge related to this service. The patient expressed understanding and agreed to proceed. Subjective:  Jessica Vasquez is a 28 y.o. [phone] at [redacted]w[redacted]d being seen today for ongoing prenatal care.  She is currently monitored for the following issues for this low-risk pregnancy and has Chronic cholecystitis with calculus; Anemia; HSV (herpes simplex virus) infection; BV (bacterial vaginosis); Choledocholithiasis; Supervision of other normal pregnancy, antepartum; Chronic nasal congestion; Nasal polyp; and Alpha thalassemia silent carrier on their problem list.  Patient reports no complaints.  Contractions: Not present. Vag. Bleeding: None.  Movement: Present. Denies any leaking of fluid.   The following portions of the patient's history were reviewed and updated as appropriate: allergies, current medications, past family history, past medical history, past social history, past surgical history and problem list.   Objective:   Vitals:   08/05/19 1309  BP: 129/86  Pulse: (!) 104    Fetal Status:     Movement: Present     General:  Alert, oriented and cooperative. Patient is in no acute distress.  Respiratory: Normal respiratory effort, no problems with respiration noted  Mental Status: Normal mood and affect. Normal behavior. Normal judgment and thought content.  Rest of physical exam deferred due to type of encounter  Imaging:   Assessment and Plan:   Pregnancy: [id number] at [redacted]w[redacted]d  1. Supervision of other normal pregnancy, antepartum Doing well Reviewed anatomy, incomplete, repeat scan 9/8  2. HSV (herpes simplex virus) infection ppx 34-36 weeks  Preterm labor symptoms and general obstetric precautions including but not limited to vaginal bleeding, contractions, leaking of fluid and fetal movement were reviewed in detail with the patient. I discussed the assessment and treatment plan with the patient. The patient was provided an opportunity to ask questions and all were answered. The patient agreed with the plan and demonstrated an understanding of the instructions. The patient was advised to call back or seek an in-person office evaluation/go to [facility] at [facility] for any urgent or concerning symptoms. Please refer to After Visit Summary for other counseling recommendations.   I provided 15 minutes of face-to-face time during this encounter.  Return in about 4 weeks (around 09/02/2019) for OB visit, virtual.  Future Appointments  Date Time Provider [place]  08/06/2019  8:00 PM Deneise Lever, MD [facility]  08/25/2019  3:45 PM [place], [place] for [place], [place]

## 2019-08-06 ENCOUNTER — Ambulatory Visit (HOSPITAL_BASED_OUTPATIENT_CLINIC_OR_DEPARTMENT_OTHER): Payer: BC Managed Care – PPO | Attending: Otolaryngology | Admitting: Internal Medicine

## 2019-08-06 ENCOUNTER — Other Ambulatory Visit: Payer: Self-pay

## 2019-08-06 DIAGNOSIS — G4733 Obstructive sleep apnea (adult) (pediatric): Secondary | ICD-10-CM | POA: Insufficient documentation

## 2019-08-06 DIAGNOSIS — R0683 Snoring: Secondary | ICD-10-CM

## 2019-08-09 DIAGNOSIS — R0683 Snoring: Secondary | ICD-10-CM

## 2019-08-09 NOTE — Procedures (Signed)
  Patient Name: Jessica Vasquez, Jessica Vasquez Study Date: 08/06/2019 Gender: Female D.O.B: 06/30/1991 Age (years): 28 Referring Provider: Amanda Marcellino MD Height (inches): 69 Interpreting Physician: Clinton Young MD, ABSM Weight (lbs): 250 RPSGT: Gregory, [place] BMI: 37 MRN: [id number] Neck Size: 16.50  CLINICAL INFORMATION Sleep Study Type: Split Night CPAP Indication for sleep study: Excessive Daytime Sleepiness, Fatigue, Morning Headaches, Obesity, Snoring Epworth Sleepiness Score: 18  SLEEP STUDY TECHNIQUE As per the AASM Manual for the Scoring of Sleep and Associated Events v2.3 (April 2016) with a hypopnea requiring 4% desaturations.  The channels recorded and monitored were frontal, central and occipital EEG, electrooculogram (EOG), submentalis EMG (chin), nasal and oral airflow, thoracic and abdominal wall motion, anterior tibialis EMG, snore microphone, electrocardiogram, and pulse oximetry. Continuous positive airway pressure (CPAP) was initiated when the patient met split night criteria and was titrated according to treat sleep-disordered breathing.  MEDICATIONS Medications self-administered by patient taken the night of the study : BENADRYL  RESPIRATORY PARAMETERS Diagnostic  Total AHI (/hr): 34.7 RDI (/hr): 46.7 OA Index (/hr): 1.9 CA Index (/hr): 0.5 REM AHI (/hr): 77.1 NREM AHI (/hr): 29.3 Supine AHI (/hr): 12.0 Non-supine AHI (/hr): 36.7 Min O2 Sat (%): 75.0 Mean O2 (%): 96.2 Time below 88% (min): 4.3   Titration  Optimal Pressure (cm): 12 AHI at Optimal Pressure (/hr): 0.0 Min O2 at Optimal Pressure (%): 97.0 Supine % at Optimal (%): 100 Sleep % at Optimal (%): 99   SLEEP ARCHITECTURE The recording time for the entire night was 376.3 minutes.  During a baseline period of 158.4 minutes, the patient slept for 124.5 minutes in REM and nonREM, yielding a sleep efficiency of 78.6%%. Sleep onset after lights out was 5.3 minutes with a REM latency of 65.0 minutes. The  patient spent 6.4%% of the night in stage N1 sleep, 72.3%% in stage N2 sleep, 10.0%% in stage N3 and 11.2% in REM.  During the titration period of 209.9 minutes, the patient slept for 206.5 minutes in REM and nonREM, yielding a sleep efficiency of 98.4%%. Sleep onset after CPAP initiation was 1.9 minutes with a REM latency of 11.0 minutes. The patient spent 2.2%% of the night in stage N1 sleep, 54.5%% in stage N2 sleep, 2.4%% in stage N3 and 40.9% in REM.  CARDIAC DATA The 2 lead EKG demonstrated sinus rhythm. The mean heart rate was 100.0 beats per minute. Other EKG findings include: None.  LEG MOVEMENT DATA The total Periodic Limb Movements of Sleep (PLMS) were 0. The PLMS index was 0.0 .  IMPRESSIONS - Severe obstructive sleep apnea occurred during the diagnostic portion of the study (AHI = 34.7/hour). An optimal PAP pressure was selected for this patient ( 12 cm of water) - No significant central sleep apnea occurred during the diagnostic portion of the study (CAI = 0.5/hour). - The patient had oxygen desaturation during the diagnostic portion of the study (Min O2 = 75.0%). Min sat at CPAP 12 was 97%. - The patient snored with moderate snoring volume during the diagnostic portion of the study. - No cardiac abnormalities were noted during this study. - Clinically significant periodic limb movements did not occur during sleep. - Patient is 6 months pregnant.  DIAGNOSIS - Obstructive Sleep Apnea (327.23 [G47.33 ICD-10])  RECOMMENDATIONS - Trial of CPAP therapy on 12 cm H2O or autopap 5-15. - Patient used a Large size Fisher&Paykel Full Face Mask F&P Vitera (new) mask and heated humidification. - Be careful with alcohol, sedatives and other CNS depressants that may worsen   sleep apnea and disrupt normal sleep architecture. - Sleep hygiene should be reviewed to assess factors that may improve sleep quality. - Weight management and regular exercise should be initiated or continued.   [Electronically signed] 08/09/2019 01:05 PM  Clinton Young MD, ABSM Diplomate, American Board of Sleep Medicine   NPI: [id number]                         Clinton Young Diplomate, American Board of Sleep Medicine  ELECTRONICALLY SIGNED ON:  08/09/2019, 1:02 PM [facility] PH: [phone]   FX: [phone] ACCREDITED BY THE AMERICAN ACADEMY OF SLEEP MEDICINE 

## 2019-08-25 ENCOUNTER — Ambulatory Visit (HOSPITAL_COMMUNITY)
Admission: RE | Admit: 2019-08-25 | Discharge: 2019-08-25 | Disposition: A | Discharge status: Home / Self Care | Payer: BC Managed Care – PPO | Source: Ambulatory Visit | Attending: Maternal & Fetal Medicine | Admitting: Maternal & Fetal Medicine

## 2019-08-25 ENCOUNTER — Other Ambulatory Visit: Payer: Self-pay

## 2019-08-25 DIAGNOSIS — Z3A23 23 weeks gestation of pregnancy: Secondary | ICD-10-CM

## 2019-08-25 DIAGNOSIS — O99212 Obesity complicating pregnancy, second trimester: Secondary | ICD-10-CM | POA: Diagnosis not present

## 2019-08-25 DIAGNOSIS — Z362 Encounter for other antenatal screening follow-up: Secondary | ICD-10-CM

## 2019-08-26 ENCOUNTER — Other Ambulatory Visit (HOSPITAL_COMMUNITY): Payer: Self-pay | Admitting: *Deleted

## 2019-08-26 DIAGNOSIS — Z362 Encounter for other antenatal screening follow-up: Secondary | ICD-10-CM

## 2019-09-02 ENCOUNTER — Telehealth (INDEPENDENT_AMBULATORY_CARE_PROVIDER_SITE_OTHER): Payer: BC Managed Care – PPO | Admitting: Obstetrics & Gynecology

## 2019-09-02 VITALS — BP 119/90

## 2019-09-02 DIAGNOSIS — Z3A24 24 weeks gestation of pregnancy: Secondary | ICD-10-CM

## 2019-09-02 DIAGNOSIS — Z3480 Encounter for supervision of other normal pregnancy, unspecified trimester: Secondary | ICD-10-CM

## 2019-09-02 DIAGNOSIS — Z3482 Encounter for supervision of other normal pregnancy, second trimester: Secondary | ICD-10-CM

## 2019-09-02 MED ORDER — PANTOPRAZOLE SODIUM 40 MG PO TBEC: 40.0000 mg | DELAYED_RELEASE_TABLET | Freq: Every day | ORAL | 1 refills | Status: DC

## 2019-09-02 NOTE — Progress Notes (Signed)
   TELEHEALTH VIRTUAL OBSTETRICS VISIT ENCOUNTER NOTE  I connected with Jessica Vasquez on 09/02/19 at  1:30 PM EDT by telephone at home and verified that I am speaking with the correct person using two identifiers.   I discussed the limitations, risks, security and privacy concerns of performing an evaluation and management service by telephone and the availability of in person appointments. I also discussed with the patient that there may be a patient responsible charge related to this service. The patient expressed understanding and agreed to proceed.  Subjective:  Jessica Vasquez is a 28 y.o. [id number] at [redacted]w[redacted]d being followed for ongoing prenatal care.  She is currently monitored for the following issues for this high-risk pregnancy and has Anemia; HSV (herpes simplex virus) infection; Supervision of other normal pregnancy, antepartum; Chronic nasal congestion; Nasal polyp; Alpha thalassemia silent carrier; and Snoring on their problem list.  Patient reports headache, heartburn and nausea. Reports fetal movement. Denies any contractions, bleeding or leaking of fluid.   The following portions of the patient's history were reviewed and updated as appropriate: allergies, current medications, past family history, past medical history, past social history, past surgical history and problem list.   Objective:   General:  Alert, oriented and cooperative.   Mental Status: Normal mood and affect perceived. Normal judgment and thought content.  Rest of physical exam deferred due to type of encounter  Assessment and Plan:  Pregnancy: [id number] at [redacted]w[redacted]d 1. Supervision of other normal pregnancy, antepartum Reflux and nausea - pantoprazole (PROTONIX) 40 MG tablet; Take 1 tablet (40 mg total) by mouth daily.  Dispense: 30 tablet; Refill: 1  Preterm labor symptoms and general obstetric precautions including but not limited to vaginal bleeding, contractions, leaking of fluid and fetal movement were reviewed in  detail with the patient.  I discussed the assessment and treatment plan with the patient. The patient was provided an opportunity to ask questions and all were answered. The patient agreed with the plan and demonstrated an understanding of the instructions. The patient was advised to call back or seek an in-person office evaluation/go to [facility]U at [facility] for any urgent or concerning symptoms. Please refer to After Visit Summary for other counseling recommendations.   I provided 12 minutes of non-face-to-face time during this encounter.  Return in about 4 weeks (around 09/30/2019) for in person 2 hr GTT.  Future Appointments  Date Time Provider [place]  09/29/2019  3:45 PM [facility] [place] 2 [facility]US    [name], [place] for [employer], [place]

## 2019-09-02 NOTE — Progress Notes (Signed)
Pt is on the phone preparing for virtual visit with provider. [redacted]w[redacted]d.

## 2019-09-02 NOTE — Patient Instructions (Signed)

## 2019-09-24 ENCOUNTER — Encounter (HOSPITAL_COMMUNITY): Payer: Self-pay | Admitting: *Deleted

## 2019-09-24 ENCOUNTER — Other Ambulatory Visit: Payer: Self-pay

## 2019-09-24 ENCOUNTER — Inpatient Hospital Stay (HOSPITAL_COMMUNITY)
Admission: AD | Admit: 2019-09-24 | Discharge: 2019-09-24 | Disposition: A | Discharge status: Home / Self Care | Payer: BC Managed Care – PPO | Attending: Obstetrics and Gynecology | Admitting: Obstetrics and Gynecology

## 2019-09-24 DIAGNOSIS — Z87891 Personal history of nicotine dependence: Secondary | ICD-10-CM | POA: Insufficient documentation

## 2019-09-24 DIAGNOSIS — Z3A27 27 weeks gestation of pregnancy: Secondary | ICD-10-CM | POA: Insufficient documentation

## 2019-09-24 DIAGNOSIS — O26892 Other specified pregnancy related conditions, second trimester: Secondary | ICD-10-CM | POA: Diagnosis not present

## 2019-09-24 DIAGNOSIS — R519 Headache, unspecified: Secondary | ICD-10-CM | POA: Insufficient documentation

## 2019-09-24 DIAGNOSIS — O99355 Diseases of the nervous system complicating the puerperium: Secondary | ICD-10-CM | POA: Insufficient documentation

## 2019-09-24 DIAGNOSIS — O162 Unspecified maternal hypertension, second trimester: Secondary | ICD-10-CM | POA: Insufficient documentation

## 2019-09-24 DIAGNOSIS — R600 Localized edema: Secondary | ICD-10-CM | POA: Insufficient documentation

## 2019-09-24 DIAGNOSIS — Z8249 Family history of ischemic heart disease and other diseases of the circulatory system: Secondary | ICD-10-CM | POA: Diagnosis not present

## 2019-09-24 DIAGNOSIS — Z833 Family history of diabetes mellitus: Secondary | ICD-10-CM | POA: Diagnosis not present

## 2019-09-24 DIAGNOSIS — J45909 Unspecified asthma, uncomplicated: Secondary | ICD-10-CM | POA: Insufficient documentation

## 2019-09-24 DIAGNOSIS — O99891 Other specified diseases and conditions complicating pregnancy: Secondary | ICD-10-CM | POA: Insufficient documentation

## 2019-09-24 DIAGNOSIS — Z9049 Acquired absence of other specified parts of digestive tract: Secondary | ICD-10-CM | POA: Insufficient documentation

## 2019-09-24 DIAGNOSIS — G4730 Sleep apnea, unspecified: Secondary | ICD-10-CM | POA: Diagnosis not present

## 2019-09-24 DIAGNOSIS — O99512 Diseases of the respiratory system complicating pregnancy, second trimester: Secondary | ICD-10-CM | POA: Insufficient documentation

## 2019-09-24 DIAGNOSIS — Z3689 Encounter for other specified antenatal screening: Secondary | ICD-10-CM | POA: Diagnosis not present

## 2019-09-24 DIAGNOSIS — Z3480 Encounter for supervision of other normal pregnancy, unspecified trimester: Secondary | ICD-10-CM

## 2019-09-24 LAB — URINALYSIS, ROUTINE W REFLEX MICROSCOPIC
Bilirubin Urine: NEGATIVE
Glucose, UA: NEGATIVE mg/dL
Hgb urine dipstick: NEGATIVE
Ketones, ur: NEGATIVE mg/dL
Nitrite: NEGATIVE
Protein, ur: NEGATIVE mg/dL
Specific Gravity, Urine: 1.02 (ref 1.005–1.030)
pH: 6 (ref 5.0–8.0)

## 2019-09-24 LAB — COMPREHENSIVE METABOLIC PANEL
ALT: 20 U/L (ref 0–44)
AST: 27 U/L (ref 15–41)
Albumin: 2.4 g/dL — ABNORMAL LOW (ref 3.5–5.0)
Alkaline Phosphatase: 55 U/L (ref 38–126)
Anion gap: 9 (ref 5–15)
BUN: <5 mg/dL — ABNORMAL LOW (ref 6–20)
CO2: 19 mmol/L — ABNORMAL LOW (ref 22–32)
Calcium: 8.5 mg/dL — ABNORMAL LOW (ref 8.9–10.3)
Chloride: 106 mmol/L (ref 98–111)
Creatinine, Ser: 0.65 mg/dL (ref 0.44–1.00)
GFR calc Af Amer: >60 mL/min (ref >60)
GFR calc non Af Amer: >60 mL/min (ref >60)
Glucose, Bld: 90 mg/dL (ref 70–99)
Potassium: 3.7 mmol/L (ref 3.5–5.1)
Sodium: 134 mmol/L — ABNORMAL LOW (ref 135–145)
Total Bilirubin: 0.3 mg/dL (ref 0.3–1.2)
Total Protein: 5.4 g/dL — ABNORMAL LOW (ref 6.5–8.1)

## 2019-09-24 LAB — CBC
HCT: 28.6 % — ABNORMAL LOW (ref 36.0–46.0)
Hemoglobin: 9.6 g/dL — ABNORMAL LOW (ref 12.0–15.0)
MCH: 29.2 pg (ref 26.0–34.0)
MCHC: 33.6 g/dL (ref 30.0–36.0)
MCV: 86.9 fL (ref 80.0–100.0)
Platelets: 191 10*3/uL (ref 150–400)
RBC: 3.29 MIL/uL — ABNORMAL LOW (ref 3.87–5.11)
RDW: 12.9 % (ref 11.5–15.5)
WBC: 8.8 10*3/uL (ref 4.0–10.5)
nRBC: 0 % (ref 0.0–0.2)

## 2019-09-24 LAB — PROTEIN / CREATININE RATIO, URINE
Creatinine, Urine: 179.46 mg/dL
Protein Creatinine Ratio: 0.1 mg/mg{Cre} (ref 0.00–0.15)
Total Protein, Urine: 18 mg/dL

## 2019-09-24 MED ORDER — ACETAMINOPHEN 500 MG PO TABS
1000.0000 mg | ORAL_TABLET | Freq: Once | ORAL | Status: AC
  Administered 2019-09-24: 1000 mg via ORAL
  Filled 2019-09-24: qty 2

## 2019-09-24 NOTE — MAU Provider Note (Signed)
History     CSN: [id number]  Arrival date and time: 09/24/19 1522   First Provider Initiated Contact with Patient 09/24/19 1643      Chief Complaint  Patient presents with  . Hypertension  . Leg Swelling   HPI Jessica Vasquez is a 28 y.o. [id number] at 109w2dwho presents to MAU with multiple complaints. She denies vaginal bleeding, leaking of fluid, decreased fetal movement, fever, falls, or recent illness.   Headache This is a recurrent problem. Patient states "I just always have some sort of a headache". Her current pain is anterior, bilateral and does not radiate. She rates her pain as 5/10. She has not taken medication or tried other treatments for this complaint. She has Fioricet at home but prefers to reserve it for more intense pain.   Visual disturbances Patient denies visual disturbances today but endorses one brief episode of "seeing spots" yesterday. She denies other episodes of visual disturbances this pregnancy. She denies history of migraine with aura.  Elevated blood pressure Patient endorses new onset elevated blood pressure of 133/99 on her home cuff. She denies history of elevated blood pressure. She is not currently taking medication other than her prenatal.  Swelling in extremities and face This is a new problem. Patient states she was visiting with her sister this morning and her sister told her that her face looked swollen. Patient denies unilateral swelling, calf pain, lower extremity skin discoloration.  OB History    Gravida  4   Para  2   Term  2   Preterm  0   AB  1   Living  2     SAB  1   TAB  0   Ectopic  0   Multiple  0   Live Births  2           Past Medical History:  Diagnosis Date  . Anemia   . Asthma   . Cholelithiasis   . Headache   . Herpes    last outbreak two weeks ago ( mid march 2015)  . Hydradenitis   . Sleep apnea   . Trichimoniasis 01/14/2019    Past Surgical History:  Procedure Laterality Date  .  CHOLECYSTECTOMY N/A 10/15/2015   Procedure: LAPAROSCOPIC CHOLECYSTECTOMY WITH INTRAOPERATIVE CHOLANGIOGRAM;  Surgeon: MDonnie Mesa MD;  [facility]  Service: General;  Laterality: N/A;  . ERCP N/A 10/16/2015   Procedure: ENDOSCOPIC RETROGRADE CHOLANGIOPANCREATOGRAPHY (ERCP);  Surgeon: CGatha Mayer MD;  [facility];  Service: Endoscopy;  Laterality: N/A;  . LAPAROSCOPIC CHOLECYSTECTOMY W/ CHOLANGIOGRAPHY    . nasal sugery      Family History  Problem Relation Age of Onset  . Gallstones Mother   . Arthritis Mother   . ADD / ADHD Brother   . Diabetes Maternal Grandmother   . Heart disease Maternal Grandmother   . Hypertension Maternal Grandmother   . Stroke Maternal Grandmother     Social History   Tobacco Use  . Smoking status: Former Smoker    Quit date: 05/16/2011    Years since quitting: 8.3  . Smokeless tobacco: Never Used  Substance Use Topics  . Alcohol use: No  . Drug use: No    Allergies: No Known Allergies  Medications Prior to Admission  Medication Sig Dispense Refill Last Dose  . Blood Pressure Monitoring KIT 1 kit by Does not apply route once a week. 1 kit 0 09/24/2019 at Unknown time  . Butalbital-APAP-Caffeine 50-325-40 MG capsule Take 1-2  capsules by mouth every 6 (six) hours as needed for headache. 30 capsule 3 Past Week at Unknown time  . ondansetron (ZOFRAN ODT) 4 MG disintegrating tablet Take 1 tablet (4 mg total) by mouth every 8 (eight) hours as needed for nausea or vomiting. 20 tablet 5 Past Week at Unknown time  . pantoprazole (PROTONIX) 40 MG tablet Take 1 tablet (40 mg total) by mouth daily. 30 tablet 1 Past Week at Unknown time  . Prenatal Vit-Fe Fumarate-FA (PREPLUS) 27-1 MG TABS Take 1 tablet by mouth daily. 30 tablet 13 09/24/2019 at Unknown time  . promethazine (PHENERGAN) 12.5 MG tablet Take 1 tablet (12.5 mg total) by mouth every 6 (six) hours as needed for nausea or vomiting. 30 tablet 1 Past Week at Unknown time  .  cetirizine-pseudoephedrine (ZYRTEC-D) 5-120 MG tablet Take 1 tablet by mouth as needed for allergies.     Marland Kitchen terconazole (TERAZOL 7) 0.4 % vaginal cream Place 1 applicator vaginally at bedtime. (Patient not taking: Reported on 08/05/2019) 45 g 0     Review of Systems  Constitutional: Negative for chills, fatigue and fever.  Eyes: Negative for visual disturbance.  Respiratory: Negative for shortness of breath.   Gastrointestinal: Negative for abdominal pain.  Genitourinary: Negative for dysuria, vaginal bleeding, vaginal discharge and vaginal pain.  Musculoskeletal: Negative for back pain.  Neurological: Positive for headaches. Negative for syncope and weakness.  All other systems reviewed and are negative.  Physical Exam   Blood pressure 132/88, pulse 94, temperature 98.2 F (36.8 C), resp. rate 18, height _0  (1.778 m), weight 120.7 kg, last menstrual period 03/17/2019, currently breastfeeding.  Physical Exam  Nursing note and vitals reviewed. Constitutional: She is oriented to person, place, and time. She appears well-developed and well-nourished.  Cardiovascular: Normal rate and normal heart sounds.  Respiratory: Effort normal and breath sounds normal. No respiratory distress.  GI: She exhibits no distension. There is no abdominal tenderness. There is no rebound, no guarding and no CVA tenderness.  Gravid  Musculoskeletal:     Comments: Trace edema bilaterally in lower extremities  Neurological: She is alert and oriented to person, place, and time. She has normal strength. No sensory deficit.  Skin: Skin is warm and dry.  Psychiatric: She has a normal mood and affect. Her behavior is normal. Judgment and thought content normal.    MAU Course/MDM  Procedures  --One true BP elevation in MAU. BP in chart 131/72 in MAU early pregnancy on 04/26/2019, 130/87 at new ob 06/24/2019 --Normal PEC labs, headache resolved with Tylenol. C/o headache dating back to 07/10/19, not  intractable --Reactive fetal tracing: baseline 145, moderate variability, positive accels, no decels  --Toco: quiet  Patient Vitals for the past 24 hrs:  BP Temp Pulse Resp Height Weight  09/24/19 1747 (!) 108/56 - 72 - - -  09/24/19 1731 135/82 - 66 - - -  09/24/19 1716 130/77 - 73 - - -  09/24/19 1701 131/75 - 79 - - -  09/24/19 1646 (!) 126/95 - - - - -  09/24/19 1639 135/87 - - - - -  09/24/19 1617 132/88 98.2 F (36.8 C) 94 18 _1  (1.778 m) 120.7 kg   Orders Placed This Encounter  Procedures  . Urinalysis, Routine w reflex microscopic  . Protein / creatinine ratio, urine  . CBC  . Comprehensive metabolic panel  . Measure blood pressure  . Discharge patient   Meds ordered this encounter  Medications  . acetaminophen (TYLENOL) tablet  1,000 mg   Results for orders placed or performed during the hospital encounter of 09/24/19 (from the past 24 hour(s))  Urinalysis, Routine w reflex microscopic     Status: Abnormal   Collection Time: 09/24/19  4:22 PM  Result Value Ref Range   Color, Urine YELLOW YELLOW   APPearance CLOUDY (A) CLEAR   Specific Gravity, Urine 1.020 1.005 - 1.030   pH 6.0 5.0 - 8.0   Glucose, UA NEGATIVE NEGATIVE mg/dL   Hgb urine dipstick NEGATIVE NEGATIVE   Bilirubin Urine NEGATIVE NEGATIVE   Ketones, ur NEGATIVE NEGATIVE mg/dL   Protein, ur NEGATIVE NEGATIVE mg/dL   Nitrite NEGATIVE NEGATIVE   Leukocytes,Ua LARGE (A) NEGATIVE   RBC / HPF 0-5 0 - 5 RBC/hpf   WBC, UA 11-20 0 - 5 WBC/hpf   Bacteria, UA FEW (A) NONE SEEN   Squamous Epithelial / LPF 21-50 0 - 5   Mucus PRESENT   Protein / creatinine ratio, urine     Status: None   Collection Time: 09/24/19  4:22 PM  Result Value Ref Range   Creatinine, Urine 179.46 mg/dL   Total Protein, Urine 18 mg/dL   Protein Creatinine Ratio 0.10 0.00 - 0.15 mg/mg[Cre]  CBC     Status: Abnormal   Collection Time: 09/24/19  5:11 PM  Result Value Ref Range   WBC 8.8 4.0 - 10.5 K/uL   RBC 3.29 (L) 3.87 - 5.11  MIL/uL   Hemoglobin 9.6 (L) 12.0 - 15.0 g/dL   HCT 28.6 (L) 36.0 - 46.0 %   MCV 86.9 80.0 - 100.0 fL   MCH 29.2 26.0 - 34.0 pg   MCHC 33.6 30.0 - 36.0 g/dL   RDW 12.9 11.5 - 15.5 %   Platelets 191 150 - 400 K/uL   nRBC 0.0 0.0 - 0.2 %  Comprehensive metabolic panel     Status: Abnormal   Collection Time: 09/24/19  5:11 PM  Result Value Ref Range   Sodium 134 (L) 135 - 145 mmol/L   Potassium 3.7 3.5 - 5.1 mmol/L   Chloride 106 98 - 111 mmol/L   CO2 19 (L) 22 - 32 mmol/L   Glucose, Bld 90 70 - 99 mg/dL   BUN <5 (L) 6 - 20 mg/dL   Creatinine, Ser 0.65 0.44 - 1.00 mg/dL   Calcium 8.5 (L) 8.9 - 10.3 mg/dL   Total Protein 5.4 (L) 6.5 - 8.1 g/dL   Albumin 2.4 (L) 3.5 - 5.0 g/dL   AST 27 15 - 41 U/L   ALT 20 0 - 44 U/L   Alkaline Phosphatase 55 38 - 126 U/L   Total Bilirubin 0.3 0.3 - 1.2 mg/dL   GFR calc non Af Amer >60 >60 mL/min   GFR calc Af Amer >60 >60 mL/min   Anion gap 9 5 - 15    Assessment and Plan  --28 y.o. [id number] at [redacted]w[redacted]d --Reactive tracing --Elevated BP x 1, normal PEC labs --Trace edema, continue PO hydration with water, elevate legs when at rest, consider compression socks --Discharge home in stable condition  F[facility]U: [facility] Message sent to [facility]  SDarlina Rumpf CNM 09/24/2019, 7:36 PM

## 2019-09-24 NOTE — MAU Note (Signed)
Pt c/o increased swelling in her hand feet and face over the past few days. Took b/p at home and it was 133/99. Pt stated she always has a headache but also reports seeing spots/flashes.

## 2019-09-24 NOTE — Discharge Instructions (Signed)
Preventing Hypertension Hypertension, commonly called high blood pressure, is when the force of blood pumping through the arteries is too strong. Arteries are blood vessels that carry blood from the heart throughout the body. Over time, hypertension can damage the arteries and decrease blood flow to important parts of the body, including the brain, heart, and kidneys. Often, hypertension does not cause symptoms until blood pressure is very high. For this reason, it is important to have your blood pressure checked on a regular basis. Hypertension can often be prevented with diet and lifestyle changes. If you already have hypertension, you can control it with diet and lifestyle changes, as well as medicine. What nutrition changes can be made? Maintain a healthy diet. This includes:  Eating less salt (sodium). Ask your health care provider how much sodium is safe for you to have. The general recommendation is to consume less than 1 tsp (2,300 mg) of sodium a day. ? Do not add salt to your food. ? Choose low-sodium options when grocery shopping and eating out.  Limiting fats in your diet. You can do this by eating low-fat or fat-free dairy products and by eating less red meat.  Eating more fruits, vegetables, and whole grains. Make a goal to eat: ? 1-2 cups of fresh fruits and vegetables each day. ? 3-4 servings of whole grains each day.  Avoiding foods and beverages that have added sugars.  Eating fish that contain healthy fats (omega-3 fatty acids), such as mackerel or salmon. If you need help putting together a healthy eating plan, try the DASH diet. This diet is high in fruits, vegetables, and whole grains. It is low in sodium, red meat, and added sugars. DASH stands for Dietary Approaches to Stop Hypertension. What lifestyle changes can be made?   Lose weight if you are overweight. Losing just 3?5% of your body weight can help prevent or control hypertension. ? For example, if your present  weight is 200 lb (91 kg), a loss of 3-5% of your weight means losing 6-10 lb (2.7-4.5 kg). ? Ask your health care provider to help you with a diet and exercise plan to safely lose weight.  Get enough exercise. Do at least 150 minutes of moderate-intensity exercise each week. ? You could do this in short exercise sessions several times a day, or you could do longer exercise sessions a few times a week. For example, you could take a brisk 10-minute walk or bike ride, 3 times a day, for 5 days a week.  Find ways to reduce stress, such as exercising, meditating, listening to music, or taking a yoga class. If you need help reducing stress, ask your health care provider.  Do not smoke. This includes e-cigarettes. Chemicals in tobacco and nicotine products raise your blood pressure each time you smoke. If you need help quitting, ask your health care provider.  Avoid alcohol. If you drink alcohol, limit alcohol intake to no more than 1 drink a day for nonpregnant women and 2 drinks a day for men. One drink equals 12 oz of beer, 5 oz of wine, or 1 oz of hard liquor. Why are these changes important? Diet and lifestyle changes can help you prevent hypertension, and they may make you feel better overall and improve your quality of life. If you have hypertension, making these changes will help you control it and help prevent major complications, such as:  Hardening and narrowing of arteries that supply blood to: ? Your heart. This can cause a heart   attack. ? Your brain. This can cause a stroke. ? Your kidneys. This can cause kidney failure.  Stress on your heart muscle, which can cause heart failure. What can I do to lower my risk?  Work with your health care provider to make a hypertension prevention plan that works for you. Follow your plan and keep all follow-up visits as told by your health care provider.  Learn how to check your blood pressure at home. Make sure that you know your personal target  blood pressure, as told by your health care provider. How is this treated? In addition to diet and lifestyle changes, your health care provider may recommend medicines to help lower your blood pressure. You may need to try a few different medicines to find what works best for you. You also may need to take more than one medicine. Take over-the-counter and prescription medicines only as told by your health care provider. Where to find support Your health care provider can help you prevent hypertension and help you keep your blood pressure at a healthy level. Your local hospital or your community may also provide support services and prevention programs. The American Heart Association offers an online support network at: http://supportnetwork.heart.org/high-blood-pressure Where to find more information Learn more about hypertension from:  National Heart, Lung, and Blood Institute: www.nhlbi.nih.gov/health/health-topics/topics/hbp  Centers for Disease Control and Prevention: www.cdc.gov/bloodpressure  American Academy of Family Physicians: http://familydoctor.org/familydoctor/en/diseases-conditions/high-blood-pressure.printerview.all.html Learn more about the DASH diet from:  National Heart, Lung, and Blood Institute: www.nhlbi.nih.gov/health/health-topics/topics/dash Contact a health care provider if:  You think you are having a reaction to medicines you have taken.  You have recurrent headaches or feel dizzy.  You have swelling in your ankles.  You have trouble with your vision. Summary  Hypertension often does not cause any symptoms until blood pressure is very high. It is important to get your blood pressure checked regularly.  Diet and lifestyle changes are the most important steps in preventing hypertension.  By keeping your blood pressure in a healthy range, you can prevent complications like heart attack, heart failure, stroke, and kidney failure.  Work with your health care  provider to make a hypertension prevention plan that works for you. This information is not intended to replace advice given to you by your health care provider. Make sure you discuss any questions you have with your health care provider. Document Released: 12/18/2015 Document Revised: 03/27/2019 Document Reviewed: 08/13/2016 Elsevier Patient Education  2020 Elsevier Inc.  

## 2019-09-25 ENCOUNTER — Other Ambulatory Visit: Payer: Self-pay | Admitting: Obstetrics & Gynecology

## 2019-09-25 DIAGNOSIS — Z3480 Encounter for supervision of other normal pregnancy, unspecified trimester: Secondary | ICD-10-CM

## 2019-09-25 LAB — CULTURE, OB URINE

## 2019-09-29 ENCOUNTER — Other Ambulatory Visit: Payer: Self-pay

## 2019-09-29 ENCOUNTER — Ambulatory Visit (HOSPITAL_COMMUNITY)
Admission: RE | Admit: 2019-09-29 | Discharge: 2019-09-29 | Disposition: A | Discharge status: Home / Self Care | Payer: BC Managed Care – PPO | Source: Ambulatory Visit | Attending: Obstetrics and Gynecology | Admitting: Obstetrics and Gynecology

## 2019-09-29 DIAGNOSIS — O99213 Obesity complicating pregnancy, third trimester: Secondary | ICD-10-CM

## 2019-09-29 DIAGNOSIS — Z362 Encounter for other antenatal screening follow-up: Secondary | ICD-10-CM | POA: Diagnosis not present

## 2019-09-29 DIAGNOSIS — O99013 Anemia complicating pregnancy, third trimester: Secondary | ICD-10-CM

## 2019-09-29 DIAGNOSIS — Z148 Genetic carrier of other disease: Secondary | ICD-10-CM | POA: Diagnosis not present

## 2019-09-29 DIAGNOSIS — Z3A28 28 weeks gestation of pregnancy: Secondary | ICD-10-CM

## 2019-09-30 ENCOUNTER — Encounter: Payer: Self-pay | Admitting: Obstetrics and Gynecology

## 2019-09-30 ENCOUNTER — Ambulatory Visit (INDEPENDENT_AMBULATORY_CARE_PROVIDER_SITE_OTHER): Payer: BC Managed Care – PPO | Admitting: Obstetrics and Gynecology

## 2019-09-30 ENCOUNTER — Other Ambulatory Visit: Payer: BC Managed Care – PPO

## 2019-09-30 VITALS — BP 128/89 | HR 79 | Wt 267.8 lb

## 2019-09-30 DIAGNOSIS — O139 Gestational [pregnancy-induced] hypertension without significant proteinuria, unspecified trimester: Secondary | ICD-10-CM

## 2019-09-30 DIAGNOSIS — B009 Herpesviral infection, unspecified: Secondary | ICD-10-CM

## 2019-09-30 DIAGNOSIS — D508 Other iron deficiency anemias: Secondary | ICD-10-CM

## 2019-09-30 DIAGNOSIS — O98313 Other infections with a predominantly sexual mode of transmission complicating pregnancy, third trimester: Secondary | ICD-10-CM

## 2019-09-30 DIAGNOSIS — O133 Gestational [pregnancy-induced] hypertension without significant proteinuria, third trimester: Secondary | ICD-10-CM

## 2019-09-30 DIAGNOSIS — Z3A28 28 weeks gestation of pregnancy: Secondary | ICD-10-CM

## 2019-09-30 DIAGNOSIS — Z3480 Encounter for supervision of other normal pregnancy, unspecified trimester: Secondary | ICD-10-CM

## 2019-09-30 HISTORY — DX: Gestational (pregnancy-induced) hypertension without significant proteinuria, unspecified trimester: O13.9

## 2019-09-30 MED ORDER — FERROUS SULFATE 325 (65 FE) MG PO TBEC: 325.0000 mg | DELAYED_RELEASE_TABLET | Freq: Three times a day (TID) | ORAL | 3 refills | Status: DC

## 2019-09-30 NOTE — Progress Notes (Signed)
Pt is here for ROB and [redacted]w[redacted]d.

## 2019-09-30 NOTE — Progress Notes (Addendum)
   PRENATAL VISIT NOTE  Subjective:  Jessica Vasquez is a 28 y.o. [phone] at [redacted]w[redacted]d being seen today for ongoing prenatal care.  She is currently monitored for the following issues for this high-risk pregnancy and has Anemia; HSV (herpes simplex virus) infection; Supervision of other normal pregnancy, antepartum; Chronic nasal congestion; Nasal polyp; Alpha thalassemia silent carrier; Snoring; and Gestational hypertension on their problem list.  Patient reports no complaints.  Contractions: Not present. Vag. Bleeding: None.  Movement: Present. Denies leaking of fluid.   The following portions of the patient's history were reviewed and updated as appropriate: allergies, current medications, past family history, past medical history, past social history, past surgical history and problem list.   Objective:   Vitals:   09/30/19 0831 09/30/19 0834  BP: (!) 139/94 128/89  Pulse: 81 79  Weight: 267 lb 12.8 oz (121.5 kg)     Fetal Status: Fetal Heart Rate (bpm): 150   Movement: Present     General:  Alert, oriented and cooperative. Patient is in no acute distress.  Skin: Skin is warm and dry. No rash noted.   Cardiovascular: Normal heart rate noted  Respiratory: Normal respiratory effort, no problems with respiration noted  Abdomen: Soft, gravid, appropriate for gestational age.  Pain/Pressure: Absent     Pelvic: Cervical exam deferred        Extremities: Normal range of motion.  Edema: Trace  Mental Status: Normal mood and affect. Normal behavior. Normal judgment and thought content.   Assessment and Plan:  Pregnancy: [id number] at [redacted]w[redacted]d  1. Supervision of other normal pregnancy, antepartum - Glucose Tolerance, 2 Hours w/1 Hour - CBC - RPR - HIV Antibody (routine testing w rflx) - declined Flu - declined Tdap  2. HSV (herpes simplex virus) infection ppx 32-34 weeks  3. Gestational hypertension, third trimester Dx @ 28 weeks visit Will need IOL @ 37 weeks Reviewed with patient, she  verbalizes understanding  4. Other iron deficiency anemia Ferrous sulfate TID   Preterm labor symptoms and general obstetric precautions including but not limited to vaginal bleeding, contractions, leaking of fluid and fetal movement were reviewed in detail with the patient. Please refer to After Visit Summary for other counseling recommendations.   Return in about 2 weeks (around 10/14/2019) for high OB, virtual.  No future appointments.  Sloan Leiter, MD

## 2019-09-30 NOTE — Patient Instructions (Signed)
HOW TO TAKE YOUR BLOOD PRESSURE AT HOME ° °Take your blood pressure at home. Pick one day a week and take your blood pressure consistently every week on this day. Relax quietly for about 15 minutes then take your blood pressure. DO NOT take it when you are upset, stressed, or have just been active. Make sure your blood pressure cuff fits appropriately, there should be measurements on the box and on the cuff to help guide you.  ° °Once you have taken your blood pressure, look at the numbers. If the top number (systolic) is equal to or above 140, please call the office and let us know. If the bottom number (diastolic) is equal to or above 90, please call the office and let us know.  ° °If you have any questions or are concerned you are not taking your blood pressure correctly, please call the office! ° °If you have a headache that does not improve, problems with your vision, abdominal pain or other concerns, please call and let us know. Please go to the hospital with any severe symptoms. Please go to the hospital for labor, painful contractions every 5 minutes or less, leaking of fluid, or if you have not felt normal fetal movement.  ° °

## 2019-10-01 ENCOUNTER — Telehealth: Payer: Self-pay

## 2019-10-01 ENCOUNTER — Encounter: Payer: Self-pay | Admitting: Obstetrics and Gynecology

## 2019-10-01 DIAGNOSIS — R7309 Other abnormal glucose: Secondary | ICD-10-CM

## 2019-10-01 DIAGNOSIS — O24419 Gestational diabetes mellitus in pregnancy, unspecified control: Secondary | ICD-10-CM | POA: Insufficient documentation

## 2019-10-01 LAB — COMPREHENSIVE METABOLIC PANEL
ALT: 24 IU/L (ref 0–32)
AST: 28 IU/L (ref 0–40)
Albumin/Globulin Ratio: 1.4 (ref 1.2–2.2)
Albumin: 3.4 g/dL — ABNORMAL LOW (ref 3.9–5.0)
Alkaline Phosphatase: 79 IU/L (ref 39–117)
BUN/Creatinine Ratio: 6 — ABNORMAL LOW (ref 9–23)
BUN: 4 mg/dL — ABNORMAL LOW (ref 6–20)
Bilirubin Total: <0.2 mg/dL (ref 0.0–1.2)
CO2: 20 mmol/L (ref 20–29)
Calcium: 9 mg/dL (ref 8.7–10.2)
Chloride: 103 mmol/L (ref 96–106)
Creatinine, Ser: 0.67 mg/dL (ref 0.57–1.00)
GFR calc Af Amer: 138 mL/min/{1.73_m2} (ref >59)
GFR calc non Af Amer: 120 mL/min/{1.73_m2} (ref >59)
Globulin, Total: 2.4 g/dL (ref 1.5–4.5)
Glucose: 149 mg/dL — ABNORMAL HIGH (ref 65–99)
Potassium: 4 mmol/L (ref 3.5–5.2)
Sodium: 136 mmol/L (ref 134–144)
Total Protein: 5.8 g/dL — ABNORMAL LOW (ref 6.0–8.5)

## 2019-10-01 LAB — CBC
Hematocrit: 30.9 % — ABNORMAL LOW (ref 34.0–46.6)
Hemoglobin: 10.3 g/dL — ABNORMAL LOW (ref 11.1–15.9)
MCH: 28.6 pg (ref 26.6–33.0)
MCHC: 33.3 g/dL (ref 31.5–35.7)
MCV: 86 fL (ref 79–97)
Platelets: 207 10*3/uL (ref 150–450)
RBC: 3.6 x10E6/uL — ABNORMAL LOW (ref 3.77–5.28)
RDW: 12.8 % (ref 11.7–15.4)
WBC: 9.5 10*3/uL (ref 3.4–10.8)

## 2019-10-01 LAB — GLUCOSE TOLERANCE, 2 HOURS W/ 1HR
Glucose, 1 hour: 145 mg/dL (ref 65–179)
Glucose, 2 hour: 178 mg/dL — ABNORMAL HIGH (ref 65–152)
Glucose, Fasting: 93 mg/dL — ABNORMAL HIGH (ref 65–91)

## 2019-10-01 LAB — HIV ANTIBODY (ROUTINE TESTING W REFLEX): HIV Screen 4th Generation wRfx: NONREACTIVE

## 2019-10-01 LAB — RPR: RPR Ser Ql: NONREACTIVE

## 2019-10-01 NOTE — Telephone Encounter (Signed)
-----   Message from Sloan Leiter, MD sent at 10/01/2019 10:46 AM EDT ----- Please let patient know she had elevated GTT and get her set up with diabetic counselor. Thanks.

## 2019-10-01 NOTE — Telephone Encounter (Signed)
Patient called and made aware of elevated gtt results. Made patient aware she is gestational diabetic and we will be sending her to diabetic educator for class. Patient states understanding. Kathrene Alu RN

## 2019-10-08 ENCOUNTER — Encounter (HOSPITAL_COMMUNITY): Payer: Self-pay

## 2019-10-08 ENCOUNTER — Ambulatory Visit (HOSPITAL_COMMUNITY)
Admission: RE | Admit: 2019-10-08 | Discharge: 2019-10-08 | Disposition: A | Discharge status: Home / Self Care | Payer: BC Managed Care – PPO | Source: Ambulatory Visit | Attending: Obstetrics and Gynecology | Admitting: Obstetrics and Gynecology

## 2019-10-08 ENCOUNTER — Ambulatory Visit (HOSPITAL_COMMUNITY): Payer: BC Managed Care – PPO | Admitting: *Deleted

## 2019-10-08 ENCOUNTER — Other Ambulatory Visit: Payer: Self-pay

## 2019-10-08 DIAGNOSIS — O99013 Anemia complicating pregnancy, third trimester: Secondary | ICD-10-CM

## 2019-10-08 DIAGNOSIS — O24410 Gestational diabetes mellitus in pregnancy, diet controlled: Secondary | ICD-10-CM

## 2019-10-08 DIAGNOSIS — Z3A29 29 weeks gestation of pregnancy: Secondary | ICD-10-CM

## 2019-10-08 DIAGNOSIS — Z3480 Encounter for supervision of other normal pregnancy, unspecified trimester: Secondary | ICD-10-CM | POA: Insufficient documentation

## 2019-10-08 DIAGNOSIS — Z148 Genetic carrier of other disease: Secondary | ICD-10-CM

## 2019-10-08 DIAGNOSIS — O133 Gestational [pregnancy-induced] hypertension without significant proteinuria, third trimester: Secondary | ICD-10-CM | POA: Diagnosis not present

## 2019-10-08 DIAGNOSIS — O99213 Obesity complicating pregnancy, third trimester: Secondary | ICD-10-CM | POA: Diagnosis not present

## 2019-10-09 ENCOUNTER — Other Ambulatory Visit (HOSPITAL_COMMUNITY): Payer: Self-pay | Admitting: *Deleted

## 2019-10-09 DIAGNOSIS — O24410 Gestational diabetes mellitus in pregnancy, diet controlled: Secondary | ICD-10-CM

## 2019-10-11 ENCOUNTER — Inpatient Hospital Stay (HOSPITAL_COMMUNITY)
Admission: AD | Admit: 2019-10-11 | Discharge: 2019-10-11 | Disposition: A | Discharge status: Home / Self Care | Payer: BC Managed Care – PPO | Attending: Obstetrics and Gynecology | Admitting: Obstetrics and Gynecology

## 2019-10-11 ENCOUNTER — Encounter (HOSPITAL_COMMUNITY): Payer: Self-pay

## 2019-10-11 ENCOUNTER — Other Ambulatory Visit: Payer: Self-pay

## 2019-10-11 ENCOUNTER — Inpatient Hospital Stay (HOSPITAL_BASED_OUTPATIENT_CLINIC_OR_DEPARTMENT_OTHER): Payer: BC Managed Care – PPO

## 2019-10-11 DIAGNOSIS — O368130 Decreased fetal movements, third trimester, not applicable or unspecified: Secondary | ICD-10-CM | POA: Diagnosis present

## 2019-10-11 DIAGNOSIS — O368131 Decreased fetal movements, third trimester, fetus 1: Secondary | ICD-10-CM

## 2019-10-11 DIAGNOSIS — Z87891 Personal history of nicotine dependence: Secondary | ICD-10-CM | POA: Insufficient documentation

## 2019-10-11 DIAGNOSIS — O133 Gestational [pregnancy-induced] hypertension without significant proteinuria, third trimester: Secondary | ICD-10-CM | POA: Insufficient documentation

## 2019-10-11 DIAGNOSIS — O24419 Gestational diabetes mellitus in pregnancy, unspecified control: Secondary | ICD-10-CM | POA: Insufficient documentation

## 2019-10-11 DIAGNOSIS — Z3A29 29 weeks gestation of pregnancy: Secondary | ICD-10-CM | POA: Insufficient documentation

## 2019-10-11 LAB — URINALYSIS, ROUTINE W REFLEX MICROSCOPIC
Bilirubin Urine: NEGATIVE
Glucose, UA: NEGATIVE mg/dL
Hgb urine dipstick: NEGATIVE
Ketones, ur: NEGATIVE mg/dL
Nitrite: NEGATIVE
Protein, ur: NEGATIVE mg/dL
Specific Gravity, Urine: 1.012 (ref 1.005–1.030)
pH: 7 (ref 5.0–8.0)

## 2019-10-11 LAB — COMPREHENSIVE METABOLIC PANEL
ALT: 20 U/L (ref 0–44)
AST: 32 U/L (ref 15–41)
Albumin: 2.7 g/dL — ABNORMAL LOW (ref 3.5–5.0)
Alkaline Phosphatase: 78 U/L (ref 38–126)
Anion gap: 9 (ref 5–15)
BUN: 6 mg/dL (ref 6–20)
CO2: 19 mmol/L — ABNORMAL LOW (ref 22–32)
Calcium: 8.8 mg/dL — ABNORMAL LOW (ref 8.9–10.3)
Chloride: 107 mmol/L (ref 98–111)
Creatinine, Ser: 0.64 mg/dL (ref 0.44–1.00)
GFR calc Af Amer: >60 mL/min (ref >60)
GFR calc non Af Amer: >60 mL/min (ref >60)
Glucose, Bld: 100 mg/dL — ABNORMAL HIGH (ref 70–99)
Potassium: 3.9 mmol/L (ref 3.5–5.1)
Sodium: 135 mmol/L (ref 135–145)
Total Bilirubin: 0.4 mg/dL (ref 0.3–1.2)
Total Protein: 6.1 g/dL — ABNORMAL LOW (ref 6.5–8.1)

## 2019-10-11 LAB — CBC
HCT: 32.9 % — ABNORMAL LOW (ref 36.0–46.0)
Hemoglobin: 11 g/dL — ABNORMAL LOW (ref 12.0–15.0)
MCH: 28.8 pg (ref 26.0–34.0)
MCHC: 33.4 g/dL (ref 30.0–36.0)
MCV: 86.1 fL (ref 80.0–100.0)
Platelets: 226 10*3/uL (ref 150–400)
RBC: 3.82 MIL/uL — ABNORMAL LOW (ref 3.87–5.11)
RDW: 12.9 % (ref 11.5–15.5)
WBC: 9.9 10*3/uL (ref 4.0–10.5)
nRBC: 0 % (ref 0.0–0.2)

## 2019-10-11 LAB — PROTEIN / CREATININE RATIO, URINE
Creatinine, Urine: 107.88 mg/dL
Protein Creatinine Ratio: 0.12 mg/mg{Cre} (ref 0.00–0.15)
Total Protein, Urine: 13 mg/dL

## 2019-10-11 NOTE — MAU Provider Note (Addendum)
Chief Complaint:  Decreased Fetal Movement   First Provider Initiated Contact with Patient 10/11/19 2031      HPI: Jessica Vasquez is a 28 y.o. [id number] at 4w5dby early ultrasound who presents to maternity admissions reporting decreased fetal movement today.  There are no other symptoms. She was recently diagnosed with GHTN and GDM, which she has not had in her previous pregnancies. She reports some visual changes, seeing spots last week but none today and h/a during the pregnancy but none in the last few days and none currently.  She has not tried any treatments. She is feeling fetal movement in MAU.    Past Medical History: Past Medical History:  Diagnosis Date  . Anemia   . Asthma   . Cholelithiasis   . Headache   . Herpes    last outbreak two weeks ago ( mid march 2015)  . Hydradenitis   . Sleep apnea   . Trichimoniasis 01/14/2019    Past obstetric history: OB History  Gravida Para Term Preterm AB Living  4 2 2  0 1 2  SAB TAB Ectopic Multiple Live Births  1 0 0 0 2    # Outcome Date GA Lbr Len/2nd Weight Sex Delivery Anes PTL Lv  4 Current           3 Term 04/27/14 337w6d0:10 / 00:05 3544 g M Vag-Spont EPI  LIV  2 Term 04/09/12 4113w1d:50 / 00:58 4020 g F Vag-Spont EPI  LIV  1 SAB             Past Surgical History: Past Surgical History:  Procedure Laterality Date  . CHOLECYSTECTOMY N/A 10/15/2015   Procedure: LAPAROSCOPIC CHOLECYSTECTOMY WITH INTRAOPERATIVE CHOLANGIOGRAM;  Surgeon: MatDonnie MesaD;  [facility] [place];  Laterality: N/A;  . ERCP N/A 10/16/2015   Procedure: ENDOSCOPIC RETROGRADE CHOLANGIOPANCREATOGRAPHY (ERCP);  Surgeon: CarGatha MayerD;  [facility];  Service: Endoscopy;  Laterality: N/A;  . LAPAROSCOPIC CHOLECYSTECTOMY W/ CHOLANGIOGRAPHY    . nasal sugery      Family History: Family History  Problem Relation Age of Onset  . Gallstones Mother   . Arthritis Mother   . ADD / ADHD Brother   . Diabetes Maternal  Grandmother   . Heart disease Maternal Grandmother   . Hypertension Maternal Grandmother   . Stroke Maternal Grandmother     Social History: Social History   Tobacco Use  . Smoking status: Former Smoker    Quit date: 05/16/2011    Years since quitting: 8.4  . Smokeless tobacco: Never Used  Substance Use Topics  . Alcohol use: No  . Drug use: No    Allergies: No Known Allergies  Meds:  Medications Prior to Admission  Medication Sig Dispense Refill Last Dose  . ferrous sulfate 325 (65 FE) MG EC tablet Take 1 tablet (325 mg total) by mouth 3 (three) times daily with meals. 90 tablet 3 10/11/2019 at Unknown time  . ondansetron (ZOFRAN ODT) 4 MG disintegrating tablet Take 1 tablet (4 mg total) by mouth every 8 (eight) hours as needed for nausea or vomiting. 20 tablet 5 10/11/2019 at Unknown time  . pantoprazole (PROTONIX) 40 MG tablet Take 1 tablet (40 mg total) by mouth daily. 30 tablet 1 10/10/2019 at Unknown time  . Prenatal Vit-Fe Fumarate-FA (PREPLUS) 27-1 MG TABS Take 1 tablet by mouth daily. 30 tablet 13 10/11/2019 at Unknown time  . Blood Pressure Monitoring KIT 1 kit by Does not apply  route once a week. 1 kit 0   . Butalbital-APAP-Caffeine 50-325-40 MG capsule Take 1-2 capsules by mouth every 6 (six) hours as needed for headache. (Patient not taking: Reported on 10/08/2019) 30 capsule 3   . cetirizine-pseudoephedrine (ZYRTEC-D) 5-120 MG tablet Take 1 tablet by mouth as needed for allergies.     . promethazine (PHENERGAN) 12.5 MG tablet Take 1 tablet (12.5 mg total) by mouth every 6 (six) hours as needed for nausea or vomiting. (Patient not taking: Reported on 10/08/2019) 30 tablet 1   . terconazole (TERAZOL 7) 0.4 % vaginal cream Place 1 applicator vaginally at bedtime. (Patient not taking: Reported on 08/05/2019) 45 g 0     ROS:  Review of Systems  Constitutional: Negative for chills, fatigue and fever.  Eyes: Negative for visual disturbance.  Respiratory: Negative for  shortness of breath.   Cardiovascular: Negative for chest pain.  Gastrointestinal: Negative for abdominal pain, nausea and vomiting.  Genitourinary: Negative for difficulty urinating, dysuria, flank pain, pelvic pain, vaginal bleeding, vaginal discharge and vaginal pain.  Neurological: Negative for dizziness and headaches.  Psychiatric/Behavioral: Negative.      I have reviewed patient's Past Medical Hx, Surgical Hx, Family Hx, Social Hx, medications and allergies.   Physical Exam   Patient Vitals for the past 24 hrs:  BP Temp Temp src Pulse Resp Height Weight  10/11/19 2031 (!) 145/95 - - 92 - - -  10/11/19 2001 (!) 155/94 - - 81 - - -  10/11/19 1953 (!) 153/105 - - 89 - - -  10/11/19 1925 (!) 150/98 98.2 F (36.8 C) Oral (!) 101 18 5' 9"  (1.753 m) 120.7 kg   Constitutional: Well-developed, well-nourished female in no acute distress.  HEART: normal rate, heart sounds, regular rhythm RESP: normal effort, lung sounds clear and equal bilaterally GI: Abd soft, non-tender, gravid appropriate for gestational age.  MS: Extremities nontender, no edema, normal ROM Neurologic: Alert and oriented x 4.  GU: Neg CVAT.  PELVIC EXAM: Deferred    FHT:  Baseline 145 , moderate variability, one 10 x 10 acceleration, no decelerations Contractions: None on toco or to palpation   Labs: No results found for this or any previous visit (from the past 24 hour(s)). AB/Positive/-- (07/08 1333)  Imaging:    MAU Course/MDM: Orders Placed This Encounter  Procedures  . [place] MFM Fetal BPP Wo Non Stress  . CBC  . Comprehensive metabolic panel  . Protein / creatinine ratio, urine  . Urinalysis, Routine w reflex microscopic    No orders of the defined types were placed in this encounter.    NST reviewed.  One accel in initial 20 minutes. Although pt is feeling some movement now in MAU, will send for BPP in MAU No s/sx of PEC today, BP elevated with known GHTN PEC labs pending Report to Dr  Darene Lamer with BPP pending   Fatima Blank Certified Nurse-Midwife 10/11/2019 9:49 PM   Care assumed from Fatima Blank at 9:49 PM. BPP 6/8, normal fluid, points off for breathing movements Patient has had something to eat and drink Feeling baby move much more NST reactive with >2 10x10 accels since eating.  Plan: Discharge to home Return precautions given She will follow up in the office as scheduled this week  Brandey Vandalen, Dan Europe, DO OB Fellow, [facility] 10/11/2019 10:48 PM

## 2019-10-11 NOTE — Discharge Instructions (Signed)

## 2019-10-11 NOTE — MAU Note (Signed)
Patient presents to MAU c/o DFM.  Patent reports baby has been moving less today. Last movement felt around 1900. Patient denies LOF or vaginal bleeding.

## 2019-10-13 ENCOUNTER — Encounter: Payer: BC Managed Care – PPO | Attending: Obstetrics & Gynecology | Admitting: *Deleted

## 2019-10-13 ENCOUNTER — Other Ambulatory Visit: Payer: Self-pay

## 2019-10-13 ENCOUNTER — Ambulatory Visit: Payer: BC Managed Care – PPO | Admitting: *Deleted

## 2019-10-13 ENCOUNTER — Other Ambulatory Visit: Payer: Self-pay | Admitting: Lactation Services

## 2019-10-13 DIAGNOSIS — R7309 Other abnormal glucose: Secondary | ICD-10-CM | POA: Diagnosis not present

## 2019-10-13 MED ORDER — ACCU-CHEK GUIDE W/DEVICE KIT: 1.0000 | PACK | Freq: Once | 0 refills | Status: AC

## 2019-10-13 MED ORDER — ACCU-CHEK FASTCLIX LANCETS MISC: 1.0000 | Freq: Four times a day (QID) | 12 refills | Status: DC

## 2019-10-13 MED ORDER — ACCU-CHEK GUIDE VI STRP: ORAL_STRIP | 12 refills | Status: DC

## 2019-10-13 NOTE — Progress Notes (Signed)
Ordered Glucometer and supplies per [employer], Diabetes Educator.

## 2019-10-13 NOTE — Progress Notes (Signed)
  Patient was seen on 10/13/2019 for Gestational Diabetes self-management. EDD 12/01/2019. Patient states no history of GDM but her grandmother has diabetes. 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 Diet history obtained. Patient eats good variety of all food groups. Beverages include water and occasionally pink lemonade. Patient states she is 5'10" so she will need more food than more average height woman  The following learning objectives were met by the patient :   States the definition of Gestational Diabetes  States why dietary management is important in controlling blood glucose  Describes the effects of carbohydrates on blood glucose levels  Demonstrates ability to create a balanced meal plan  Demonstrates carbohydrate counting   States when to check blood glucose levels  Demonstrates proper blood glucose monitoring techniques  States the effect of stress and exercise on blood glucose levels  States the importance of limiting caffeine and abstaining from alcohol and smoking  Plan:  Aim for 3-4 Carb Choices per meal (45-60 grams)   Aim for 1-2 Carbs per snack Begin reading food labels for Total Carbohydrate of foods If OK with your MD, consider  increasing your activity level by walking, Arm Chair Exercises or other activity daily as tolerated Begin checking BG before breakfast and 2 hours after first bite of breakfast, lunch and dinner as directed by MD  Bring Log Book/Sheet and meter to every medical appointment OR use Baby Scripts (see below) Baby Scripts:  Patient was introduced to [employer] and plans to use as record of BG electronically  Take medication if directed by MD  Blood glucose monitor Rx called into pharmacy: Accu Check Guide with Fast Clix drums. I provided patient with Accu Chek flyer with BIN# in case she is told the meter is not covered by Medicaid Patient instructed to test pre breakfast and 2 hours each meal as directed by  MD  Patient instructed to monitor glucose levels: FBS: 60 - 95 mg/dl 2 hour: <120 mg/dl  Patient received the following handouts:  Nutrition Diabetes and Pregnancy  Carbohydrate Counting List  Patient will be seen for follow-up as needed.

## 2019-10-14 ENCOUNTER — Inpatient Hospital Stay (HOSPITAL_COMMUNITY)
Admission: AD | Admit: 2019-10-14 | Discharge: 2019-10-14 | Disposition: A | Discharge status: Home / Self Care | Payer: BC Managed Care – PPO | Attending: Family Medicine | Admitting: Family Medicine

## 2019-10-14 ENCOUNTER — Encounter (HOSPITAL_COMMUNITY): Payer: Self-pay

## 2019-10-14 ENCOUNTER — Telehealth (INDEPENDENT_AMBULATORY_CARE_PROVIDER_SITE_OTHER): Payer: BC Managed Care – PPO | Admitting: Obstetrics & Gynecology

## 2019-10-14 VITALS — BP 128/91 | HR 100

## 2019-10-14 DIAGNOSIS — O1490 Unspecified pre-eclampsia, unspecified trimester: Secondary | ICD-10-CM | POA: Diagnosis present

## 2019-10-14 DIAGNOSIS — Z3A30 30 weeks gestation of pregnancy: Secondary | ICD-10-CM | POA: Insufficient documentation

## 2019-10-14 DIAGNOSIS — Z3480 Encounter for supervision of other normal pregnancy, unspecified trimester: Secondary | ICD-10-CM

## 2019-10-14 DIAGNOSIS — O1493 Unspecified pre-eclampsia, third trimester: Secondary | ICD-10-CM

## 2019-10-14 DIAGNOSIS — R519 Headache, unspecified: Secondary | ICD-10-CM | POA: Diagnosis present

## 2019-10-14 DIAGNOSIS — O1403 Mild to moderate pre-eclampsia, third trimester: Secondary | ICD-10-CM | POA: Diagnosis not present

## 2019-10-14 DIAGNOSIS — O24410 Gestational diabetes mellitus in pregnancy, diet controlled: Secondary | ICD-10-CM | POA: Diagnosis not present

## 2019-10-14 DIAGNOSIS — O24419 Gestational diabetes mellitus in pregnancy, unspecified control: Secondary | ICD-10-CM

## 2019-10-14 HISTORY — DX: Unspecified pre-eclampsia, unspecified trimester: O14.90

## 2019-10-14 LAB — COMPREHENSIVE METABOLIC PANEL
ALT: 20 U/L (ref 0–44)
AST: 32 U/L (ref 15–41)
Albumin: 2.5 g/dL — ABNORMAL LOW (ref 3.5–5.0)
Alkaline Phosphatase: 79 U/L (ref 38–126)
Anion gap: 12 (ref 5–15)
BUN: <5 mg/dL — ABNORMAL LOW (ref 6–20)
CO2: 19 mmol/L — ABNORMAL LOW (ref 22–32)
Calcium: 8.6 mg/dL — ABNORMAL LOW (ref 8.9–10.3)
Chloride: 104 mmol/L (ref 98–111)
Creatinine, Ser: 0.72 mg/dL (ref 0.44–1.00)
GFR calc Af Amer: >60 mL/min (ref >60)
GFR calc non Af Amer: >60 mL/min (ref >60)
Glucose, Bld: 94 mg/dL (ref 70–99)
Potassium: 3.7 mmol/L (ref 3.5–5.1)
Sodium: 135 mmol/L (ref 135–145)
Total Bilirubin: 0.5 mg/dL (ref 0.3–1.2)
Total Protein: 5.5 g/dL — ABNORMAL LOW (ref 6.5–8.1)

## 2019-10-14 LAB — URINALYSIS, ROUTINE W REFLEX MICROSCOPIC
Bilirubin Urine: NEGATIVE
Glucose, UA: NEGATIVE mg/dL
Hgb urine dipstick: NEGATIVE
Ketones, ur: NEGATIVE mg/dL
Nitrite: NEGATIVE
Protein, ur: 100 mg/dL — AB
Specific Gravity, Urine: 1.015 (ref 1.005–1.030)
pH: 6 (ref 5.0–8.0)

## 2019-10-14 LAB — CBC
HCT: 30.8 % — ABNORMAL LOW (ref 36.0–46.0)
Hemoglobin: 10.2 g/dL — ABNORMAL LOW (ref 12.0–15.0)
MCH: 28.6 pg (ref 26.0–34.0)
MCHC: 33.1 g/dL (ref 30.0–36.0)
MCV: 86.3 fL (ref 80.0–100.0)
Platelets: 195 10*3/uL (ref 150–400)
RBC: 3.57 MIL/uL — ABNORMAL LOW (ref 3.87–5.11)
RDW: 12.9 % (ref 11.5–15.5)
WBC: 7 10*3/uL (ref 4.0–10.5)
nRBC: 0 % (ref 0.0–0.2)

## 2019-10-14 LAB — PROTEIN / CREATININE RATIO, URINE
Creatinine, Urine: 182.03 mg/dL
Protein Creatinine Ratio: 0.44 mg/mg{Cre} — ABNORMAL HIGH (ref 0.00–0.15)
Total Protein, Urine: 81 mg/dL

## 2019-10-14 LAB — GLUCOSE, CAPILLARY: Glucose-Capillary: 74 mg/dL (ref 70–99)

## 2019-10-14 MED ORDER — VALACYCLOVIR HCL 500 MG PO TABS: 500.0000 mg | ORAL_TABLET | Freq: Two times a day (BID) | ORAL | 1 refills | Status: DC

## 2019-10-14 MED ORDER — DEXAMETHASONE SODIUM PHOSPHATE 10 MG/ML IJ SOLN
10.0000 mg | Freq: Once | INTRAMUSCULAR | Status: AC
  Administered 2019-10-14: 10 mg via INTRAVENOUS
  Filled 2019-10-14: qty 1

## 2019-10-14 MED ORDER — ONDANSETRON 4 MG PO TBDP
8.0000 mg | ORAL_TABLET | Freq: Once | ORAL | Status: AC
  Administered 2019-10-14: 8 mg via ORAL
  Filled 2019-10-14: qty 2

## 2019-10-14 MED ORDER — LACTATED RINGERS IV BOLUS
1000.0000 mL | Freq: Once | INTRAVENOUS | Status: AC
  Administered 2019-10-14: 1000 mL via INTRAVENOUS

## 2019-10-14 MED ORDER — METOCLOPRAMIDE HCL 5 MG/ML IJ SOLN
10.0000 mg | Freq: Once | INTRAMUSCULAR | Status: AC
  Administered 2019-10-14: 10 mg via INTRAVENOUS
  Filled 2019-10-14: qty 2

## 2019-10-14 MED ORDER — DIPHENHYDRAMINE HCL 50 MG/ML IJ SOLN
25.0000 mg | Freq: Once | INTRAMUSCULAR | Status: AC
  Administered 2019-10-14: 25 mg via INTRAVENOUS
  Filled 2019-10-14: qty 1

## 2019-10-14 MED ORDER — ACETAMINOPHEN 325 MG PO TABS
650.0000 mg | ORAL_TABLET | Freq: Once | ORAL | Status: AC
  Administered 2019-10-14: 650 mg via ORAL
  Filled 2019-10-14: qty 2

## 2019-10-14 MED ORDER — BETAMETHASONE SOD PHOS & ACET 6 (3-3) MG/ML IJ SUSP
12.0000 mg | Freq: Once | INTRAMUSCULAR | Status: AC
  Administered 2019-10-14: 12 mg via INTRAMUSCULAR
  Filled 2019-10-14: qty 5

## 2019-10-14 NOTE — Patient Instructions (Signed)
Preeclampsia and Eclampsia °Preeclampsia is a serious condition that may develop during pregnancy. This condition causes high blood pressure and increased protein in your urine along with other symptoms, such as headaches and vision changes. These symptoms may develop as the condition gets worse. Preeclampsia may occur at 20 weeks of pregnancy or later. °Diagnosing and treating preeclampsia early is very important. If not treated early, it can cause serious problems for you and your baby. One problem it can lead to is eclampsia. Eclampsia is a condition that causes muscle jerking or shaking (convulsions or seizures) and other serious problems for the mother. During pregnancy, delivering your baby may be the best treatment for preeclampsia or eclampsia. For most women, preeclampsia and eclampsia symptoms go away after giving birth. °In rare cases, a woman may develop preeclampsia after giving birth (postpartum preeclampsia). This usually occurs within 48 hours after childbirth but may occur up to 6 weeks after giving birth. °What are the causes? °The cause of preeclampsia is not known. °What increases the risk? °The following risk factors make you more likely to develop preeclampsia: °· Being pregnant for the first time. °· Having had preeclampsia during a past pregnancy. °· Having a family history of preeclampsia. °· Having high blood pressure. °· Being pregnant with more than one baby. °· Being 35 or older. °· Being African-American. °· Having kidney disease or diabetes. °· Having medical conditions such as lupus or blood diseases. °· Being very overweight (obese). °What are the signs or symptoms? °The most common symptoms are: °· Severe headaches. °· Vision problems, such as blurred or double vision. °· Abdominal pain, especially upper abdominal pain. °Other symptoms that may develop as the condition gets worse include: °· Sudden weight gain. °· Sudden swelling of the hands, face, legs, and feet. °· Severe nausea  and vomiting. °· Numbness in the face, arms, legs, and feet. °· Dizziness. °· Urinating less than usual. °· Slurred speech. °· Convulsions or seizures. °How is this diagnosed? °There are no screening tests for preeclampsia. Your health care provider will ask you about symptoms and check for signs of preeclampsia during your prenatal visits. You may also have tests that include: °· Checking your blood pressure. °· Urine tests to check for protein. Your health care provider will check for this at every prenatal visit. °· Blood tests. °· Monitoring your baby's heart rate. °· Ultrasound. °How is this treated? °You and your health care provider will determine the treatment approach that is best for you. Treatment may include: °· Having more frequent prenatal exams to check for signs of preeclampsia, if you have an increased risk for preeclampsia. °· Medicine to lower your blood pressure. °· Staying in the hospital, if your condition is severe. There, treatment will focus on controlling your blood pressure and the amount of fluids in your body (fluid retention). °· Taking medicine (magnesium sulfate) to prevent seizures. This may be given as an injection or through an IV. °· Taking a low-dose aspirin during your pregnancy. °· Delivering your baby early. You may have your labor started with medicine (induced), or you may have a cesarean delivery. °Follow these instructions at home: °Eating and drinking ° °· Drink enough fluid to keep your urine pale yellow. °· Avoid caffeine. °Lifestyle °· Do not use any products that contain nicotine or tobacco, such as cigarettes and e-cigarettes. If you need help quitting, ask your health care provider. °· Do not use alcohol or drugs. °· Avoid stress as much as possible. Rest and get   plenty of sleep. °General instructions °· Take over-the-counter and prescription medicines only as told by your health care provider. °· When lying down, lie on your left side. This keeps pressure off your  major blood vessels. °· When sitting or lying down, raise (elevate) your feet. Try putting some pillows underneath your lower legs. °· Exercise regularly. Ask your health care provider what kinds of exercise are best for you. °· Keep all follow-up and prenatal visits as told by your health care provider. This is important. °How is this prevented? °There is no known way of preventing preeclampsia or eclampsia from developing. However, to lower your risk of complications and detect problems early: °· Get regular prenatal care. Your health care provider may be able to diagnose and treat the condition early. °· Maintain a healthy weight. Ask your health care provider for help managing weight gain during pregnancy. °· Work with your health care provider to manage any long-term (chronic) health conditions you have, such as diabetes or kidney problems. °· You may have tests of your blood pressure and kidney function after giving birth. °· Your health care provider may have you take low-dose aspirin during your next pregnancy. °Contact a health care provider if: °· You have symptoms that your health care provider told you may require more treatment or monitoring, such as: °? Headaches. °? Nausea or vomiting. °? Abdominal pain. °? Dizziness. °? Light-headedness. °Get help right away if: °· You have severe: °? Abdominal pain. °? Headaches that do not get better. °? Dizziness. °? Vision problems. °? Confusion. °? Nausea or vomiting. °· You have any of the following: °? A seizure. °? Sudden, rapid weight gain. °? Sudden swelling in your hands, ankles, or face. °? Trouble moving any part of your body. °? Numbness in any part of your body. °? Trouble speaking. °? Abnormal bleeding. °· You faint. °Summary °· Preeclampsia is a serious condition that may develop during pregnancy. °· This condition causes high blood pressure and increased protein in your urine along with other symptoms, such as headaches and vision  changes. °· Diagnosing and treating preeclampsia early is very important. If not treated early, it can cause serious problems for you and your baby. °· Get help right away if you have symptoms that your health care provider told you to watch for. °This information is not intended to replace advice given to you by your health care provider. Make sure you discuss any questions you have with your health care provider. °Document Released: 11/30/2000 Document Revised: 08/05/2018 Document Reviewed: 07/09/2016 °Elsevier Patient Education © 2020 [employer] ° °

## 2019-10-14 NOTE — MAU Provider Note (Addendum)
Chief Complaint  Patient presents with  . Headache  . Hypertension     First Provider Initiated Contact with Patient 10/14/19 0721      S: Jessica Vasquez  is a 28 y.o. y.o. year old [id number] female at [redacted]w[redacted]d weeks gestation who presents to MAU with elevated blood pressures.  Hx of gestational hypertension diagnosed at 28 weeks during this pregnancy. Current blood pressure medication: none. Reports BP's at home were 150s/120s and 170s/120s. Pregnancy is also complicated by GDM- diet controlled.   She reports HA started occurring on Sunday 10/25 30 minutes prior to discharge home and was told to take Tylenol 1,000mg  for HA. She reports taking medication since Sunday without relief. Has taken Tylenol throughout the day on Monday, then started taking Fioricet yesterday without relief. Rates pain 10/10. Denies vision changes or RUQ pain.   She denies contractions, vaginal bleeding, discharge or LOF. Reports +FM.  O:  Patient Vitals for the past 24 hrs:  BP Temp Temp src Pulse Resp SpO2 Weight  10/14/19 0932 136/85 - - 86 - - -  10/14/19 0916 (!) 139/94 - - 72 - - -  10/14/19 0901 136/88 - - 74 - - -  10/14/19 0846 (!) 152/96 - - 75 - - -  10/14/19 0831 (!) 152/85 - - 71 - - -  10/14/19 0816 (!) 137/96 - - 75 - 99 % -  10/14/19 0801 (!) 155/106 - - 86 - - -  10/14/19 0730 (!) 139/91 - - 83 - 100 % -  10/14/19 0713 (!) 159/105 - - 90 - - -  10/14/19 0658 (!) 146/106 97.7 F (36.5 C) Oral 94 20 - 121.9 kg   General: NAD Heart: Regular rate Lungs: Normal rate and effort Abd: Soft, NT, Gravid, S=D Extremities: no pedal edema Neuro: 2+ deep tendon reflexes, No clonus Pelvic: deferred    PEC labs pending  Treatments in MAU included headache cocktail: IV LR, benadryl, decadron and reglan IV.   Care taken over by Luna Kitchens CNM @0812  Cherokee Clowers, CNM 10/14/19, 9:59 AM  FHR: 140 bpm, mod var, present acel, neg decel Toco:no contractions  Results for orders placed or  performed during the hospital encounter of 10/14/19 (from the past 24 hour(s))  CBC     Status: Abnormal   Collection Time: 10/14/19  7:17 AM  Result Value Ref Range   WBC 7.0 4.0 - 10.5 K/uL   RBC 3.57 (L) 3.87 - 5.11 MIL/uL   Hemoglobin 10.2 (L) 12.0 - 15.0 g/dL   HCT 10/16/19 (L) 76.7 - 20.9 %   MCV 86.3 80.0 - 100.0 fL   MCH 28.6 26.0 - 34.0 pg   MCHC 33.1 30.0 - 36.0 g/dL   RDW 47.0 96.2 - 83.6 %   Platelets 195 150 - 400 K/uL   nRBC 0.0 0.0 - 0.2 %  Comprehensive metabolic panel     Status: Abnormal   Collection Time: 10/14/19  7:17 AM  Result Value Ref Range   Sodium 135 135 - 145 mmol/L   Potassium 3.7 3.5 - 5.1 mmol/L   Chloride 104 98 - 111 mmol/L   CO2 19 (L) 22 - 32 mmol/L   Glucose, Bld 94 70 - 99 mg/dL   BUN <5 (L) 6 - 20 mg/dL   Creatinine, Ser 10/16/19 0.44 - 1.00 mg/dL   Calcium 8.6 (L) 8.9 - 10.3 mg/dL   Total Protein 5.5 (L) 6.5 - 8.1 g/dL   Albumin 2.5 (L) 3.5 -  5.0 g/dL   AST 32 15 - 41 U/L   ALT 20 0 - 44 U/L   Alkaline Phosphatase 79 38 - 126 U/L   Total Bilirubin 0.5 0.3 - 1.2 mg/dL   GFR calc non Af Amer >60 >60 mL/min   GFR calc Af Amer >60 >60 mL/min   Anion gap 12 5 - 15  Protein / creatinine ratio, urine     Status: Abnormal   Collection Time: 10/14/19  7:17 AM  Result Value Ref Range   Creatinine, Urine 182.03 mg/dL   Total Protein, Urine 81 mg/dL   Protein Creatinine Ratio 0.44 (H) 0.00 - 0.15 mg/mg[Cre]  Urinalysis, Routine w reflex microscopic     Status: Abnormal   Collection Time: 10/14/19  8:03 AM  Result Value Ref Range   Color, Urine YELLOW YELLOW   APPearance HAZY (A) CLEAR   Specific Gravity, Urine 1.015 1.005 - 1.030   pH 6.0 5.0 - 8.0   Glucose, UA NEGATIVE NEGATIVE mg/dL   Hgb urine dipstick NEGATIVE NEGATIVE   Bilirubin Urine NEGATIVE NEGATIVE   Ketones, ur NEGATIVE NEGATIVE mg/dL   Protein, ur 100 (A) NEGATIVE mg/dL   Nitrite NEGATIVE NEGATIVE   Leukocytes,Ua MODERATE (A) NEGATIVE   RBC / HPF 0-5 0 - 5 RBC/hpf   WBC, UA 0-5 0  - 5 WBC/hpf   Bacteria, UA RARE (A) NONE SEEN   Squamous Epithelial / LPF 11-20 0 - 5   Mucus PRESENT   Glucose, capillary     Status: None   Collection Time: 10/14/19  9:30 AM  Result Value Ref Range   Glucose-Capillary 74 70 - 99 mg/dL    A: Patient now with pre-eclampsia without severe features (ruled in by elevated PCR, worsened from two days ago).       P:  -Keep virtual visit this afternoon to make sure provider sees her at [facility] one dose of BMZ this morning; message sent to [facility] to schedule her for nurse visit tomorrow to received second dose.  -Started Valtrex now in case patient needs early delivery -Reviewed warning signs of when to return to MAU ([place], SOB, blurry vision, decreased fetal movements, floating spots, RUQ, sudden swelling).  -Continue to follow blood sugar monitoring -Dr. Elly Modena agrees with plan of care.   1000: patient was about to be discharged and then complained of headache again.; 4/10 and she is asking for pain relief. Will give 650 mg of Tylenol, juice and crackers as patient hasn't eaten today.  -blood sugar is a 74  1100: Patient states that her HA is now a 0/10, only feels it when she "shakes her head back and forth". Denies any other s/s of pre-e. Discharged home with return precautions; patient has follow-up plan in place (appt this afternoon and frequent antenatal testing).

## 2019-10-14 NOTE — Progress Notes (Signed)
Informed CNM of patient's headache. CNM orders tylenol and zofran. Give patient some crackers and something to drink with med administration. Reassess pain at appropriate interval after med admin.

## 2019-10-14 NOTE — Progress Notes (Signed)
I connected with  [name] on 10/14/19 by a video enabled telemedicine application and verified that I am speaking with the correct person using two identifiers.   I discussed the limitations of evaluation and management by telemedicine. The patient expressed understanding and agreed to proceed.   MyChart ROB.

## 2019-10-14 NOTE — Progress Notes (Signed)
   TELEHEALTH VIRTUAL OBSTETRICS VISIT ENCOUNTER NOTE  I connected with [name] on 10/14/19 at  3:30 PM EDT by telephone at home and verified that I am speaking with the correct person using two identifiers.   I discussed the limitations, risks, security and privacy concerns of performing an evaluation and management service by telephone and the availability of in person appointments. I also discussed with the patient that there may be a patient responsible charge related to this service. The patient expressed understanding and agreed to proceed.  Subjective:  Jessica Vasquez is a 28 y.o. [id number] at [redacted]w[redacted]d being followed for ongoing prenatal care.  She is currently monitored for the following issues for this high-risk pregnancy and has Anemia; HSV (herpes simplex virus) infection; Supervision of other normal pregnancy, antepartum; Chronic nasal congestion; Nasal polyp; Alpha thalassemia silent carrier; Snoring; Gestational hypertension; GDM (gestational diabetes mellitus); and Preeclampsia on their problem list.  Patient reports headache. Reports fetal movement. Denies any contractions, bleeding or leaking of fluid.   The following portions of the patient's history were reviewed and updated as appropriate: allergies, current medications, past family history, past medical history, past social history, past surgical history and problem list.   Objective:   General:  Alert, oriented and cooperative.   Mental Status: Normal mood and affect perceived. Normal judgment and thought content.  Rest of physical exam deferred due to type of encounter  Assessment and Plan:  Pregnancy: [id number] at [redacted]w[redacted]d 1. Supervision of other normal pregnancy, antepartum   2. Pre-eclampsia in third trimester Seen in MAU today and is to return tomorrow at [facility] for [place] and check BP  3. Gestational diabetes mellitus (GDM) in third trimester, gestational diabetes method of control unspecified   Preterm labor symptoms  and general obstetric precautions including but not limited to vaginal bleeding, contractions, leaking of fluid and fetal movement were reviewed in detail with the patient.  I discussed the assessment and treatment plan with the patient. The patient was provided an opportunity to ask questions and all were answered. The patient agreed with the plan and demonstrated an understanding of the instructions. The patient was advised to call back or seek an in-person office evaluation/go to [facility]U at [facility] for any urgent or concerning symptoms. Please refer to After Visit Summary for other counseling recommendations.   I provided 10 minutes of non-face-to-face time during this encounter.  Return in 1 day (on 10/15/2019) for needs betamethasone.  Future Appointments  Date Time Provider [place]  10/15/2019  3:15 PM [place] None  10/16/2019  3:00 PM [place] NURSE [place] MFC-US  10/16/2019  3:00 PM [place] 3 WH-MFCUS MFC-US  10/22/2019  2:40 PM [place] NURSE [place] MFC-US  10/22/2019  2:45 PM [place] 2 WH-MFCUS MFC-US  11/02/2019  3:30 PM [place] NURSE [place] MFC-US  11/02/2019  3:30 PM [place]    [name], [place] for [employer], [place]

## 2019-10-14 NOTE — MAU Note (Addendum)
Pt reports to MAU c/o HA and having high BP's pt states she has had a HA since she was discharged last time and medications do not work. Pt denies visual changes or RUQ pain. Pt states at home her BP was 150's/120's and 170's/120's. Pt has GHTN and GDM. No bleeding or LOF. +FM.

## 2019-10-14 NOTE — Discharge Instructions (Signed)
Preeclampsia and Eclampsia °Preeclampsia is a serious condition that may develop during pregnancy. This condition causes high blood pressure and increased protein in your urine along with other symptoms, such as headaches and vision changes. These symptoms may develop as the condition gets worse. Preeclampsia may occur at 20 weeks of pregnancy or later. °Diagnosing and treating preeclampsia early is very important. If not treated early, it can cause serious problems for you and your baby. One problem it can lead to is eclampsia. Eclampsia is a condition that causes muscle jerking or shaking (convulsions or seizures) and other serious problems for the mother. During pregnancy, delivering your baby may be the best treatment for preeclampsia or eclampsia. For most women, preeclampsia and eclampsia symptoms go away after giving birth. °In rare cases, a woman may develop preeclampsia after giving birth (postpartum preeclampsia). This usually occurs within 48 hours after childbirth but may occur up to 6 weeks after giving birth. °What are the causes? °The cause of preeclampsia is not known. °What increases the risk? °The following risk factors make you more likely to develop preeclampsia: °· Being pregnant for the first time. °· Having had preeclampsia during a past pregnancy. °· Having a family history of preeclampsia. °· Having high blood pressure. °· Being pregnant with more than one baby. °· Being 35 or older. °· Being African-American. °· Having kidney disease or diabetes. °· Having medical conditions such as lupus or blood diseases. °· Being very overweight (obese). °What are the signs or symptoms? °The most common symptoms are: °· Severe headaches. °· Vision problems, such as blurred or double vision. °· Abdominal pain, especially upper abdominal pain. °Other symptoms that may develop as the condition gets worse include: °· Sudden weight gain. °· Sudden swelling of the hands, face, legs, and feet. °· Severe nausea  and vomiting. °· Numbness in the face, arms, legs, and feet. °· Dizziness. °· Urinating less than usual. °· Slurred speech. °· Convulsions or seizures. °How is this diagnosed? °There are no screening tests for preeclampsia. Your health care provider will ask you about symptoms and check for signs of preeclampsia during your prenatal visits. You may also have tests that include: °· Checking your blood pressure. °· Urine tests to check for protein. Your health care provider will check for this at every prenatal visit. °· Blood tests. °· Monitoring your baby's heart rate. °· Ultrasound. °How is this treated? °You and your health care provider will determine the treatment approach that is best for you. Treatment may include: °· Having more frequent prenatal exams to check for signs of preeclampsia, if you have an increased risk for preeclampsia. °· Medicine to lower your blood pressure. °· Staying in the hospital, if your condition is severe. There, treatment will focus on controlling your blood pressure and the amount of fluids in your body (fluid retention). °· Taking medicine (magnesium sulfate) to prevent seizures. This may be given as an injection or through an IV. °· Taking a low-dose aspirin during your pregnancy. °· Delivering your baby early. You may have your labor started with medicine (induced), or you may have a cesarean delivery. °Follow these instructions at home: °Eating and drinking ° °· Drink enough fluid to keep your urine pale yellow. °· Avoid caffeine. °Lifestyle °· Do not use any products that contain nicotine or tobacco, such as cigarettes and e-cigarettes. If you need help quitting, ask your health care provider. °· Do not use alcohol or drugs. °· Avoid stress as much as possible. Rest and get   plenty of sleep. °General instructions °· Take over-the-counter and prescription medicines only as told by your health care provider. °· When lying down, lie on your left side. This keeps pressure off your  major blood vessels. °· When sitting or lying down, raise (elevate) your feet. Try putting some pillows underneath your lower legs. °· Exercise regularly. Ask your health care provider what kinds of exercise are best for you. °· Keep all follow-up and prenatal visits as told by your health care provider. This is important. °How is this prevented? °There is no known way of preventing preeclampsia or eclampsia from developing. However, to lower your risk of complications and detect problems early: °· Get regular prenatal care. Your health care provider may be able to diagnose and treat the condition early. °· Maintain a healthy weight. Ask your health care provider for help managing weight gain during pregnancy. °· Work with your health care provider to manage any long-term (chronic) health conditions you have, such as diabetes or kidney problems. °· You may have tests of your blood pressure and kidney function after giving birth. °· Your health care provider may have you take low-dose aspirin during your next pregnancy. °Contact a health care provider if: °· You have symptoms that your health care provider told you may require more treatment or monitoring, such as: °? Headaches. °? Nausea or vomiting. °? Abdominal pain. °? Dizziness. °? Light-headedness. °Get help right away if: °· You have severe: °? Abdominal pain. °? Headaches that do not get better. °? Dizziness. °? Vision problems. °? Confusion. °? Nausea or vomiting. °· You have any of the following: °? A seizure. °? Sudden, rapid weight gain. °? Sudden swelling in your hands, ankles, or face. °? Trouble moving any part of your body. °? Numbness in any part of your body. °? Trouble speaking. °? Abnormal bleeding. °· You faint. °Summary °· Preeclampsia is a serious condition that may develop during pregnancy. °· This condition causes high blood pressure and increased protein in your urine along with other symptoms, such as headaches and vision  changes. °· Diagnosing and treating preeclampsia early is very important. If not treated early, it can cause serious problems for you and your baby. °· Get help right away if you have symptoms that your health care provider told you to watch for. °This information is not intended to replace advice given to you by your health care provider. Make sure you discuss any questions you have with your health care provider. °Document Released: 11/30/2000 Document Revised: 08/05/2018 Document Reviewed: 07/09/2016 °Elsevier Patient Education © 2020 Elsevier Inc. ° °

## 2019-10-15 ENCOUNTER — Other Ambulatory Visit: Payer: Self-pay

## 2019-10-15 ENCOUNTER — Ambulatory Visit (INDEPENDENT_AMBULATORY_CARE_PROVIDER_SITE_OTHER): Payer: BC Managed Care – PPO

## 2019-10-15 DIAGNOSIS — O1493 Unspecified pre-eclampsia, third trimester: Secondary | ICD-10-CM | POA: Diagnosis not present

## 2019-10-15 MED ORDER — BETAMETHASONE SOD PHOS & ACET 6 (3-3) MG/ML IJ SUSP
12.0000 mg | Freq: Once | INTRAMUSCULAR | Status: AC
  Administered 2019-10-15: 12 mg via INTRAMUSCULAR

## 2019-10-15 NOTE — Progress Notes (Signed)
Pt is here for a second betamethasone injection.  Pt tolerated injection well in the LUOQ without any complications.  Pt was advised to make a 2 week ROB virtual appointment. Pt verbalized understanding. -EH/RMA

## 2019-10-16 ENCOUNTER — Ambulatory Visit (HOSPITAL_COMMUNITY): Payer: BC Managed Care – PPO

## 2019-10-16 ENCOUNTER — Inpatient Hospital Stay (HOSPITAL_BASED_OUTPATIENT_CLINIC_OR_DEPARTMENT_OTHER): Payer: BC Managed Care – PPO

## 2019-10-16 ENCOUNTER — Inpatient Hospital Stay (HOSPITAL_COMMUNITY)
Admission: AD | Admit: 2019-10-16 | Discharge: 2019-10-16 | Disposition: A | Discharge status: Home / Self Care | Payer: BC Managed Care – PPO | Attending: Obstetrics and Gynecology | Admitting: Obstetrics and Gynecology

## 2019-10-16 ENCOUNTER — Ambulatory Visit (HOSPITAL_COMMUNITY)
Admission: RE | Admit: 2019-10-16 | Discharge: 2019-10-16 | Disposition: A | Discharge status: Home / Self Care | Payer: BC Managed Care – PPO | Source: Ambulatory Visit | Attending: Obstetrics and Gynecology | Admitting: Obstetrics and Gynecology

## 2019-10-16 ENCOUNTER — Telehealth: Payer: Self-pay

## 2019-10-16 ENCOUNTER — Encounter (HOSPITAL_COMMUNITY): Payer: Self-pay

## 2019-10-16 DIAGNOSIS — J45909 Unspecified asthma, uncomplicated: Secondary | ICD-10-CM | POA: Diagnosis not present

## 2019-10-16 DIAGNOSIS — O99353 Diseases of the nervous system complicating pregnancy, third trimester: Secondary | ICD-10-CM | POA: Diagnosis not present

## 2019-10-16 DIAGNOSIS — G44209 Tension-type headache, unspecified, not intractable: Secondary | ICD-10-CM

## 2019-10-16 DIAGNOSIS — O368130 Decreased fetal movements, third trimester, not applicable or unspecified: Secondary | ICD-10-CM

## 2019-10-16 DIAGNOSIS — Z87891 Personal history of nicotine dependence: Secondary | ICD-10-CM | POA: Diagnosis not present

## 2019-10-16 DIAGNOSIS — O1493 Unspecified pre-eclampsia, third trimester: Secondary | ICD-10-CM

## 2019-10-16 DIAGNOSIS — Z3689 Encounter for other specified antenatal screening: Secondary | ICD-10-CM

## 2019-10-16 DIAGNOSIS — O24410 Gestational diabetes mellitus in pregnancy, diet controlled: Secondary | ICD-10-CM | POA: Diagnosis not present

## 2019-10-16 DIAGNOSIS — Z79899 Other long term (current) drug therapy: Secondary | ICD-10-CM | POA: Insufficient documentation

## 2019-10-16 DIAGNOSIS — O133 Gestational [pregnancy-induced] hypertension without significant proteinuria, third trimester: Secondary | ICD-10-CM

## 2019-10-16 DIAGNOSIS — O26893 Other specified pregnancy related conditions, third trimester: Secondary | ICD-10-CM | POA: Diagnosis not present

## 2019-10-16 DIAGNOSIS — O99513 Diseases of the respiratory system complicating pregnancy, third trimester: Secondary | ICD-10-CM | POA: Diagnosis not present

## 2019-10-16 DIAGNOSIS — Z3A30 30 weeks gestation of pregnancy: Secondary | ICD-10-CM

## 2019-10-16 DIAGNOSIS — O163 Unspecified maternal hypertension, third trimester: Secondary | ICD-10-CM | POA: Diagnosis not present

## 2019-10-16 DIAGNOSIS — N63 Unspecified lump in unspecified breast: Secondary | ICD-10-CM | POA: Diagnosis not present

## 2019-10-16 DIAGNOSIS — Z3480 Encounter for supervision of other normal pregnancy, unspecified trimester: Secondary | ICD-10-CM

## 2019-10-16 DIAGNOSIS — O99013 Anemia complicating pregnancy, third trimester: Secondary | ICD-10-CM

## 2019-10-16 DIAGNOSIS — O368190 Decreased fetal movements, unspecified trimester, not applicable or unspecified: Secondary | ICD-10-CM

## 2019-10-16 DIAGNOSIS — O99213 Obesity complicating pregnancy, third trimester: Secondary | ICD-10-CM

## 2019-10-16 LAB — CBC
HCT: 28 % — ABNORMAL LOW (ref 36.0–46.0)
Hemoglobin: 9.3 g/dL — ABNORMAL LOW (ref 12.0–15.0)
MCH: 28.6 pg (ref 26.0–34.0)
MCHC: 33.2 g/dL (ref 30.0–36.0)
MCV: 86.2 fL (ref 80.0–100.0)
Platelets: 208 10*3/uL (ref 150–400)
RBC: 3.25 MIL/uL — ABNORMAL LOW (ref 3.87–5.11)
RDW: 13.1 % (ref 11.5–15.5)
WBC: 12 10*3/uL — ABNORMAL HIGH (ref 4.0–10.5)
nRBC: 0.3 % — ABNORMAL HIGH (ref 0.0–0.2)

## 2019-10-16 LAB — COMPREHENSIVE METABOLIC PANEL
ALT: 24 U/L (ref 0–44)
AST: 33 U/L (ref 15–41)
Albumin: 2.7 g/dL — ABNORMAL LOW (ref 3.5–5.0)
Alkaline Phosphatase: 73 U/L (ref 38–126)
Anion gap: 11 (ref 5–15)
BUN: 6 mg/dL (ref 6–20)
CO2: 20 mmol/L — ABNORMAL LOW (ref 22–32)
Calcium: 8.7 mg/dL — ABNORMAL LOW (ref 8.9–10.3)
Chloride: 104 mmol/L (ref 98–111)
Creatinine, Ser: 0.7 mg/dL (ref 0.44–1.00)
GFR calc Af Amer: >60 mL/min (ref >60)
GFR calc non Af Amer: >60 mL/min (ref >60)
Glucose, Bld: 86 mg/dL (ref 70–99)
Potassium: 3.5 mmol/L (ref 3.5–5.1)
Sodium: 135 mmol/L (ref 135–145)
Total Bilirubin: 0.4 mg/dL (ref 0.3–1.2)
Total Protein: 5.8 g/dL — ABNORMAL LOW (ref 6.5–8.1)

## 2019-10-16 LAB — URINALYSIS, ROUTINE W REFLEX MICROSCOPIC
Bacteria, UA: NONE SEEN
Bilirubin Urine: NEGATIVE
Glucose, UA: NEGATIVE mg/dL
Hgb urine dipstick: NEGATIVE
Ketones, ur: NEGATIVE mg/dL
Leukocytes,Ua: NEGATIVE
Nitrite: NEGATIVE
Protein, ur: 100 mg/dL — AB
Specific Gravity, Urine: 1.018 (ref 1.005–1.030)
pH: 6 (ref 5.0–8.0)

## 2019-10-16 MED ORDER — BUTALBITAL-APAP-CAFFEINE 50-325-40 MG PO TABS
2.0000 | ORAL_TABLET | Freq: Once | ORAL | Status: AC
  Administered 2019-10-16: 2 via ORAL
  Filled 2019-10-16: qty 2

## 2019-10-16 NOTE — Discharge Instructions (Signed)
Hypertension During Pregnancy °Hypertension is also called high blood pressure. High blood pressure means that the force of your blood moving in your body is too strong. It can cause problems for you and your baby. Different types of high blood pressure can happen during pregnancy. The types are: °· High blood pressure before you got pregnant. This is called chronic hypertension.  This can continue during your pregnancy. Your doctor will want to keep checking your blood pressure. You may need medicine to keep your blood pressure under control while you are pregnant. You will need follow-up visits after you have your baby. °· High blood pressure that goes up during pregnancy when it was normal before. This is called gestational hypertension. It will usually get better after you have your baby, but your doctor will need to watch your blood pressure to make sure that it is getting better. °· Very high blood pressure during pregnancy. This is called preeclampsia. Very high blood pressure is an emergency that needs to be checked and treated right away. °· You may develop very high blood pressure after giving birth. This is called postpartum preeclampsia. This usually occurs within 48 hours after childbirth but may occur up to 6 weeks after giving birth. This is rare. °How does this affect me? °If you have high blood pressure during pregnancy, you have a higher chance of developing high blood pressure: °· As you get older. °· If you get pregnant again. °In some cases, high blood pressure during pregnancy can cause: °· Stroke. °· Heart attack. °· Damage to the kidneys, lungs, or liver. °· Preeclampsia. °· Jerky movements you cannot control (convulsions or seizures). °· Problems with the placenta. °How does this affect my baby? °Your baby may: °· Be born early. °· Not weigh as much as he or she should. °· Not handle labor well, leading to a c-section birth. °What are the risks? °· Having high blood pressure during a past  pregnancy. °· Being overweight. °· Being 35 years old or older. °· Being pregnant for the first time. °· Being pregnant with more than one baby. °· Becoming pregnant using fertility methods, such as IVF. °· Having other problems, such as diabetes, or kidney disease. °· Having family members who have high blood pressure. °What can I do to lower my risk? ° °· Keep a healthy weight. °· Eat a healthy diet. °· Follow what your doctor tells you about treating any medical problems that you had before becoming pregnant. °It is very important to go to all of your doctor visits. Your doctor will check your blood pressure and make sure that your pregnancy is progressing as it should. Treatment should start early if a problem is found. °How is this treated? °Treatment for high blood pressure during pregnancy can differ depending on the type of high blood pressure you have and how serious it is. °· You may need to take blood pressure medicine. °· If you have been taking medicine for your blood pressure, you may need to change the medicine during pregnancy if it is not safe for your baby. °· If your doctor thinks that you could get very high blood pressure, he or she may tell you to take a low-dose aspirin during your pregnancy. °· If you have very high blood pressure, you may need to stay in the hospital so you and your baby can be watched closely. You may also need to take medicine to lower your blood pressure. This medicine may be given by mouth   or through an IV tube. °· In some cases, if your condition gets worse, you may need to have your baby early. °Follow these instructions at home: °Eating and drinking ° °· Drink enough fluid to keep your pee (urine) pale yellow. °· Avoid caffeine. °Lifestyle °· Do not use any products that contain nicotine or tobacco, such as cigarettes, e-cigarettes, and chewing tobacco. If you need help quitting, ask your doctor. °· Do not use alcohol or drugs. °· Avoid stress. °· Rest and get plenty  of sleep. °· Regular exercise can help. Ask your doctor what kinds of exercise are best for you. °General instructions °· Take over-the-counter and prescription medicines only as told by your doctor. °· Keep all prenatal and follow-up visits as told by your doctor. This is important. °Contact a doctor if: °· You have symptoms that your doctor told you to watch for, such as: °? Headaches. °? Nausea. °? Vomiting. °? Belly (abdominal) pain. °? Dizziness. °? Light-headedness. °Get help right away if: °· You have: °? Very bad belly pain that does not get better with treatment. °? A very bad headache that does not get better. °? Vomiting that does not get better. °? Sudden, fast weight gain. °? Sudden swelling in your hands, ankles, or face. °? Bleeding from your vagina. °? Blood in your pee. °? Blurry vision. °? Double vision. °? Shortness of breath. °? Chest pain. °? Weakness on one side of your body. °? Trouble talking. °· Your baby is not moving as much as usual. °Summary °· High blood pressure is also called hypertension. °· High blood pressure means that the force of your blood moving in your body is too strong. °· High blood pressure can cause problems for you and your baby. °· Keep all follow-up visits as told by your doctor. This is important. °This information is not intended to replace advice given to you by your health care provider. Make sure you discuss any questions you have with your health care provider. °Document Released: 01/05/2011 Document Revised: 03/26/2019 Document Reviewed: 12/30/2018 °Elsevier Patient Education © 2020 Elsevier Inc. ° °Fetal Movement Counts °Patient Name: ________________________________________________ Patient Due Date: ____________________ °What is a fetal movement count? ° °A fetal movement count is the number of times that you feel your baby move during a certain amount of time. This may also be called a fetal kick count. A fetal movement count is recommended for every  pregnant woman. You may be asked to start counting fetal movements as early as week 28 of your pregnancy. °Pay attention to when your baby is most active. You may notice your baby's sleep and wake cycles. You may also notice things that make your baby move more. You should do a fetal movement count: °· When your baby is normally most active. °· At the same time each day. °A good time to count movements is while you are resting, after having something to eat and drink. °How do I count fetal movements? °1. Find a quiet, comfortable area. Sit, or lie down on your side. °2. Write down the date, the start time and stop time, and the number of movements that you felt between those two times. Take this information with you to your health care visits. °3. For 2 hours, count kicks, flutters, swishes, rolls, and jabs. You should feel at least 10 movements during 2 hours. °4. You may stop counting after you have felt 10 movements. °5. If you do not feel 10 movements in 2 hours, have   something to eat and drink. Then, keep resting and counting for 1 hour. If you feel at least 4 movements during that hour, you may stop counting. °Contact a health care provider if: °· You feel fewer than 4 movements in 2 hours. °· Your baby is not moving like he or she usually does. °Date: ____________ Start time: ____________ Stop time: ____________ Movements: ____________ °Date: ____________ Start time: ____________ Stop time: ____________ Movements: ____________ °Date: ____________ Start time: ____________ Stop time: ____________ Movements: ____________ °Date: ____________ Start time: ____________ Stop time: ____________ Movements: ____________ °Date: ____________ Start time: ____________ Stop time: ____________ Movements: ____________ °Date: ____________ Start time: ____________ Stop time: ____________ Movements: ____________ °Date: ____________ Start time: ____________ Stop time: ____________ Movements: ____________ °Date: ____________ Start  time: ____________ Stop time: ____________ Movements: ____________ °Date: ____________ Start time: ____________ Stop time: ____________ Movements: ____________ °This information is not intended to replace advice given to you by your health care provider. Make sure you discuss any questions you have with your health care provider. °Document Released: 01/02/2007 Document Revised: 12/23/2018 Document Reviewed: 01/12/2016 °Elsevier Patient Education © 2020 [employer] Inc. ° °

## 2019-10-16 NOTE — Telephone Encounter (Signed)
Returned call and pt stated that her BP is 157/115, advised pt to retake BP and reading was 164/116, pt states that she has a headache, advised pt to be evaluated at the hospital, pt agreed.

## 2019-10-16 NOTE — MAU Note (Signed)
.  Jessica Vasquez is a 28 y.o. at [redacted]w[redacted]d here in [facility]U reporting: elevated BP (162/115) at home, HA, nausea, and floaters in vision. Reports she was diagnosed with pre-eclampsia this past week.  Onset of complaint: Reports HA and floaters began this morning at 1000.  Pain score: HA 7/10 FHT:143 BP: 153/87

## 2019-10-16 NOTE — MAU Provider Note (Addendum)
History     CSN: [id number]  Arrival date and time: 10/16/19 1149   First Provider Initiated Contact with Patient 10/16/19 1237      Chief Complaint  Patient presents with  . Hypertension  . Headache  . Nausea   28 y.o. [id number] '@30'$ .3 wks with known PEC presenting with elevated BP and HA. Reports onset of left temporal and peri-orbital HA around 10 am. She checked her BP and was 157/115. She called the office and rechecked her BP and was 164/116. She also reports white floaters, unsure of constant or intermittent. Denies CP, SOB, and RUQ pain. Reports decreased FM x2 days. Denies VB, LOF, or ctx.  OB History    Gravida  4   Para  2   Term  2   Preterm  0   AB  1   Living  2     SAB  1   TAB  0   Ectopic  0   Multiple  0   Live Births  2           Past Medical History:  Diagnosis Date  . Anemia   . Asthma   . Cholelithiasis   . Headache   . Herpes    last outbreak two weeks ago ( mid march 2015)  . Hydradenitis   . Sleep apnea   . Trichimoniasis 01/14/2019    Past Surgical History:  Procedure Laterality Date  . CHOLECYSTECTOMY N/A 10/15/2015   Procedure: LAPAROSCOPIC CHOLECYSTECTOMY WITH INTRAOPERATIVE CHOLANGIOGRAM;  Surgeon: Donnie Mesa, MD;  Location: [place];  Service: General;  Laterality: N/A;  . ERCP N/A 10/16/2015   Procedure: ENDOSCOPIC RETROGRADE CHOLANGIOPANCREATOGRAPHY (ERCP);  Surgeon: Gatha Mayer, MD;  Location: [facility] ENDOSCOPY;  Service: Endoscopy;  Laterality: N/A;  . LAPAROSCOPIC CHOLECYSTECTOMY W/ CHOLANGIOGRAPHY    . nasal sugery      Family History  Problem Relation Age of Onset  . Gallstones Mother   . Arthritis Mother   . ADD / ADHD Brother   . Diabetes Maternal Grandmother   . Heart disease Maternal Grandmother   . Hypertension Maternal Grandmother   . Stroke Maternal Grandmother     Social History   Tobacco Use  . Smoking status: Former Smoker    Quit date: 05/16/2011    Years since quitting: 8.4  . Smokeless  tobacco: Never Used  Substance Use Topics  . Alcohol use: No  . Drug use: No    Allergies: No Known Allergies  No medications prior to admission.    Review of Systems  Eyes: Positive for photophobia and visual disturbance.  Respiratory: Negative for shortness of breath.   Cardiovascular: Negative for chest pain.  Gastrointestinal: Negative for abdominal pain.  Genitourinary: Negative for vaginal bleeding and vaginal discharge.  Neurological: Positive for headaches.   Physical Exam   Blood pressure (!) 149/82, pulse 72, temperature 97.7 F (36.5 C), temperature source Oral, resp. rate 19, height '5\' 9"'$  (1.753 m), weight 123 kg, last menstrual period 03/17/2019, SpO2 100 %, currently breastfeeding. Patient Vitals for the past 24 hrs:  BP Temp Temp src Pulse Resp SpO2 Height Weight  10/16/19 1442 - 97.7 F (36.5 C) Oral - 19 - - -  10/16/19 1441 (!) 149/82 - - 72 - - - -  10/16/19 1431 (!) 155/92 - - 71 - - - -  10/16/19 1416 (!) 150/88 - - 70 - - - -  10/16/19 1408 (!) 147/91 - - 74 - - - -  10/16/19 1345 138/75 - - 78 - - - -  10/16/19 1330 (!) 142/76 - - 77 - - - -  10/16/19 1316 131/76 - - 77 - - - -  10/16/19 1301 128/68 - - 68 - - - -  10/16/19 1246 (!) 141/86 - - 75 - - - -  10/16/19 1231 (!) 151/99 - - 75 - - - -  10/16/19 1224 (!) 153/87 98.2 F (36.8 C) Oral 69 20 100 % 5' 9"  (1.753 m) 123 kg   Physical Exam  Nursing note and vitals reviewed. Constitutional: She is oriented to person, place, and time. She appears well-developed and well-nourished. No distress.  HENT:  Head: Normocephalic and atraumatic.  Neck: Normal range of motion.  Cardiovascular: Normal rate, regular rhythm and normal heart sounds.  Respiratory: Effort normal and breath sounds normal. No respiratory distress. She has no wheezes. She has no rales.  GI: Soft. She exhibits no distension. There is no abdominal tenderness.  gravid  Musculoskeletal: Normal range of motion.        General: Edema  (trace to LE) present.  Neurological: She is alert and oriented to person, place, and time.  Skin: Skin is warm and dry.  Psychiatric: She has a normal mood and affect.  EFM: 135 bpm, mod variability, + accels, no decels Toco: none  Results for orders placed or performed during the hospital encounter of 10/16/19 (from the past 24 hour(s))  Urinalysis, Routine w reflex microscopic     Status: Abnormal   Collection Time: 10/16/19 12:39 PM  Result Value Ref Range   Color, Urine YELLOW YELLOW   APPearance HAZY (A) CLEAR   Specific Gravity, Urine 1.018 1.005 - 1.030   pH 6.0 5.0 - 8.0   Glucose, UA NEGATIVE NEGATIVE mg/dL   Hgb urine dipstick NEGATIVE NEGATIVE   Bilirubin Urine NEGATIVE NEGATIVE   Ketones, ur NEGATIVE NEGATIVE mg/dL   Protein, ur 100 (A) NEGATIVE mg/dL   Nitrite NEGATIVE NEGATIVE   Leukocytes,Ua NEGATIVE NEGATIVE   RBC / HPF 0-5 0 - 5 RBC/hpf   WBC, UA 0-5 0 - 5 WBC/hpf   Bacteria, UA NONE SEEN NONE SEEN   Squamous Epithelial / LPF 0-5 0 - 5  CBC     Status: Abnormal   Collection Time: 10/16/19 12:57 PM  Result Value Ref Range   WBC 12.0 (H) 4.0 - 10.5 K/uL   RBC 3.25 (L) 3.87 - 5.11 MIL/uL   Hemoglobin 9.3 (L) 12.0 - 15.0 g/dL   HCT 28.0 (L) 36.0 - 46.0 %   MCV 86.2 80.0 - 100.0 fL   MCH 28.6 26.0 - 34.0 pg   MCHC 33.2 30.0 - 36.0 g/dL   RDW 13.1 11.5 - 15.5 %   Platelets 208 150 - 400 K/uL   nRBC 0.3 (H) 0.0 - 0.2 %  Comprehensive metabolic panel     Status: Abnormal   Collection Time: 10/16/19 12:57 PM  Result Value Ref Range   Sodium 135 135 - 145 mmol/L   Potassium 3.5 3.5 - 5.1 mmol/L   Chloride 104 98 - 111 mmol/L   CO2 20 (L) 22 - 32 mmol/L   Glucose, Bld 86 70 - 99 mg/dL   BUN 6 6 - 20 mg/dL   Creatinine, Ser 0.70 0.44 - 1.00 mg/dL   Calcium 8.7 (L) 8.9 - 10.3 mg/dL   Total Protein 5.8 (L) 6.5 - 8.1 g/dL   Albumin 2.7 (L) 3.5 - 5.0 g/dL   AST 33  15 - 41 U/L   ALT 24 0 - 44 U/L   Alkaline Phosphatase 73 38 - 126 U/L   Total Bilirubin 0.4 0.3  - 1.2 mg/dL   GFR calc non Af Amer >60 >60 mL/min   GFR calc Af Amer >60 >60 mL/min   Anion gap 11 5 - 15   MAU Course  Procedures  MDM Labs and BPP ordered and reviewed. PEC labs stable, no changes. BPP 8/8>10/10 with RNST, breech. HA resolved after Fioricet. Consult with Dr. Elly Modena, plan for outpt mngt. Instructed pt to bring home BP cuff to next appt to validate accuracy. Stable for discharge home. Prior to d/c pt requesting eval of breast lump. Noticed it yesterday, feels tender. Denies skin changes or nipple discharge. Chaperone present for exam. Small, tender, firm lump approx 4-48m at 11 o'clock palpated on right breast. Suspect blocked milk gland. Recommend heat and massage daily. Instructed pt to notify MD if not improving or becomes larger then would consider imaging.   Assessment and Plan   1. [redacted] weeks gestation of pregnancy   2. Supervision of other normal pregnancy, antepartum   3. Decreased fetal movement   4. NST (non-stress test) reactive   5. Pre-eclampsia in third trimester   6. Acute non intractable tension-type headache   7. Breast lump    [facility] FMCs Tylenol prn Fioricet prn  Allergies as of 10/16/2019   No Known Allergies     Medication List    STOP taking these medications   ondansetron 4 MG disintegrating tablet Commonly known as: Zofran ODT   promethazine 12.5 MG tablet Commonly known as: PHENERGAN   terconazole 0.4 % vaginal cream Commonly known as: TERAZOL 7     TAKE these medications   Accu-Chek FastClix Lancets Misc 1 Device by Percutaneous route 4 (four) times daily.   Accu-Chek Guide test strip Generic drug: glucose blood Use as instructed   Blood Pressure Monitoring Kit 1 kit by Does not apply route once a week.   Butalbital-APAP-Caffeine 50-325-40 MG capsule Take 1-2 capsules by mouth every 6 (six) hours as needed for headache.   cetirizine-pseudoephedrine  5-120 MG tablet Commonly known as: ZYRTEC-D Take 1 tablet by mouth as needed for allergies.   ferrous sulfate 325 (65 FE) MG EC tablet Take 1 tablet (325 mg total) by mouth 3 (three) times daily with meals.   pantoprazole 40 MG tablet Commonly known as: Protonix Take 1 tablet (40 mg total) by mouth daily.   PrePLUS 27-1 MG Tabs Take 1 tablet by mouth daily.   valACYclovir 500 MG tablet Commonly known as: VALTREX Take 1 tablet (500 mg total) by mouth 2 (two) times daily.      MJulianne Handler CNM 10/16/2019, 3:50 PM

## 2019-10-17 ENCOUNTER — Other Ambulatory Visit: Payer: Self-pay

## 2019-10-17 ENCOUNTER — Inpatient Hospital Stay (HOSPITAL_COMMUNITY)
Admission: AD | Admit: 2019-10-17 | Discharge: 2019-10-18 | Disposition: A | Discharge status: Home / Self Care | Payer: BC Managed Care – PPO | Attending: Obstetrics and Gynecology | Admitting: Obstetrics and Gynecology

## 2019-10-17 DIAGNOSIS — Z3A30 30 weeks gestation of pregnancy: Secondary | ICD-10-CM | POA: Insufficient documentation

## 2019-10-17 DIAGNOSIS — J45909 Unspecified asthma, uncomplicated: Secondary | ICD-10-CM | POA: Insufficient documentation

## 2019-10-17 DIAGNOSIS — O163 Unspecified maternal hypertension, third trimester: Secondary | ICD-10-CM | POA: Insufficient documentation

## 2019-10-17 DIAGNOSIS — O1493 Unspecified pre-eclampsia, third trimester: Secondary | ICD-10-CM | POA: Insufficient documentation

## 2019-10-17 DIAGNOSIS — O1490 Unspecified pre-eclampsia, unspecified trimester: Secondary | ICD-10-CM | POA: Diagnosis present

## 2019-10-17 DIAGNOSIS — O99513 Diseases of the respiratory system complicating pregnancy, third trimester: Secondary | ICD-10-CM | POA: Insufficient documentation

## 2019-10-17 DIAGNOSIS — Z87891 Personal history of nicotine dependence: Secondary | ICD-10-CM | POA: Insufficient documentation

## 2019-10-18 ENCOUNTER — Encounter (HOSPITAL_COMMUNITY): Payer: Self-pay

## 2019-10-18 DIAGNOSIS — O163 Unspecified maternal hypertension, third trimester: Secondary | ICD-10-CM | POA: Diagnosis not present

## 2019-10-18 DIAGNOSIS — O99513 Diseases of the respiratory system complicating pregnancy, third trimester: Secondary | ICD-10-CM | POA: Diagnosis not present

## 2019-10-18 DIAGNOSIS — J45909 Unspecified asthma, uncomplicated: Secondary | ICD-10-CM | POA: Diagnosis not present

## 2019-10-18 DIAGNOSIS — Z3A30 30 weeks gestation of pregnancy: Secondary | ICD-10-CM

## 2019-10-18 DIAGNOSIS — O1493 Unspecified pre-eclampsia, third trimester: Secondary | ICD-10-CM | POA: Diagnosis present

## 2019-10-18 DIAGNOSIS — Z87891 Personal history of nicotine dependence: Secondary | ICD-10-CM | POA: Diagnosis not present

## 2019-10-18 LAB — COMPREHENSIVE METABOLIC PANEL
ALT: 22 U/L (ref 0–44)
AST: 28 U/L (ref 15–41)
Albumin: 2.5 g/dL — ABNORMAL LOW (ref 3.5–5.0)
Alkaline Phosphatase: 73 U/L (ref 38–126)
Anion gap: 10 (ref 5–15)
BUN: 6 mg/dL (ref 6–20)
CO2: 21 mmol/L — ABNORMAL LOW (ref 22–32)
Calcium: 8.7 mg/dL — ABNORMAL LOW (ref 8.9–10.3)
Chloride: 105 mmol/L (ref 98–111)
Creatinine, Ser: 0.63 mg/dL (ref 0.44–1.00)
GFR calc Af Amer: >60 mL/min (ref >60)
GFR calc non Af Amer: >60 mL/min (ref >60)
Glucose, Bld: 98 mg/dL (ref 70–99)
Potassium: 3.8 mmol/L (ref 3.5–5.1)
Sodium: 136 mmol/L (ref 135–145)
Total Bilirubin: 0.2 mg/dL — ABNORMAL LOW (ref 0.3–1.2)
Total Protein: 5.5 g/dL — ABNORMAL LOW (ref 6.5–8.1)

## 2019-10-18 LAB — PROTEIN / CREATININE RATIO, URINE
Creatinine, Urine: 78.2 mg/dL
Protein Creatinine Ratio: 0.96 mg/mg{Cre} — ABNORMAL HIGH (ref 0.00–0.15)
Total Protein, Urine: 75 mg/dL

## 2019-10-18 LAB — CBC
HCT: 29.1 % — ABNORMAL LOW (ref 36.0–46.0)
Hemoglobin: 9.6 g/dL — ABNORMAL LOW (ref 12.0–15.0)
MCH: 28.7 pg (ref 26.0–34.0)
MCHC: 33 g/dL (ref 30.0–36.0)
MCV: 86.9 fL (ref 80.0–100.0)
Platelets: 207 10*3/uL (ref 150–400)
RBC: 3.35 MIL/uL — ABNORMAL LOW (ref 3.87–5.11)
RDW: 13.1 % (ref 11.5–15.5)
WBC: 9.4 10*3/uL (ref 4.0–10.5)
nRBC: 0 % (ref 0.0–0.2)

## 2019-10-18 MED ORDER — LABETALOL HCL 5 MG/ML IV SOLN: 80.0000 mg | INTRAVENOUS | Status: DC | PRN

## 2019-10-18 MED ORDER — LABETALOL HCL 5 MG/ML IV SOLN: 40.0000 mg | INTRAVENOUS | Status: DC | PRN

## 2019-10-18 MED ORDER — CYCLOBENZAPRINE HCL 10 MG PO TABS
10.0000 mg | ORAL_TABLET | Freq: Once | ORAL | Status: AC
  Administered 2019-10-18: 10 mg via ORAL
  Filled 2019-10-18: qty 1

## 2019-10-18 MED ORDER — LABETALOL HCL 5 MG/ML IV SOLN
20.0000 mg | INTRAVENOUS | Status: DC | PRN
  Administered 2019-10-18: 20 mg via INTRAVENOUS
  Filled 2019-10-18: qty 4

## 2019-10-18 MED ORDER — HYDRALAZINE HCL 20 MG/ML IJ SOLN: 10.0000 mg | INTRAMUSCULAR | Status: DC | PRN

## 2019-10-18 MED ORDER — LORATADINE 10 MG PO TABS: 10.0000 mg | ORAL_TABLET | Freq: Every day | ORAL | Status: DC

## 2019-10-18 NOTE — MAU Note (Signed)
Patient presents to MAU c/o HTN. Patient reports her BP has been high on her monitor at home. Patient reports last BP reading 171/117 around 2300.  Patient reports +FM, denies vaginal bleeding or LOF. Patient reports slight headache, denies visual disturbances, has not taken anything for it.

## 2019-10-18 NOTE — MAU Provider Note (Signed)
History     CSN: [id number]  Arrival date and time: 10/17/19 2344   First Provider Initiated Contact with Patient 10/18/19 0030      Chief Complaint  Patient presents with  . Hypertension   HPI  Ms.  Jessica Vasquez is a 28 y.o. year old [id number] female at [redacted]w[redacted]d weeks gestation who presents to MAU reporting elevated BPs of 171/117 @ 2300 at home. She denies any VB or LOF. She reports (+) FM. She has a "slight" H/A and has not taken any medication. She also reports some swelling in both feet. She was dx'd with gHTN at 28 wks then PEC without severe features on 10/14/19. She has not been Rx'd medication for her BP. She receives [facility] at [facility]; next appt is 10/28/2019. She also brought her BP cuff with her tonight to have it tested to see if her cuff readings are accurate.  Past Medical History:  Diagnosis Date  . Anemia   . Asthma   . Cholelithiasis   . Headache   . Herpes    last outbreak two weeks ago ( mid march 2015)  . Hydradenitis   . Sleep apnea   . Trichimoniasis 01/14/2019    Past Surgical History:  Procedure Laterality Date  . CHOLECYSTECTOMY N/A 10/15/2015   Procedure: LAPAROSCOPIC CHOLECYSTECTOMY WITH INTRAOPERATIVE CHOLANGIOGRAM;  Surgeon: Manus Rudd, MD;  Location: [facility] OR;  Service: General;  Laterality: N/A;  . ERCP N/A 10/16/2015   Procedure: ENDOSCOPIC RETROGRADE CHOLANGIOPANCREATOGRAPHY (ERCP);  Surgeon: Iva Boop, MD;  Location: [facility] ENDOSCOPY;  Service: Endoscopy;  Laterality: N/A;  . LAPAROSCOPIC CHOLECYSTECTOMY W/ CHOLANGIOGRAPHY    . nasal sugery      Family History  Problem Relation Age of Onset  . Gallstones Mother   . Arthritis Mother   . ADD / ADHD Brother   . Diabetes Maternal Grandmother   . Heart disease Maternal Grandmother   . Hypertension Maternal Grandmother   . Stroke Maternal Grandmother     Social History   Tobacco Use  . Smoking status: Former Smoker    Quit date: 05/16/2011    Years since quitting: 8.4  . Smokeless tobacco:  Never Used  Substance Use Topics  . Alcohol use: No  . Drug use: No    Allergies: No Known Allergies  No medications prior to admission.    Review of Systems  Constitutional: Negative.   HENT: Negative.   Eyes: Negative.   Respiratory: Negative.   Cardiovascular: Positive for leg swelling.  Gastrointestinal: Negative.   Endocrine: Negative.   Genitourinary: Negative.   Musculoskeletal: Negative.   Skin: Negative.   Allergic/Immunologic: Negative.   Neurological: Positive for headaches.  Hematological: Negative.   Psychiatric/Behavioral: Negative.    Physical Exam   Patient Vitals for the past 24 hrs:  BP Temp Temp src Pulse Resp SpO2 Weight  10/18/19 0247 115/61 98.4 F (36.9 C) Oral 90 16 100 % -  10/18/19 0230 120/64 - - 87 - - -  10/18/19 0215 121/65 - - 84 - - -  10/18/19 0200 115/67 - - 90 - - -  10/18/19 0146 (!) 141/80 - - 78 - - -  10/18/19 0130 129/71 - - 72 - - -  10/18/19 0116 132/79 - - 74 - - -  10/18/19 0115 136/79 - - 78 - - -  10/18/19 0100 (!) 153/82 - - 74 - - -  10/18/19 0046 (!) 159/104 - - 76 - - -  10/18/19 0031 (!) 152/99 - -  76 - - -  10/18/19 0022 (!) 153/110 - - 82 - - -  10/18/19 0003 (!) 160/112 98.2 F (36.8 C) Oral 84 19 100 % 123.1 kg  *0247 reading on patient's home cuff was 140/96  Physical Exam  Nursing note and vitals reviewed. Constitutional: She is oriented to person, place, and time. She appears well-developed and well-nourished.  HENT:  Head: Normocephalic and atraumatic.  Eyes: Pupils are equal, round, and reactive to light.  Neck: Normal range of motion.  Cardiovascular: Normal rate.  GI: Soft.  Genitourinary:    Genitourinary Comments: Pelvic deferred   Musculoskeletal: Normal range of motion.        General: Edema (1+) present.  Neurological: She is alert and oriented to person, place, and time. She has normal reflexes.  Skin: Skin is warm and dry.  Psychiatric: She has a normal mood and affect. Her behavior is  normal. Judgment and thought content normal.   NST - FHR: 145 bpm / moderate variability / accels present / decels absent / TOCO: none   MAU Course  Procedures  MDM CCUA CBC CMP P/C Ratio Serial BP's   Labetalol Protocol  Flexeril 10 mg -- H/A improved ("only feel some pain when moving head around")  *Consult with Dr. Rip Harbour @ 0228 - notified of patient's complaints, assessments, lab & NST results - recommended tx plan d/c home with strict precautions on when to return to MAU (does not meet criteria for PEC with SF since H/A improved, needs OB appt this week instead of on 10/28/19  Results for orders placed or performed during the hospital encounter of 10/17/19 (from the past 24 hour(s))  Protein / creatinine ratio, urine     Status: Abnormal   Collection Time: 10/18/19 12:24 AM  Result Value Ref Range   Creatinine, Urine 78.20 mg/dL   Total Protein, Urine 75 mg/dL   Protein Creatinine Ratio 0.96 (H) 0.00 - 0.15 mg/mg[Cre]  CBC     Status: Abnormal   Collection Time: 10/18/19 12:43 AM  Result Value Ref Range   WBC 9.4 4.0 - 10.5 K/uL   RBC 3.35 (L) 3.87 - 5.11 MIL/uL   Hemoglobin 9.6 (L) 12.0 - 15.0 g/dL   HCT 29.1 (L) 36.0 - 46.0 %   MCV 86.9 80.0 - 100.0 fL   MCH 28.7 26.0 - 34.0 pg   MCHC 33.0 30.0 - 36.0 g/dL   RDW 13.1 11.5 - 15.5 %   Platelets 207 150 - 400 K/uL   nRBC 0.0 0.0 - 0.2 %  Comprehensive metabolic panel     Status: Abnormal   Collection Time: 10/18/19 12:43 AM  Result Value Ref Range   Sodium 136 135 - 145 mmol/L   Potassium 3.8 3.5 - 5.1 mmol/L   Chloride 105 98 - 111 mmol/L   CO2 21 (L) 22 - 32 mmol/L   Glucose, Bld 98 70 - 99 mg/dL   BUN 6 6 - 20 mg/dL   Creatinine, Ser 0.63 0.44 - 1.00 mg/dL   Calcium 8.7 (L) 8.9 - 10.3 mg/dL   Total Protein 5.5 (L) 6.5 - 8.1 g/dL   Albumin 2.5 (L) 3.5 - 5.0 g/dL   AST 28 15 - 41 U/L   ALT 22 0 - 44 U/L   Alkaline Phosphatase 73 38 - 126 U/L   Total Bilirubin 0.2 (L) 0.3 - 1.2 mg/dL   GFR calc non Af Amer  >60 >60 mL/min   GFR calc Af Amer >60 >60 mL/min  Anion gap 10 5 - 15      Assessment and Plan  Pre-eclampsia in third trimester - Plan: Discharge patient - Information provided on PEC - Advised to return to MAU for BP readings of >140/90, H/A that is not resolved with Fioricet at home and painful contractions - Advised to call [facility] after 3 pm, if no one has called to get her scheduled for this week  [facility] preferably [facility]. - Follow-up with [facility] as stated above - Patient verbalized an understanding of the plan of care and agrees.   Raelyn Moraolitta Denver Harder, MSN, CNM 10/18/2019, 12:30 AM

## 2019-10-18 NOTE — MAU Note (Addendum)
Correct 15 min BP readings during downtime are as follows:  141/80 prior to downtime @ 0146 135/70 136/79 140/75    Gilmer Mor RN

## 2019-10-18 NOTE — Discharge Instructions (Signed)
Please return to MAU for blood pressure greater than 140/90, headache that is not relieved or improved with Fioricet that you have prescribed at home, vaginal bleeding like a period, contractions that are so painful you cannot walk.

## 2019-10-20 ENCOUNTER — Other Ambulatory Visit: Payer: Self-pay

## 2019-10-20 ENCOUNTER — Inpatient Hospital Stay (EMERGENCY_DEPARTMENT_HOSPITAL)
Admission: AD | Admit: 2019-10-20 | Discharge: 2019-10-20 | Disposition: A | Discharge status: Home / Self Care | Payer: BC Managed Care – PPO | Source: Home / Self Care | Attending: Family Medicine | Admitting: Family Medicine

## 2019-10-20 ENCOUNTER — Encounter (HOSPITAL_COMMUNITY): Payer: Self-pay

## 2019-10-20 DIAGNOSIS — Z3A31 31 weeks gestation of pregnancy: Secondary | ICD-10-CM

## 2019-10-20 DIAGNOSIS — O1493 Unspecified pre-eclampsia, third trimester: Secondary | ICD-10-CM

## 2019-10-20 DIAGNOSIS — O1414 Severe pre-eclampsia complicating childbirth: Secondary | ICD-10-CM | POA: Diagnosis not present

## 2019-10-20 DIAGNOSIS — O163 Unspecified maternal hypertension, third trimester: Secondary | ICD-10-CM | POA: Insufficient documentation

## 2019-10-20 LAB — COMPREHENSIVE METABOLIC PANEL
ALT: 19 U/L (ref 0–44)
AST: 28 U/L (ref 15–41)
Albumin: 2.3 g/dL — ABNORMAL LOW (ref 3.5–5.0)
Alkaline Phosphatase: 82 U/L (ref 38–126)
Anion gap: 8 (ref 5–15)
BUN: 5 mg/dL — ABNORMAL LOW (ref 6–20)
CO2: 19 mmol/L — ABNORMAL LOW (ref 22–32)
Calcium: 8.2 mg/dL — ABNORMAL LOW (ref 8.9–10.3)
Chloride: 107 mmol/L (ref 98–111)
Creatinine, Ser: 0.64 mg/dL (ref 0.44–1.00)
GFR calc Af Amer: >60 mL/min (ref >60)
GFR calc non Af Amer: >60 mL/min (ref >60)
Glucose, Bld: 150 mg/dL — ABNORMAL HIGH (ref 70–99)
Potassium: 3.9 mmol/L (ref 3.5–5.1)
Sodium: 134 mmol/L — ABNORMAL LOW (ref 135–145)
Total Bilirubin: <0.1 mg/dL — ABNORMAL LOW (ref 0.3–1.2)
Total Protein: 4.9 g/dL — ABNORMAL LOW (ref 6.5–8.1)

## 2019-10-20 LAB — URINALYSIS, ROUTINE W REFLEX MICROSCOPIC
Bilirubin Urine: NEGATIVE
Glucose, UA: NEGATIVE mg/dL
Ketones, ur: NEGATIVE mg/dL
Nitrite: NEGATIVE
Protein, ur: >=300 mg/dL — AB
Specific Gravity, Urine: 1.011 (ref 1.005–1.030)
pH: 6 (ref 5.0–8.0)

## 2019-10-20 LAB — CBC
HCT: 29 % — ABNORMAL LOW (ref 36.0–46.0)
Hemoglobin: 9.5 g/dL — ABNORMAL LOW (ref 12.0–15.0)
MCH: 28.2 pg (ref 26.0–34.0)
MCHC: 32.8 g/dL (ref 30.0–36.0)
MCV: 86.1 fL (ref 80.0–100.0)
Platelets: 208 10*3/uL (ref 150–400)
RBC: 3.37 MIL/uL — ABNORMAL LOW (ref 3.87–5.11)
RDW: 13 % (ref 11.5–15.5)
WBC: 8.7 10*3/uL (ref 4.0–10.5)
nRBC: 0 % (ref 0.0–0.2)

## 2019-10-20 LAB — PROTEIN / CREATININE RATIO, URINE
Creatinine, Urine: 78.74 mg/dL
Protein Creatinine Ratio: 4.65 mg/mg{Cre} — ABNORMAL HIGH (ref 0.00–0.15)
Total Protein, Urine: 366 mg/dL

## 2019-10-20 MED ORDER — HYDRALAZINE HCL 20 MG/ML IJ SOLN: 10.0000 mg | INTRAMUSCULAR | Status: DC | PRN

## 2019-10-20 MED ORDER — LABETALOL HCL 5 MG/ML IV SOLN: 80.0000 mg | INTRAVENOUS | Status: DC | PRN

## 2019-10-20 MED ORDER — LABETALOL HCL 5 MG/ML IV SOLN: 40.0000 mg | INTRAVENOUS | Status: DC | PRN

## 2019-10-20 MED ORDER — LACTATED RINGERS IV SOLN: INTRAVENOUS | Status: DC

## 2019-10-20 MED ORDER — LABETALOL HCL 5 MG/ML IV SOLN: 20.0000 mg | INTRAVENOUS | Status: DC | PRN

## 2019-10-20 NOTE — Discharge Instructions (Signed)
Hypertension During Pregnancy °High blood pressure (hypertension) is when the force of blood pumping through the arteries is too strong. Arteries are blood vessels that carry blood from the heart throughout the body. Hypertension during pregnancy can be mild or severe. Severe hypertension during pregnancy (preeclampsia) is a medical emergency that requires prompt evaluation and treatment. °Different types of hypertension can happen during pregnancy. These include: °· Chronic hypertension. This happens when you had high blood pressure before you became pregnant, and it continues during the pregnancy. Hypertension that develops before you are [redacted] weeks pregnant and continues during the pregnancy is also called chronic hypertension. If you have chronic hypertension, it will not go away after you have your baby. You will need follow-up visits with your health care provider after you have your baby. Your doctor may want you to keep taking medicine for your blood pressure. °· Gestational hypertension. This is hypertension that develops after the 20th week of pregnancy. Gestational hypertension usually goes away after you have your baby, but your health care provider will need to monitor your blood pressure to make sure that it is getting better. °· Preeclampsia. This is severe hypertension during pregnancy. This can cause serious complications for you and your baby and can also cause complications for you after the delivery of your baby. °· Postpartum preeclampsia. You may develop severe hypertension after giving birth. This usually occurs within 48 hours after childbirth but may occur up to 6 weeks after giving birth. This is rare. °How does this affect me? °Women who have hypertension during pregnancy have a greater chance of developing hypertension later in life or during future pregnancies. In some cases, hypertension during pregnancy can cause serious complications, such as: °· Stroke. °· Heart attack. °· Injury to  other organs, such as kidneys, lungs, or liver. °· Preeclampsia. °· Convulsions or seizures. °· Placental abruption. °How does this affect my baby? °Hypertension during pregnancy can affect your baby. Your baby may: °· Be born early (prematurely). °· Not weigh as much as he or she should at birth (low birth weight). °· Not tolerate labor well, leading to an unplanned cesarean delivery. °What are the risks? °There are certain factors that make it more likely for you to develop hypertension during pregnancy. These include: °· Having hypertension during a previous pregnancy. °· Being overweight. °· Being age 35 or older. °· Being pregnant for the first time. °· Being pregnant with more than one baby. °· Becoming pregnant using fertilization methods, such as IVF (in vitro fertilization). °· Having other medical problems, such as diabetes, kidney disease, or lupus. °· Having a family history of hypertension. °What can I do to lower my risk? °The exact cause of hypertension during pregnancy is not known. You may be able to lower your risk by: °· Maintaining a healthy weight. °· Eating a healthy and balanced diet. °· Following your health care provider's instructions about treating any long-term conditions that you had before becoming pregnant. °It is very important to keep all of your prenatal care appointments. Your health care provider will check your blood pressure and make sure that your pregnancy is progressing as expected. If a problem is found, early treatment can prevent complications. °How is this treated? °Treatment for hypertension during pregnancy varies depending on the type of hypertension you have and how serious it is. °· If you were taking medicine for high blood pressure before you became pregnant, talk with your health care provider. You may need to change medicine during pregnancy because   some medicines, like ACE inhibitors, may not be considered safe for your baby. °· If you have gestational  hypertension, your health care provider may order medicine to treat this during pregnancy. °· If you are at risk for preeclampsia, your health care provider may recommend that you take a low-dose aspirin during your pregnancy. °· If you have severe hypertension, you may need to be hospitalized so you and your baby can be monitored closely. You may also need to be given medicine to lower your blood pressure. This medicine may be given by mouth or through an IV. °· In some cases, if your condition gets worse, you may need to deliver your baby early. °Follow these instructions at home: °Eating and drinking ° °· Drink enough fluid to keep your urine pale yellow. °· Avoid caffeine. °Lifestyle °· Do not use any products that contain nicotine or tobacco, such as cigarettes, e-cigarettes, and chewing tobacco. If you need help quitting, ask your health care provider. °· Do not use alcohol or drugs. °· Avoid stress as much as possible. °· Rest and get plenty of sleep. °· Regular exercise can help to reduce your blood pressure. Ask your health care provider what kinds of exercise are best for you. °General instructions °· Take over-the-counter and prescription medicines only as told by your health care provider. °· Keep all prenatal and follow-up visits as told by your health care provider. This is important. °Contact a health care provider if: °· You have symptoms that your health care provider told you may require more treatment or monitoring, such as: °? Headaches. °? Nausea or vomiting. °? Abdominal pain. °? Dizziness. °? Light-headedness. °Get help right away if: °· You have: °? Severe abdominal pain that does not get better with treatment. °? A severe headache that does not get better. °? Vomiting that does not get better. °? Sudden, rapid weight gain. °? Sudden swelling in your hands, ankles, or face. °? Vaginal bleeding. °? Blood in your urine. °? Blurred or double vision. °? Shortness of breath or chest  pain. °? Weakness on one side of your body. °? Difficulty speaking. °· Your baby is not moving as much as usual. °Summary °· High blood pressure (hypertension) is when the force of blood pumping through the arteries is too strong. °· Hypertension during pregnancy can cause problems for you and your baby. °· Treatment for hypertension during pregnancy varies depending on the type of hypertension you have and how serious it is. °· Keep all prenatal and follow-up visits as told by your health care provider. This is important. °This information is not intended to replace advice given to you by your health care provider. Make sure you discuss any questions you have with your health care provider. °Document Released: 08/21/2011 Document Revised: 03/26/2019 Document Reviewed: 12/30/2018 °Elsevier Patient Education © 2020 [employer] ° ° ° °Fetal Movement Counts °Patient Name: ________________________________________________ Patient Due Date: ____________________ °What is a fetal movement count? ° °A fetal movement count is the number of times that you feel your baby move during a certain amount of time. This may also be called a fetal kick count. A fetal movement count is recommended for every pregnant woman. You may be asked to start counting fetal movements as early as week 28 of your pregnancy. °Pay attention to when your baby is most active. You may notice your baby's sleep and wake cycles. You may also notice things that make your baby move more. You should do a fetal movement count: °·   When your baby is normally most active. °· At the same time each day. °A good time to count movements is while you are resting, after having something to eat and drink. °How do I count fetal movements? °1. Find a quiet, comfortable area. Sit, or lie down on your side. °2. Write down the date, the start time and stop time, and the number of movements that you felt between those two times. Take this information with you to your health  care visits. °3. For 2 hours, count kicks, flutters, swishes, rolls, and jabs. You should feel at least 10 movements during 2 hours. °4. You may stop counting after you have felt 10 movements. °5. If you do not feel 10 movements in 2 hours, have something to eat and drink. Then, keep resting and counting for 1 hour. If you feel at least 4 movements during that hour, you may stop counting. °Contact a health care provider if: °· You feel fewer than 4 movements in 2 hours. °· Your baby is not moving like he or she usually does. °Date: ____________ Start time: ____________ Stop time: ____________ Movements: ____________ °Date: ____________ Start time: ____________ Stop time: ____________ Movements: ____________ °Date: ____________ Start time: ____________ Stop time: ____________ Movements: ____________ °Date: ____________ Start time: ____________ Stop time: ____________ Movements: ____________ °Date: ____________ Start time: ____________ Stop time: ____________ Movements: ____________ °Date: ____________ Start time: ____________ Stop time: ____________ Movements: ____________ °Date: ____________ Start time: ____________ Stop time: ____________ Movements: ____________ °Date: ____________ Start time: ____________ Stop time: ____________ Movements: ____________ °Date: ____________ Start time: ____________ Stop time: ____________ Movements: ____________ °This information is not intended to replace advice given to you by your health care provider. Make sure you discuss any questions you have with your health care provider. °Document Released: 01/02/2007 Document Revised: 12/23/2018 Document Reviewed: 01/12/2016 °Elsevier Patient Education © 2020 [employer] ° °

## 2019-10-20 NOTE — MAU Provider Note (Signed)
Chief Complaint  Patient presents with  . Hypertension     First Provider Initiated Contact with Patient 10/20/19 1850      S: Jessica Vasquez  is a 28 y.o. y.o. year old [id number] female at [redacted]w[redacted]d weeks gestation who presents to [facility]U with elevated blood pressures. Recently diagnosed with preeclampsia. Denies any history of hypertension prior to pregnancy. Current blood pressure medication: none  Associated symptoms: denies Headache, denies vision changes, denies epigastric pain Contractions: denies Vaginal bleeding: denies Fetal movement: good  O:  Patient Vitals for the past 24 hrs:  BP Temp Temp src Pulse Resp SpO2 Height Weight  10/20/19 2002 (!) 158/85 - - 81 - - - -  10/20/19 1946 (!) 145/94 - - 92 - - - -  10/20/19 1935 - - - - - 100 % - -  10/20/19 1931 (!) 154/97 - - 85 - - - -  10/20/19 1930 - - - - - 99 % - -  10/20/19 1925 - - - - - 100 % - -  10/20/19 1920 - - - - - 99 % - -  10/20/19 1916 (!) 154/98 - - 83 18 - - -  10/20/19 1915 - - - - - 100 % - -  10/20/19 1910 - - - - - 100 % - -  10/20/19 1905 - - - - - 100 % - -  10/20/19 1901 (!) 146/99 - - 90 - 100 % - -  10/20/19 1855 - - - - - 100 % - -  10/20/19 1850 - - - - - 100 % - -  10/20/19 1846 (!) 157/91 - - 87 - - - -  10/20/19 1845 - - - - - 100 % - -  10/20/19 1840 - - - - - 100 % - -  10/20/19 1837 (!) 164/109 99.1 F (37.3 C) Oral 96 18 100 % - -  10/20/19 1826 - - - - - - 5\' 9"  (1.753 m) 122.6 kg   General: NAD Heart: Regular rate Lungs: Normal rate and effort Abd: Soft, NT, Gravid, S=D Extremities: 2+ Pedal edema Neuro: 2+ deep tendon reflexes, No clonus  NST:  Baseline: 145 bpm, Variability: Good {> 6 bpm), Accelerations: Reactive and Decelerations: Absent  Results for orders placed or performed during the hospital encounter of 10/20/19 (from the past 24 hour(s))  Urinalysis, Routine w reflex microscopic     Status: Abnormal   Collection Time: 10/20/19  6:22 PM  Result Value Ref Range   Color, Urine  YELLOW YELLOW   APPearance CLOUDY (A) CLEAR   Specific Gravity, Urine 1.011 1.005 - 1.030   pH 6.0 5.0 - 8.0   Glucose, UA NEGATIVE NEGATIVE mg/dL   Hgb urine dipstick SMALL (A) NEGATIVE   Bilirubin Urine NEGATIVE NEGATIVE   Ketones, ur NEGATIVE NEGATIVE mg/dL   Protein, ur 13/03/20 (A) NEGATIVE mg/dL   Nitrite NEGATIVE NEGATIVE   Leukocytes,Ua LARGE (A) NEGATIVE   RBC / HPF 0-5 0 - 5 RBC/hpf   WBC, UA 6-10 0 - 5 WBC/hpf   Bacteria, UA RARE (A) NONE SEEN   Squamous Epithelial / LPF 0-5 0 - 5  Comprehensive metabolic panel     Status: Abnormal   Collection Time: 10/20/19  6:51 PM  Result Value Ref Range   Sodium 134 (L) 135 - 145 mmol/L   Potassium 3.9 3.5 - 5.1 mmol/L   Chloride 107 98 - 111 mmol/L   CO2 19 (L) 22 - 32 mmol/L  Glucose, Bld 150 (H) 70 - 99 mg/dL   BUN 5 (L) 6 - 20 mg/dL   Creatinine, Ser 0.64 0.44 - 1.00 mg/dL   Calcium 8.2 (L) 8.9 - 10.3 mg/dL   Total Protein 4.9 (L) 6.5 - 8.1 g/dL   Albumin 2.3 (L) 3.5 - 5.0 g/dL   AST 28 15 - 41 U/L   ALT 19 0 - 44 U/L   Alkaline Phosphatase 82 38 - 126 U/L   Total Bilirubin <0.1 (L) 0.3 - 1.2 mg/dL   GFR calc non Af Amer >60 >60 mL/min   GFR calc Af Amer >60 >60 mL/min   Anion gap 8 5 - 15  CBC     Status: Abnormal   Collection Time: 10/20/19  6:51 PM  Result Value Ref Range   WBC 8.7 4.0 - 10.5 K/uL   RBC 3.37 (L) 3.87 - 5.11 MIL/uL   Hemoglobin 9.5 (L) 12.0 - 15.0 g/dL   HCT 29.0 (L) 36.0 - 46.0 %   MCV 86.1 80.0 - 100.0 fL   MCH 28.2 26.0 - 34.0 pg   MCHC 32.8 30.0 - 36.0 g/dL   RDW 13.0 11.5 - 15.5 %   Platelets 208 150 - 400 K/uL   nRBC 0.0 0.0 - 0.2 %    A: [redacted]w[redacted]d week IUP Preeclampsia without severe features -initial BP severe range but none other & did not require antihypertensives -no symptoms -PEC labs stable  P:  Discharge home in stable condition per consult with Truett Mainland, DO  Preeclampsia precautions. Pt has appointment with Dr. Rosana Hoes tomorrow morning Return to maternity admissions as  needed in emergencies  Jorje Guild, NP 10/20/2019 8:08 PM

## 2019-10-20 NOTE — MAU Note (Signed)
Jessica Vasquez is a 28 y.o. at [redacted]w[redacted]d here in MAU reporting: took BP at home and the lowest reading was 157/112. Is not on any HTN meds at home. Denies headache, blurry vision, or epigastric pain. +FM. States she is having vaginal pain since last night but no contractions, bleeding, or LOF.  Onset of complaint: ongoing  Pain score: 7/10  Vitals:   10/20/19 1837  BP: (!) 164/109  Pulse: 96  Resp: 18  Temp: 99.1 F (37.3 C)  SpO2: 100%      Lab orders placed from triage: UA

## 2019-10-21 ENCOUNTER — Inpatient Hospital Stay (HOSPITAL_COMMUNITY)
Admission: AD | Admit: 2019-10-21 | Discharge: 2019-11-04 | DRG: 784 | Disposition: A | Discharge status: Home / Self Care | Payer: BC Managed Care – PPO | Attending: Obstetrics and Gynecology | Admitting: Obstetrics and Gynecology

## 2019-10-21 ENCOUNTER — Inpatient Hospital Stay (HOSPITAL_COMMUNITY): Payer: BC Managed Care – PPO

## 2019-10-21 ENCOUNTER — Other Ambulatory Visit: Payer: Self-pay

## 2019-10-21 ENCOUNTER — Ambulatory Visit (INDEPENDENT_AMBULATORY_CARE_PROVIDER_SITE_OTHER): Payer: BC Managed Care – PPO | Admitting: Obstetrics and Gynecology

## 2019-10-21 ENCOUNTER — Encounter (HOSPITAL_COMMUNITY): Payer: Self-pay

## 2019-10-21 ENCOUNTER — Encounter: Payer: Self-pay | Admitting: Obstetrics and Gynecology

## 2019-10-21 VITALS — BP 146/102 | HR 88 | Wt 263.0 lb

## 2019-10-21 DIAGNOSIS — Z3480 Encounter for supervision of other normal pregnancy, unspecified trimester: Secondary | ICD-10-CM

## 2019-10-21 DIAGNOSIS — O9832 Other infections with a predominantly sexual mode of transmission complicating childbirth: Secondary | ICD-10-CM | POA: Diagnosis present

## 2019-10-21 DIAGNOSIS — O133 Gestational [pregnancy-induced] hypertension without significant proteinuria, third trimester: Secondary | ICD-10-CM | POA: Diagnosis not present

## 2019-10-21 DIAGNOSIS — Z98891 History of uterine scar from previous surgery: Secondary | ICD-10-CM

## 2019-10-21 DIAGNOSIS — Z3A32 32 weeks gestation of pregnancy: Secondary | ICD-10-CM | POA: Diagnosis not present

## 2019-10-21 DIAGNOSIS — O99824 Streptococcus B carrier state complicating childbirth: Secondary | ICD-10-CM | POA: Diagnosis present

## 2019-10-21 DIAGNOSIS — O24419 Gestational diabetes mellitus in pregnancy, unspecified control: Secondary | ICD-10-CM | POA: Diagnosis present

## 2019-10-21 DIAGNOSIS — O1493 Unspecified pre-eclampsia, third trimester: Secondary | ICD-10-CM

## 2019-10-21 DIAGNOSIS — O24420 Gestational diabetes mellitus in childbirth, diet controlled: Secondary | ICD-10-CM | POA: Diagnosis present

## 2019-10-21 DIAGNOSIS — J45909 Unspecified asthma, uncomplicated: Secondary | ICD-10-CM | POA: Diagnosis present

## 2019-10-21 DIAGNOSIS — Z3A31 31 weeks gestation of pregnancy: Secondary | ICD-10-CM

## 2019-10-21 DIAGNOSIS — D563 Thalassemia minor: Secondary | ICD-10-CM | POA: Diagnosis present

## 2019-10-21 DIAGNOSIS — O1413 Severe pre-eclampsia, third trimester: Secondary | ICD-10-CM | POA: Diagnosis not present

## 2019-10-21 DIAGNOSIS — A6000 Herpesviral infection of urogenital system, unspecified: Secondary | ICD-10-CM | POA: Diagnosis present

## 2019-10-21 DIAGNOSIS — O368390 Maternal care for abnormalities of the fetal heart rate or rhythm, unspecified trimester, not applicable or unspecified: Secondary | ICD-10-CM | POA: Diagnosis not present

## 2019-10-21 DIAGNOSIS — Z302 Encounter for sterilization: Secondary | ICD-10-CM

## 2019-10-21 DIAGNOSIS — O24410 Gestational diabetes mellitus in pregnancy, diet controlled: Secondary | ICD-10-CM

## 2019-10-21 DIAGNOSIS — O1414 Severe pre-eclampsia complicating childbirth: Principal | ICD-10-CM | POA: Diagnosis present

## 2019-10-21 DIAGNOSIS — O99354 Diseases of the nervous system complicating childbirth: Secondary | ICD-10-CM | POA: Diagnosis present

## 2019-10-21 DIAGNOSIS — B009 Herpesviral infection, unspecified: Secondary | ICD-10-CM

## 2019-10-21 DIAGNOSIS — O1410 Severe pre-eclampsia, unspecified trimester: Secondary | ICD-10-CM

## 2019-10-21 DIAGNOSIS — O99213 Obesity complicating pregnancy, third trimester: Secondary | ICD-10-CM

## 2019-10-21 DIAGNOSIS — O0990 Supervision of high risk pregnancy, unspecified, unspecified trimester: Secondary | ICD-10-CM

## 2019-10-21 DIAGNOSIS — Z87891 Personal history of nicotine dependence: Secondary | ICD-10-CM | POA: Diagnosis not present

## 2019-10-21 DIAGNOSIS — O24429 Gestational diabetes mellitus in childbirth, unspecified control: Secondary | ICD-10-CM | POA: Diagnosis not present

## 2019-10-21 DIAGNOSIS — O9952 Diseases of the respiratory system complicating childbirth: Secondary | ICD-10-CM | POA: Diagnosis present

## 2019-10-21 DIAGNOSIS — G4730 Sleep apnea, unspecified: Secondary | ICD-10-CM | POA: Diagnosis present

## 2019-10-21 DIAGNOSIS — O99013 Anemia complicating pregnancy, third trimester: Secondary | ICD-10-CM | POA: Diagnosis not present

## 2019-10-21 DIAGNOSIS — O368190 Decreased fetal movements, unspecified trimester, not applicable or unspecified: Secondary | ICD-10-CM

## 2019-10-21 DIAGNOSIS — Z20828 Contact with and (suspected) exposure to other viral communicable diseases: Secondary | ICD-10-CM | POA: Diagnosis present

## 2019-10-21 DIAGNOSIS — O289 Unspecified abnormal findings on antenatal screening of mother: Secondary | ICD-10-CM | POA: Diagnosis not present

## 2019-10-21 DIAGNOSIS — O1490 Unspecified pre-eclampsia, unspecified trimester: Secondary | ICD-10-CM

## 2019-10-21 DIAGNOSIS — B951 Streptococcus, group B, as the cause of diseases classified elsewhere: Secondary | ICD-10-CM | POA: Diagnosis present

## 2019-10-21 LAB — COMPREHENSIVE METABOLIC PANEL
ALT: 21 U/L (ref 0–44)
AST: 29 U/L (ref 15–41)
Albumin: 2.4 g/dL — ABNORMAL LOW (ref 3.5–5.0)
Alkaline Phosphatase: 92 U/L (ref 38–126)
Anion gap: 11 (ref 5–15)
BUN: 5 mg/dL — ABNORMAL LOW (ref 6–20)
CO2: 20 mmol/L — ABNORMAL LOW (ref 22–32)
Calcium: 8.6 mg/dL — ABNORMAL LOW (ref 8.9–10.3)
Chloride: 106 mmol/L (ref 98–111)
Creatinine, Ser: 0.7 mg/dL (ref 0.44–1.00)
GFR calc Af Amer: >60 mL/min (ref >60)
GFR calc non Af Amer: >60 mL/min (ref >60)
Glucose, Bld: 93 mg/dL (ref 70–99)
Potassium: 4 mmol/L (ref 3.5–5.1)
Sodium: 137 mmol/L (ref 135–145)
Total Bilirubin: 0.3 mg/dL (ref 0.3–1.2)
Total Protein: 5.7 g/dL — ABNORMAL LOW (ref 6.5–8.1)

## 2019-10-21 LAB — URINALYSIS, ROUTINE W REFLEX MICROSCOPIC
Bilirubin Urine: NEGATIVE
Glucose, UA: NEGATIVE mg/dL
Hgb urine dipstick: NEGATIVE
Ketones, ur: NEGATIVE mg/dL
Nitrite: NEGATIVE
Protein, ur: >=300 mg/dL — AB
Specific Gravity, Urine: 1.015 (ref 1.005–1.030)
pH: 7 (ref 5.0–8.0)

## 2019-10-21 LAB — PROTEIN / CREATININE RATIO, URINE
Creatinine, Urine: 142.76 mg/dL
Protein Creatinine Ratio: 2.85 mg/mg{Cre} — ABNORMAL HIGH (ref 0.00–0.15)
Total Protein, Urine: 407 mg/dL

## 2019-10-21 LAB — TYPE AND SCREEN
ABO/RH(D): AB POS
Antibody Screen: NEGATIVE

## 2019-10-21 LAB — CBC WITH DIFFERENTIAL/PLATELET
Abs Immature Granulocytes: 0.02 10*3/uL (ref 0.00–0.07)
Basophils Absolute: 0 10*3/uL (ref 0.0–0.1)
Basophils Relative: 0 %
Eosinophils Absolute: 0.2 10*3/uL (ref 0.0–0.5)
Eosinophils Relative: 2 %
HCT: 32.4 % — ABNORMAL LOW (ref 36.0–46.0)
Hemoglobin: 10.6 g/dL — ABNORMAL LOW (ref 12.0–15.0)
Immature Granulocytes: 0 %
Lymphocytes Relative: 29 %
Lymphs Abs: 2.6 10*3/uL (ref 0.7–4.0)
MCH: 28 pg (ref 26.0–34.0)
MCHC: 32.7 g/dL (ref 30.0–36.0)
MCV: 85.7 fL (ref 80.0–100.0)
Monocytes Absolute: 0.6 10*3/uL (ref 0.1–1.0)
Monocytes Relative: 7 %
Neutro Abs: 5.7 10*3/uL (ref 1.7–7.7)
Neutrophils Relative %: 62 %
Platelets: 233 10*3/uL (ref 150–400)
RBC: 3.78 MIL/uL — ABNORMAL LOW (ref 3.87–5.11)
RDW: 13 % (ref 11.5–15.5)
WBC: 9.1 10*3/uL (ref 4.0–10.5)
nRBC: 0 % (ref 0.0–0.2)

## 2019-10-21 LAB — ABO/RH: ABO/RH(D): AB POS

## 2019-10-21 LAB — SARS CORONAVIRUS 2 BY RT PCR (HOSPITAL ORDER, PERFORMED IN ~~LOC~~ HOSPITAL LAB): SARS Coronavirus 2: NEGATIVE

## 2019-10-21 LAB — GLUCOSE, CAPILLARY: Glucose-Capillary: 204 mg/dL — ABNORMAL HIGH (ref 70–99)

## 2019-10-21 MED ORDER — CALCIUM CARBONATE ANTACID 500 MG PO CHEW
2.0000 | CHEWABLE_TABLET | ORAL | Status: DC | PRN
  Administered 2019-10-21 – 2019-10-30 (×13): 400 mg via ORAL
  Filled 2019-10-21 (×13): qty 2

## 2019-10-21 MED ORDER — DEXAMETHASONE SODIUM PHOSPHATE 10 MG/ML IJ SOLN
10.0000 mg | Freq: Once | INTRAMUSCULAR | Status: AC
  Administered 2019-10-21: 12:00:00 10 mg via INTRAVENOUS
  Filled 2019-10-21: qty 1

## 2019-10-21 MED ORDER — SODIUM CHLORIDE 0.9 % IV BOLUS (SEPSIS): 1000.0000 mL | Freq: Once | INTRAVENOUS | Status: DC

## 2019-10-21 MED ORDER — SODIUM CHLORIDE 0.9 % IV SOLN: Freq: Once | INTRAVENOUS | Status: DC

## 2019-10-21 MED ORDER — LABETALOL HCL 5 MG/ML IV SOLN
80.0000 mg | INTRAVENOUS | Status: DC | PRN
  Administered 2019-10-21 – 2019-10-22 (×2): 80 mg via INTRAVENOUS
  Filled 2019-10-21 (×2): qty 16

## 2019-10-21 MED ORDER — LACTATED RINGERS IV SOLN
INTRAVENOUS | Status: DC
  Administered 2019-10-21: 22:00:00 via INTRAVENOUS

## 2019-10-21 MED ORDER — PRENATAL MULTIVITAMIN CH
1.0000 | ORAL_TABLET | Freq: Every day | ORAL | Status: DC
  Administered 2019-10-21 – 2019-10-30 (×10): 1 via ORAL
  Filled 2019-10-21 (×10): qty 1

## 2019-10-21 MED ORDER — METOCLOPRAMIDE HCL 5 MG/ML IJ SOLN
10.0000 mg | Freq: Once | INTRAMUSCULAR | Status: AC
  Administered 2019-10-21: 12:00:00 10 mg via INTRAVENOUS
  Filled 2019-10-21: qty 2

## 2019-10-21 MED ORDER — LABETALOL HCL 200 MG PO TABS
200.0000 mg | ORAL_TABLET | Freq: Two times a day (BID) | ORAL | Status: DC
  Administered 2019-10-21 (×2): 200 mg via ORAL
  Filled 2019-10-21 (×2): qty 1

## 2019-10-21 MED ORDER — LACTATED RINGERS IV SOLN
INTRAVENOUS | Status: DC
  Administered 2019-10-21: 11:00:00 via INTRAVENOUS

## 2019-10-21 MED ORDER — ZOLPIDEM TARTRATE 5 MG PO TABS
5.0000 mg | ORAL_TABLET | Freq: Every evening | ORAL | Status: DC | PRN
  Administered 2019-10-21 – 2019-10-24 (×2): 5 mg via ORAL
  Filled 2019-10-21 (×2): qty 1

## 2019-10-21 MED ORDER — LABETALOL HCL 5 MG/ML IV SOLN
20.0000 mg | INTRAVENOUS | Status: DC | PRN
  Administered 2019-10-21 – 2019-10-28 (×5): 20 mg via INTRAVENOUS
  Filled 2019-10-21 (×6): qty 4

## 2019-10-21 MED ORDER — LORATADINE 10 MG PO TABS
10.0000 mg | ORAL_TABLET | Freq: Every day | ORAL | Status: DC
  Administered 2019-10-21 – 2019-10-30 (×10): 10 mg via ORAL
  Filled 2019-10-21 (×10): qty 1

## 2019-10-21 MED ORDER — DOCUSATE SODIUM 100 MG PO CAPS
100.0000 mg | ORAL_CAPSULE | Freq: Every day | ORAL | Status: DC
  Administered 2019-10-21 – 2019-10-25 (×4): 100 mg via ORAL
  Filled 2019-10-21 (×6): qty 1

## 2019-10-21 MED ORDER — LABETALOL HCL 5 MG/ML IV SOLN
40.0000 mg | INTRAVENOUS | Status: DC | PRN
  Administered 2019-10-21 – 2019-10-25 (×3): 40 mg via INTRAVENOUS
  Filled 2019-10-21 (×4): qty 8

## 2019-10-21 MED ORDER — DIPHENHYDRAMINE HCL 50 MG/ML IJ SOLN
25.0000 mg | Freq: Once | INTRAMUSCULAR | Status: AC
  Administered 2019-10-21: 25 mg via INTRAVENOUS
  Filled 2019-10-21: qty 1

## 2019-10-21 MED ORDER — HYDRALAZINE HCL 20 MG/ML IJ SOLN
10.0000 mg | INTRAMUSCULAR | Status: DC | PRN
  Administered 2019-10-21 – 2019-10-22 (×2): 10 mg via INTRAVENOUS
  Filled 2019-10-21 (×2): qty 1

## 2019-10-21 MED ORDER — ACETAMINOPHEN 325 MG PO TABS
650.0000 mg | ORAL_TABLET | ORAL | Status: DC | PRN
  Administered 2019-10-21 – 2019-10-31 (×10): 650 mg via ORAL
  Filled 2019-10-21 (×12): qty 2

## 2019-10-21 NOTE — Progress Notes (Signed)
   PRENATAL VISIT NOTE  Subjective:  Jessica Vasquez is a 28 y.o. [id number] at [redacted]w[redacted]d being seen today for ongoing prenatal care.  She is currently monitored for the following issues for this high-risk pregnancy and has Anemia; HSV (herpes simplex virus) infection; Supervision of other normal pregnancy, antepartum; Chronic nasal congestion; Nasal polyp; Alpha thalassemia silent carrier; Snoring; Gestational hypertension; GDM (gestational diabetes mellitus); and Preeclampsia on their problem list.  Patient reports headache since this am, has not taken anything. Seeing floaters, but "not that bad."  Contractions: Not present. Vag. Bleeding: None.  Movement: Present. Denies leaking of fluid.   The following portions of the patient's history were reviewed and updated as appropriate: allergies, current medications, past family history, past medical history, past social history, past surgical history and problem list.   Objective:   Vitals:   10/21/19 0846  BP: (!) 146/102  Pulse: 88  Weight: 263 lb (119.3 kg)    Fetal Status: Fetal Heart Rate (bpm): 139   Movement: Present     General:  Alert, oriented and cooperative. Patient is in no acute distress.  Skin: Skin is warm and dry. No rash noted.   Cardiovascular: Normal heart rate noted  Respiratory: Normal respiratory effort, no problems with respiration noted  Abdomen: Soft, gravid, appropriate for gestational age.  Pain/Pressure: Absent     Pelvic: Cervical exam deferred        Extremities: Normal range of motion.  Edema: None  Mental Status: Normal mood and affect. Normal behavior. Normal judgment and thought content.   Assessment and Plan:  Pregnancy: [id number] at [redacted]w[redacted]d  1. Supervision of other normal pregnancy, antepartum  2. HSV (herpes simplex virus) infection ppx 34-36 weeks  3. Diet controlled gestational diabetes mellitus (GDM) in third trimester Does not have log FG: 74, does not know other numbers PP: 112, does not know other  numbers  4. Pre-eclampsia in third trimester PEC based on BP and proteinuria New headache today with floaters To MAU for eval  5. Gestational hypertension, third trimester   Preterm labor symptoms and general obstetric precautions including but not limited to vaginal bleeding, contractions, leaking of fluid and fetal movement were reviewed in detail with the patient. Please refer to After Visit Summary for other counseling recommendations.   Return in about 1 week (around 10/28/2019) for high OB, in person.  Future Appointments  Date Time Provider [place]  10/22/2019  2:40 PM [place] NURSE [place] [facility]  10/22/2019  2:45 PM [place] 2 [facility]  10/28/2019  2:00 PM Anyanwu, Sallyanne Havers, MD [place] None  11/02/2019  3:30 PM [place] NURSE [place] [facility]  11/02/2019  3:30 PM [place] 1 [facility]    Sloan Leiter, MD

## 2019-10-21 NOTE — Progress Notes (Signed)
   10/21/19 1010 10/21/19 1026  Vital Signs  BP (!) 161/115 (!) 159/123  BP Location Right Arm  --   Patient Position (if appropriate) Sitting  --   BP Method Automatic  --   Pulse Rate 86 94  Pulse Rate Source Monitor  --   Resp 19  --   Temp 97.8 F (36.6 C)  --   Temp Source Oral  --     Provider notified of severe range blood pressures

## 2019-10-21 NOTE — H&P (Addendum)
Melony Overly  Physician Assistant Certified  Obstetrics     MAU Provider Note  Attested Addendum     Date of Service:  10/21/2019 10:41 AM                 Attestation signed by Woodroe Mode, MD at 10/21/2019 2:35 PM     Attestation of Attending Supervision of Advanced Practitioner (CNM/NP/PA): Evaluation and management procedures were performed by the Advanced Practitioner under my supervision and collaboration.  I have reviewed the Advanced Practitioner's note and chart, and I agree with the management and plan.     [name] MD           Expand All Collapse All            Expand widget buttonCollapse widget button    Show:Clear all   ManualTemplateCopied  Added by:     Arville Care, PA-C   Hover for detailscustomization button                                                                                                                                                                                      Chief Complaint:  Hypertension. Pre-eclampsia with headache and vision changes.       First Provider Initiated Contact with Patient 10/21/19 1028          HPI: Janiel Derhammer is a 28 y.o. [id number] at 76w1dby LMP who presents to maternity admissions reporting a new onset headache and visual changes described as floaters that began this morning.  The patient had a routine prenatal appointment this morning with KVivien RotaMD at which she mentioned her symptoms. BP was elevated at 146/102. The patient was advised to come to the MAU for further evaluation.  Upon arrival she continued to complain of a headache and some visual changes. She reports good fetal movement, denies LOF, vaginal bleeding,  vaginal itching/burning, urinary symptoms, dizziness, n/v, or fever/chills.       Hypertension  This is a recurrent problem. The current episode started 1 to 4 weeks ago. The problem has been gradually worsening since onset. The problem is uncontrolled. Associated symptoms include headaches. Pertinent negatives include no chest pain or shortness of breath. (Floaters in visual fields) There are no associated agents to hypertension. Past treatments include beta blockers.         Past Medical History:       Past Medical History:    Diagnosis   Date    .   Anemia        .  Asthma        .   Cholelithiasis        .   Headache        .   Herpes            last outbreak two weeks ago ( mid march 2015)    .   Hydradenitis        .   Sleep apnea        .   Trichimoniasis   01/14/2019          Past obstetric history:                   OB History    Gravida   Para   Term   Preterm   AB   Living    _0 0   1   2    SAB   TAB   Ectopic   Multiple   Live Births    1   0   0   0   2         #   Outcome   Date   GA   Lbr Len/2nd   Weight   Sex   Delivery   Anes   PTL   Lv    4   Current                                        3   Term   04/27/14   [redacted]w[redacted]d  10:10 / 00:05   3544 g   M   Vag-Spont   EPI       LIV    2   Term   04/09/12   467w1d 09:50 / 00:58   4020 g   F   Vag-Spont   EPI       LIV    1   SAB                                              Past Surgical History:        Past Surgical History:    Procedure   Laterality   Date    .   CHOLECYSTECTOMY   N/A   10/15/2015        Procedure: LAPAROSCOPIC CHOLECYSTECTOMY WITH INTRAOPERATIVE CHOLANGIOGRAM;  Surgeon: MaDonnie MesaMD;  [place] [facility] [place] [facility] [facility];  Laterality: N/A;     .   ERCP   N/A   10/16/2015        Procedure: ENDOSCOPIC RETROGRADE CHOLANGIOPANCREATOGRAPHY (ERCP);  Surgeon: CaGatha MayerMD;  [facility];  Service: Endoscopy;  Laterality: N/A;    .   nasal sugery                  Family History:        Family History    Problem   Relation   Age of Onset    .   Gallstones   Mother        .   Arthritis   Mother        .   ADD / ADHD   Brother        .  Diabetes   Maternal Grandmother        .   Heart disease   Maternal Grandmother        .   Hypertension   Maternal Grandmother        .   Stroke   Maternal Grandmother              Social History:   Social History             Tobacco Use    .   Smoking status:   Former Smoker            Quit date:   05/16/2011            Years since quitting:   8.4    .   Smokeless tobacco:   Never Used    Substance Use Topics    .   Alcohol use:   No    .   Drug use:   No          Allergies: No Known Allergies     Meds:           Medications Prior to Admission    Medication   Sig   Dispense   Refill   Last Dose    .   Butalbital-APAP-Caffeine 50-325-40 MG capsule   Take 1-2 capsules by mouth every 6 (six) hours as needed for headache.   30 capsule   3   10/20/2019 at Unknown time    .   ferrous sulfate 325 (65 FE) MG EC tablet   Take 1 tablet (325 mg total) by mouth 3 (three) times daily with meals.   90 tablet   3   10/20/2019 at Unknown time    .   pantoprazole (PROTONIX) 40 MG tablet   Take 1 tablet (40 mg total) by mouth daily.   30 tablet   1   10/20/2019 at Unknown time    .   Prenatal Vit-Fe Fumarate-FA (PREPLUS) 27-1 MG TABS   Take 1 tablet by mouth daily.   30 tablet   13   10/20/2019 at Unknown time    .   valACYclovir (VALTREX) 500 MG tablet   Take 1 tablet (500 mg total)  by mouth 2 (two) times daily.   60 tablet   1   10/20/2019 at Unknown time    .   Accu-Chek FastClix Lancets MISC   1 Device by Percutaneous route 4 (four) times daily.   100 each   12        .   Blood Pressure Monitoring KIT   1 kit by Does not apply route once a week.   1 kit   0        .   cetirizine-pseudoephedrine (ZYRTEC-D) 5-120 MG tablet   Take 1 tablet by mouth as needed for allergies.                Marland Kitchen   glucose blood (ACCU-CHEK GUIDE) test strip   Use as instructed   100 each   12              ROS:   Review of Systems   Constitutional: Negative.    Eyes: Positive for visual disturbance ("Floaters").   Respiratory: Negative for shortness of breath.    Cardiovascular: Negative for chest pain and leg swelling.   Gastrointestinal: Negative for abdominal pain and vomiting.   Genitourinary: Negative for vaginal bleeding and vaginal discharge.  Neurological: Positive for headaches. Negative for dizziness and seizures.            I have reviewed patient's Past Medical Hx, Surgical Hx, Family Hx, Social Hx, medications and allergies.       Physical Exam       Patient Vitals for the past 24 hrs:       BP   Temp   Temp src   Pulse   Resp   SpO2   Height   Weight    10/21/19 1203   (!) 150/91   -   -   86   -   -   -   -    10/21/19 1140   -   -   -   -   -   96 %   -   -    10/21/19 1135   -   -   -   -   -   99 %   -   -    10/21/19 1130   -   -   -   -   -   99 %   -   -    10/21/19 1125   (!) 164/106   -   -   71   -   98 %   -   -    10/21/19 1124   -   -   -   -   -   98 %   -   -    10/21/19 1120   -   -   -   -   -   99 %   -   -    10/21/19 1115   -   -   -   -   -   99 %   -   -    10/21/19 1112   (!) 152/107   -   -   79   -    -   -   -    10/21/19 1110   -   -   -   -   -   99 %   -   -    10/21/19 1105   -   -   -   -   -   100 %   -   -    10/21/19 1100   -   -   -   -   -   100 %   -   -    10/21/19 1055   -   -   -   -   -   100 %   -   -    10/21/19 1054   (!) 160/109   -   -   80   -   -   -   -    10/21/19 1050   -   -   -   -   -   99 %   -   -    10/21/19 1045   -   -   -   -   -   100 %   -   -    10/21/19 1044   (!) 160/103   -   -   82   -   -   -   -  10/21/19 1040   -   -   -   -   -   100 %   -   -    10/21/19 1035   -   -   -   -   -   100 %   -   -    10/21/19 1030   -   -   -   -   -   100 %   -   -    10/21/19 1026   (!) 159/123   -   -   94   -   -   -   -    10/21/19 1025   -   -   -   -   -   100 %   -   -    10/21/19 1010   (!) 161/115   97.8 F (36.6 C)   Oral   86   19   100 %   _0  (1.778 m)   120.1 kg       Constitutional: Well-developed, well-nourished female in no acute distress.   Cardiovascular: normal rate  Respiratory: normal effort  GI: Abd soft, non-tender, gravid appropriate for gestational age.   MS: Extremities nontender, no edema, normal ROM  Neurologic: Alert and oriented x 4.   GU: Neg CVAT.       FHT:  Baseline 140 , moderate variability, accelerations present, no decelerations        Labs:  Lab Results Last 24 Hours  AB/Positive/-- (07/08 1333)     Imaging:     Imaging Results                                                 MAU Course/MDM:      Orders Placed This Encounter    Procedures    .   CBC with Differential/Platelet    .   Comprehensive metabolic panel    .   Protein / creatinine ratio, urine    .   Urinalysis, Routine w reflex microscopic    .   Notify Physician    .   Measure blood pressure            Meds ordered this encounter    Medications    .   AND Linked Order Group        .   labetalol (NORMODYNE) injection 20 mg        .   labetalol (NORMODYNE) injection 40 mg        .   labetalol (NORMODYNE) injection 80 mg        .   hydrALAZINE (APRESOLINE) injection 10 mg    .   DISCONTD: 0.9 %  sodium chloride infusion    .   lactated ringers infusion    .   AND Linked Order Group        .   sodium  chloride 0.9 % bolus 1,000 mL        .   dexamethasone (DECADRON) injection 10 mg        .   metoCLOPramide (REGLAN) injection 10 mg        .   diphenhydrAMINE (BENADRYL) injection 25 mg             Consult [name] MD with presentation, exam findings and test results.   Treatments in MAU included LR infusion, labetalol injections, and hydralazine injection.    Pt admitted to [facility] specialty care for further pre-eclampsia treatment.           Assessment:          Problem List Items Addressed This Visit                   Cardiovascular and Mediastinum        Preeclampsia - Primary        Relevant Medications        labetalol (NORMODYNE) injection 20 mg        labetalol (NORMODYNE) injection 40 mg        labetalol (NORMODYNE) injection 80 mg        hydrALAZINE (APRESOLINE) injection 10 mg             Endocrine        GDM (gestational diabetes mellitus)             Other        Supervision of other normal pregnancy, antepartum                Plan:  Admit to [facility] specialty care for further management of pre-eclampsia             Dorothyann Peng  10/21/2019  12:12     I confirm that I have verified the information documented in the physician assistant student's note and that I have  also personally reperformed the history, physical exam and all medical decision making activities of this service and have verified that all service and findings are accurately documented in this student's note.      BP (!) 150/91   Pulse 86   Temp 97.8 F (36.6 C) (Oral)   Resp 19   Ht _0  (1.778 m)   Wt 120.1 kg   LMP 03/17/2019   SpO2 96%   BMI 37.99 kg/m      Physical Exam   Nursing note and vitals reviewed.  Constitutional: She is oriented to person, place, and time. She appears well-developed and well-nourished. No distress.   HENT:   Head: Normocephalic and atraumatic.    Cardiovascular: Normal rate and intact distal pulses.   Respiratory: Effort normal.   GI: Soft. She exhibits no distension and no mass. There is no abdominal tenderness. There is no rebound and no guarding.   Musculoskeletal:         General: No edema.  Neurological: She is alert and oriented to person, place, and time. She has normal reflexes.  No clonus   Skin: Skin is warm and dry. No erythema.  Psychiatric: She has a normal mood and affect.      Patient states HA rated at 7/10. She has not taken anything for pain as home medications have failed in the past. She denies RUQ pain, edema. She has floaters and difficulty focusing her vision. +FM, denies vaginal bleeding, LOF, contractions.   Discussed patient with Dr. Roselie Awkward. This is her 5th visit since 10/14/19 when she was diagnosed with pre-eclampsia. Admit for blood pressure management.      Luvenia Redden, PA-C  10/21/2019 12:18 PM

## 2019-10-21 NOTE — MAU Provider Note (Addendum)
Chief Complaint:  Hypertension. Pre-eclampsia with headache and vision changes.    First Provider Initiated Contact with Patient 10/21/19 1028      HPI: Jessica Vasquez is a 28 y.o. [id number] at 49w1dby LMP who presents to maternity admissions reporting a new onset headache and visual changes described as floaters that began this morning.  The patient had a routine prenatal appointment this morning with KVivien RotaMD at which she mentioned her symptoms. BP was elevated at 146/102. The patient was advised to come to the MAU for further evaluation.  Upon arrival she continued to complain of a headache and some visual changes. She reports good fetal movement, denies LOF, vaginal bleeding, vaginal itching/burning, urinary symptoms, dizziness, n/v, or fever/chills.    Hypertension This is a recurrent problem. The current episode started 1 to 4 weeks ago. The problem has been gradually worsening since onset. The problem is uncontrolled. Associated symptoms include headaches. Pertinent negatives include no chest pain or shortness of breath. (Floaters in visual fields) There are no associated agents to hypertension. Past treatments include beta blockers.    Past Medical History: Past Medical History:  Diagnosis Date  . Anemia   . Asthma   . Cholelithiasis   . Headache   . Herpes    last outbreak two weeks ago ( mid march 2015)  . Hydradenitis   . Sleep apnea   . Trichimoniasis 01/14/2019    Past obstetric history: OB History  Gravida Para Term Preterm AB Living  _0 0 1 2  SAB TAB Ectopic Multiple Live Births  1 0 0 0 2    # Outcome Date GA Lbr Len/2nd Weight Sex Delivery Anes PTL Lv  4 Current           3 Term 04/27/14 330w6d0:10 / 00:05 3544 g M Vag-Spont EPI  LIV  2 Term 04/09/12 4138w1d:50 / 00:58 4020 g F Vag-Spont EPI  LIV  1 SAB             Past Surgical History: Past Surgical History:  Procedure Laterality Date  . CHOLECYSTECTOMY N/A 10/15/2015   Procedure: LAPAROSCOPIC  CHOLECYSTECTOMY WITH INTRAOPERATIVE CHOLANGIOGRAM;  Surgeon: MatDonnie MesaD;  [facility];  Laterality: N/A;  . ERCP N/A 10/16/2015   Procedure: ENDOSCOPIC RETROGRADE CHOLANGIOPANCREATOGRAPHY (ERCP);  Surgeon: CarGatha MayerD;  [facility];  Service: Endoscopy;  Laterality: N/A;  . nasal sugery      Family History: Family History  Problem Relation Age of Onset  . Gallstones Mother   . Arthritis Mother   . ADD / ADHD Brother   . Diabetes Maternal Grandmother   . Heart disease Maternal Grandmother   . Hypertension Maternal Grandmother   . Stroke Maternal Grandmother     Social History: Social History   Tobacco Use  . Smoking status: Former Smoker    Quit date: 05/16/2011    Years since quitting: 8.4  . Smokeless tobacco: Never Used  Substance Use Topics  . Alcohol use: No  . Drug use: No    Allergies: No Known Allergies  Meds:  Medications Prior to Admission  Medication Sig Dispense Refill Last Dose  . Butalbital-APAP-Caffeine 50-325-40 MG capsule Take 1-2 capsules by mouth every 6 (six) hours as needed for headache. 30 capsule 3 10/20/2019 at Unknown time  . ferrous sulfate 325 (65 FE) MG EC tablet Take 1 tablet (325 mg total) by mouth 3 (three) times daily with meals. 90 tablet  3 10/20/2019 at Unknown time  . pantoprazole (PROTONIX) 40 MG tablet Take 1 tablet (40 mg total) by mouth daily. 30 tablet 1 10/20/2019 at Unknown time  . Prenatal Vit-Fe Fumarate-FA (PREPLUS) 27-1 MG TABS Take 1 tablet by mouth daily. 30 tablet 13 10/20/2019 at Unknown time  . valACYclovir (VALTREX) 500 MG tablet Take 1 tablet (500 mg total) by mouth 2 (two) times daily. 60 tablet 1 10/20/2019 at Unknown time  . Accu-Chek FastClix Lancets MISC 1 Device by Percutaneous route 4 (four) times daily. 100 each 12   . Blood Pressure Monitoring KIT 1 kit by Does not apply route once a week. 1 kit 0   . cetirizine-pseudoephedrine (ZYRTEC-D) 5-120 MG tablet Take 1 tablet by  mouth as needed for allergies.     Marland Kitchen glucose blood (ACCU-CHEK GUIDE) test strip Use as instructed 100 each 12     ROS:  Review of Systems  Constitutional: Negative.   Eyes: Positive for visual disturbance ("Floaters").  Respiratory: Negative for shortness of breath.   Cardiovascular: Negative for chest pain and leg swelling.  Gastrointestinal: Negative for abdominal pain and vomiting.  Genitourinary: Negative for vaginal bleeding and vaginal discharge.  Neurological: Positive for headaches. Negative for dizziness and seizures.     I have reviewed patient's Past Medical Hx, Surgical Hx, Family Hx, Social Hx, medications and allergies.   Physical Exam   Patient Vitals for the past 24 hrs:  BP Temp Temp src Pulse Resp SpO2 Height Weight  10/21/19 1203 (!) 150/91 - - 86 - - - -  10/21/19 1140 - - - - - 96 % - -  10/21/19 1135 - - - - - 99 % - -  10/21/19 1130 - - - - - 99 % - -  10/21/19 1125 (!) 164/106 - - 71 - 98 % - -  10/21/19 1124 - - - - - 98 % - -  10/21/19 1120 - - - - - 99 % - -  10/21/19 1115 - - - - - 99 % - -  10/21/19 1112 (!) 152/107 - - 79 - - - -  10/21/19 1110 - - - - - 99 % - -  10/21/19 1105 - - - - - 100 % - -  10/21/19 1100 - - - - - 100 % - -  10/21/19 1055 - - - - - 100 % - -  10/21/19 1054 (!) 160/109 - - 80 - - - -  10/21/19 1050 - - - - - 99 % - -  10/21/19 1045 - - - - - 100 % - -  10/21/19 1044 (!) 160/103 - - 82 - - - -  10/21/19 1040 - - - - - 100 % - -  10/21/19 1035 - - - - - 100 % - -  10/21/19 1030 - - - - - 100 % - -  10/21/19 1026 (!) 159/123 - - 94 - - - -  10/21/19 1025 - - - - - 100 % - -  10/21/19 1010 (!) 161/115 97.8 F (36.6 C) Oral 86 19 100 % _0  (1.778 m) 120.1 kg   Constitutional: Well-developed, well-nourished female in no acute distress.  Cardiovascular: normal rate Respiratory: normal effort GI: Abd soft, non-tender, gravid appropriate for gestational age.  MS: Extremities nontender, no edema, normal ROM Neurologic:  Alert and oriented x 4.  GU: Neg CVAT.    FHT:  Baseline 140 , moderate variability, accelerations present, no decelerations  Labs: Results for orders placed or performed during the hospital encounter of 10/21/19 (from the past 24 hour(s))  CBC with Differential/Platelet     Status: Abnormal   Collection Time: 10/21/19  9:50 AM  Result Value Ref Range   WBC 9.1 4.0 - 10.5 K/uL   RBC 3.78 (L) 3.87 - 5.11 MIL/uL   Hemoglobin 10.6 (L) 12.0 - 15.0 g/dL   HCT 32.4 (L) 36.0 - 46.0 %   MCV 85.7 80.0 - 100.0 fL   MCH 28.0 26.0 - 34.0 pg   MCHC 32.7 30.0 - 36.0 g/dL   RDW 13.0 11.5 - 15.5 %   Platelets 233 150 - 400 K/uL   nRBC 0.0 0.0 - 0.2 %   Neutrophils Relative % 62 %   Neutro Abs 5.7 1.7 - 7.7 K/uL   Lymphocytes Relative 29 %   Lymphs Abs 2.6 0.7 - 4.0 K/uL   Monocytes Relative 7 %   Monocytes Absolute 0.6 0.1 - 1.0 K/uL   Eosinophils Relative 2 %   Eosinophils Absolute 0.2 0.0 - 0.5 K/uL   Basophils Relative 0 %   Basophils Absolute 0.0 0.0 - 0.1 K/uL   Immature Granulocytes 0 %   Abs Immature Granulocytes 0.02 0.00 - 0.07 K/uL  Comprehensive metabolic panel     Status: Abnormal   Collection Time: 10/21/19  9:50 AM  Result Value Ref Range   Sodium 137 135 - 145 mmol/L   Potassium 4.0 3.5 - 5.1 mmol/L   Chloride 106 98 - 111 mmol/L   CO2 20 (L) 22 - 32 mmol/L   Glucose, Bld 93 70 - 99 mg/dL   BUN 5 (L) 6 - 20 mg/dL   Creatinine, Ser 0.70 0.44 - 1.00 mg/dL   Calcium 8.6 (L) 8.9 - 10.3 mg/dL   Total Protein 5.7 (L) 6.5 - 8.1 g/dL   Albumin 2.4 (L) 3.5 - 5.0 g/dL   AST 29 15 - 41 U/L   ALT 21 0 - 44 U/L   Alkaline Phosphatase 92 38 - 126 U/L   Total Bilirubin 0.3 0.3 - 1.2 mg/dL   GFR calc non Af Amer >60 >60 mL/min   GFR calc Af Amer >60 >60 mL/min   Anion gap 11 5 - 15  Urinalysis, Routine w reflex microscopic     Status: Abnormal   Collection Time: 10/21/19 10:30 AM  Result Value Ref Range   Color, Urine YELLOW YELLOW   APPearance HAZY (A) CLEAR   Specific  Gravity, Urine 1.015 1.005 - 1.030   pH 7.0 5.0 - 8.0   Glucose, UA NEGATIVE NEGATIVE mg/dL   Hgb urine dipstick NEGATIVE NEGATIVE   Bilirubin Urine NEGATIVE NEGATIVE   Ketones, ur NEGATIVE NEGATIVE mg/dL   Protein, ur >=300 (A) NEGATIVE mg/dL   Nitrite NEGATIVE NEGATIVE   Leukocytes,Ua SMALL (A) NEGATIVE   RBC / HPF 0-5 0 - 5 RBC/hpf   WBC, UA 6-10 0 - 5 WBC/hpf   Bacteria, UA RARE (A) NONE SEEN   Squamous Epithelial / LPF 11-20 0 - 5   Mucus PRESENT    Amorphous Crystal PRESENT    AB/Positive/-- (07/08 1333)  Imaging:  [place] Mfm Fetal Bpp Wo Non Stress  Result Date: 10/17/2019 ----------------------------------------------------------------------  OBSTETRICS REPORT                         (Signed Final 10/17/2019 07:08 am) ---------------------------------------------------------------------- Patient Info  ID #:  [id number]                          D.O.B.:  11-Aug-1991 (28 yrs)  Name:        CAITLAN CHAUCA                   Visit Date: 10/16/2019 01:50 pm ---------------------------------------------------------------------- Performed By  Performed By:      Corky Crafts             Ref. Address:      [address]                                                               [place]  Attending:         Johnell Comings MD         Secondary Phy.:    MAU Nursing-                                                               MAU/Triage  Referred By:       Kathie Dike [place]-       [facility]                     [place] ---------------------------------------------------------------------- Orders   #  Description                           Code         Ordered By   1  [place] MFM FETAL BPP WO NON               76819.01     MELANIE [place]  ----------------------------------------------------------------------   #  Order #                     Accession #                  Episode #   1  [id number]                   [id number]                  [id number]  ---------------------------------------------------------------------- Indications   Decreased fetal movements, third trimester,     O36.8130   unspecified   Gestational diabetes in pregnancy, diet  O24.410   controlled   Gestational hypertension without significant    O13.3   proteinuria, third trimester   Genetic carrier (Alpha Thalassemia silent       Z14.8   carrier)   [redacted] weeks gestation of pregnancy                 Z3A.30   Anemia during pregnancy in third trimester      Z99.357   Obesity complicating pregnancy, second          O99.212   trimester (BMI 36)(Negative AFP) (Low Risk   NIPS)  ---------------------------------------------------------------------- Vital Signs                                                  Height:        5'9" ---------------------------------------------------------------------- Fetal Evaluation  Num Of Fetuses:          1  Fetal Heart Rate(bpm):   129  Cardiac Activity:        Observed  Presentation:            Breech  Placenta:                Anterior  Amniotic Fluid  AFI FV:      Within normal limits  AFI Sum(cm)     %Tile       Largest Pocket(cm)  13.39           41          6.03  RUQ(cm)       RLQ(cm)        LUQ(cm)        LLQ(cm)  6.03          1.94           3.03           2.39 ---------------------------------------------------------------------- Biophysical Evaluation  Amniotic F.V:   Within normal limits        F. Tone:         Observed  F. Movement:    Observed                    Score:           8/8  F. Breathing:   Observed ---------------------------------------------------------------------- OB History  Gravidity:     4         Term:  2          Prem:  0        SAB:   1  TOP:           0       Ectopic: 0         Living: 2 ---------------------------------------------------------------------- Gestational Age  LMP:            30w 3d       Date:  03/17/19                    EDD:  12/22/19  Best:           [place]on Sett[place] 3d    Det. By:  LMP  (03/17/19)            EDD:  12/22/19 ---------------------------------------------------------------------- Cervix Uterus Adnexa  Cervix  Not visualized (advanced GA >24wks) ---------------------------------------------------------------------- Comments  A biophysical profile performed today was 8 out of  8.  There was normal amniotic fluid noted on today's ultrasound  exam. ----------------------------------------------------------------------                    Johnell Comings, MD Electronically Signed Final Report   10/17/2019 07:08 am ----------------------------------------------------------------------  [place] Mfm Fetal Bpp Wo Non Stress  Result Date: 10/12/2019 ----------------------------------------------------------------------  OBSTETRICS REPORT                       (Signed Final 10/12/2019 12:54 pm) ---------------------------------------------------------------------- Patient Info  ID #:       [id number]                          D.O.B.:  Nov 09, 1991 (28 yrs)  Name:       Waymon Amato                   Visit Date: 10/11/2019 09:25 pm ---------------------------------------------------------------------- Performed By  Performed By:     Dorena Dew     Ref. Address:      [address]                    [place], [place]                                                              [address]                                                              [place]  Attending:        Sander Nephew      Secondary Phy.:    MAU Nursing-                    MD                                                              MAU/Triage  Referred By:      Ander Purpura-       [facility]                    [place] ---------------------------------------------------------------------- Orders   #  Description                          Code         Ordered By   1  [place] MFM FETAL BPP WO NON               [id number]     LISA  [place]  ----------------------------------------------------------------------   #  Order #                    Accession #                 Episode #   1  [id number]                  [id number]                  [id number]  ---------------------------------------------------------------------- Indications   Decreased fetal movements, third trimester,    O36.8130   unspecified   [redacted] weeks gestation of pregnancy                Z3A.29  ---------------------------------------------------------------------- Vital Signs                                                 Height:        5'9" ---------------------------------------------------------------------- Fetal Evaluation  Num Of Fetuses:          1  Fetal Heart Rate(bpm):   137  Cardiac Activity:        Observed  Presentation:            Cephalic  Amniotic Fluid  AFI FV:      Within normal limits  AFI Sum(cm)     %Tile       Largest Pocket(cm)  19.06           73          6.51  RUQ(cm)       RLQ(cm)       LUQ(cm)        LLQ(cm)  6.51          4.84          2.82           4.89  Comment:    Breathing noted intermittently, but not sustained. ---------------------------------------------------------------------- Biophysical Evaluation  Amniotic F.V:   Within normal limits       F. Tone:         Observed  F. Movement:    Observed                   Score:           6/8  F. Breathing:   Not Observed ---------------------------------------------------------------------- OB History  Gravidity:    4         Term:   2        Prem:   0        SAB:   1  TOP:          0       Ectopic:  0        Living: 2 ---------------------------------------------------------------------- Gestational Age  LMP:           29w 5d        Date:  03/17/19                 EDD:  12/22/19  Best:          29w 5d     Det. By:  LMP  (03/17/19)          EDD:   12/22/19  ---------------------------------------------------------------------- Impression  Biophysical profile 6/8  Being seend in MAU for decreased fetal movement ---------------------------------------------------------------------- Recommendations  Management per inpatient team ----------------------------------------------------------------------               Sander Nephew, MD Electronically Signed Final Report   10/12/2019 12:54 pm ----------------------------------------------------------------------  [place] Mfm Fetal Bpp Wo Non Stress  Result Date: 10/08/2019 ----------------------------------------------------------------------  OBSTETRICS REPORT                       (Signed Final 10/08/2019 04:59 pm) ---------------------------------------------------------------------- Patient Info  ID #:       [id number]                          D.O.B.:  01-25-1991 (28 yrs)  Name:       Waymon Amato                   Visit Date: 10/08/2019 03:47 pm ---------------------------------------------------------------------- Performed By  Performed By:     Corky Crafts             Ref. Address:     [place],                                                             [place] [address]  Attending:        Johnell Comings MD         [facility]  Referred By:      Tresea Mall CNM ---------------------------------------------------------------------- Orders   #  Description                          Code         Ordered By   1  [place] MFM FETAL BPP WO NON              94503.88     KELLY DAVIS      [place]  ----------------------------------------------------------------------   #  Order #  Accession #                 Episode #   1  [id number]                  [id number]                  [id number]   ---------------------------------------------------------------------- Indications   Gestational diabetes in pregnancy, diet        O24.410   controlled   Gestational hypertension without significant   O13.3   proteinuria, third trimester   Genetic carrier (Alpha Thalassemia silent      Z14.8   carrier)   Anemia during pregnancy in third trimester     E94.076   Obesity complicating pregnancy, second         O99.212   trimester (BMI 36)(Negative AFP) (Low Risk   NIPS)   [redacted] weeks gestation of pregnancy                Z3A.29  ---------------------------------------------------------------------- Vital Signs                                                 Height:        5'9" ---------------------------------------------------------------------- Fetal Evaluation  Num Of Fetuses:         1  Fetal Heart Rate(bpm):  127  Cardiac Activity:       Observed  Presentation:           Cephalic  Placenta:               Anterior  P. Cord Insertion:      Previously Visualized  Amniotic Fluid  AFI FV:      Within normal limits  AFI Sum(cm)     %Tile       Largest Pocket(cm)  12.58           34          4.56  RUQ(cm)       RLQ(cm)       LUQ(cm)        LLQ(cm)  4.56          3.32          3.57           1.13 ---------------------------------------------------------------------- Biophysical Evaluation  Amniotic F.V:   Within normal limits       F. Tone:        Observed  F. Movement:    Observed                   Score:          8/8  F. Breathing:   Observed ---------------------------------------------------------------------- OB History  Gravidity:    4         Term:   2        Prem:   0        SAB:   1  TOP:          0       Ectopic:  0        Living: 2 ---------------------------------------------------------------------- Gestational Age  LMP:           29w 2d        Date:  03/17/19  EDD:   12/22/19  Best:          29w 2d     Det. By:  LMP  (03/17/19)          EDD:   12/22/19  ---------------------------------------------------------------------- Cervix Uterus Adnexa  Cervix  Not visualized (advanced GA >24wks) ---------------------------------------------------------------------- Comments  The patient was seen for a biophysical profile due to recently  diagnosed A1 gestational diabetes.  The patient reports that  she will start performing daily fingersticks once she obtains  all of her testing supplies.  A biophysical profile performed today was 8 out of 8.  There was normal amniotic fluid noted on today's ultrasound  exam.  The implications and management of diabetes in pregnancy  was discussed in detail with the patient. She was advised that  our goals for her fingerstick values are fasting values of 90-95  or less and two-hour postprandials of 120 or less.  Should her  fingerstick values be above these values, she may have to be  started on insulin or metformin to help her achieve better  glycemic control. The patient was advised that getting her  fingerstick values as close to these goals as possible would  provide her with the most optimal obstetrical outcome.  A follow-up exam was scheduled in 1 week. ----------------------------------------------------------------------                   Johnell Comings, MD Electronically Signed Final Report   10/08/2019 04:59 pm ----------------------------------------------------------------------  [place] Mfm Ob Follow Up  Result Date: 09/29/2019 ----------------------------------------------------------------------  OBSTETRICS REPORT                       (Signed Final 09/29/2019 04:53 pm) ---------------------------------------------------------------------- Patient Info  ID #:       [id number]                          D.O.B.:  06/04/1991 (28 yrs)  Name:       Waymon Amato                   Visit Date: 09/29/2019 04:38 pm ---------------------------------------------------------------------- Performed By  Performed By:     Jeanene Erb BS,       Ref. Address:     [address]                    [place],                                                             [place] [address]  Attending:        Johnell Comings MD         [facility]  Referred By:      Tresea Mall CNM ---------------------------------------------------------------------- Orders   #  Description                          Code         Ordered By   1  [place] MFM OB FOLLOW UP                  44967.59     Sander Nephew  ----------------------------------------------------------------------   #  Order #                    Accession #                 Episode #   1  [id number]                  [id number]                  [id number]  ---------------------------------------------------------------------- Indications   Encounter for other antenatal screening        Z36.2   follow-up   Obesity complicating pregnancy, second         O99.212   trimester (BMI 36)(Negative AFP) (Low Risk   NIPS)   Genetic carrier (Alpha Thalassemia silent      Z14.8   carrier)   [redacted] weeks gestation of pregnancy                Z3A.28   Anemia during pregnancy in third trimester     O99.013  ---------------------------------------------------------------------- Vital Signs  Weight (lb): 266                               Height:        5'9"  BMI:         39.28 ---------------------------------------------------------------------- Fetal Evaluation  Num Of Fetuses:         1  Fetal Heart Rate(bpm):  146  Cardiac Activity:       Observed  Presentation:           Cephalic  Placenta:               Anterior  P. Cord Insertion:      Previously Visualized  Amniotic Fluid  AFI FV:      Within normal limits  AFI Sum(cm)     %Tile       Largest Pocket(cm)  23.68           96          7.87  RUQ(cm)        RLQ(cm)       LUQ(cm)        LLQ(cm)  7.87          5.44          5.97           4.4 ---------------------------------------------------------------------- Biometry  BPD:  68.6  mm     G. Age:  27w 4d         25  %    CI:        75.48   %    70 - 86                                                          FL/HC:      22.3   %    18.8 - 20.6  HC:      250.4  mm     G. Age:  27w 1d          6  %    HC/AC:      1.00        1.05 - 1.21  AC:      250.3  mm     G. Age:  29w 2d         79  %    FL/BPD:     81.3   %    71 - 87  FL:       55.8  mm     G. Age:  29w 3d         75  %    FL/AC:      22.3   %    20 - 24  Est. FW:    1311  gm    2 lb 14 oz      74  % ---------------------------------------------------------------------- OB History  Gravidity:    4         Term:   2        Prem:   0        SAB:   1  TOP:          0       Ectopic:  0        Living: 2 ---------------------------------------------------------------------- Gestational Age  LMP:           28w 0d        Date:  03/17/19                 EDD:   12/22/19  U/S Today:     28w 3d                                        EDD:   12/19/19  Best:          28w 0d     Det. By:  LMP  (03/17/19)          EDD:   12/22/19 ---------------------------------------------------------------------- Anatomy  Cranium:               Appears normal         Aortic Arch:            Appears normal  Cavum:                 Previously seen        Ductal Arch:            Previously seen  Ventricles:            Previously seen  Diaphragm:              Previously seen  Choroid Plexus:        Previously seen        Stomach:                Appears normal, left                                                                        sided  Cerebellum:            Previously seen        Abdomen:                Appears normal  Posterior Fossa:       Previously seen        Abdominal Wall:         Previously seen  Nuchal Fold:           Previously seen        Cord Vessels:            Previously seen  Face:                  Orbits and profile     Kidneys:                Previously seen                         previously seen  Lips:                  Previously seen        Bladder:                Appears normal  Thoracic:              Appears normal         Spine:                  Previously seen  Heart:                 Appears normal         Upper Extremities:      Previously seen                         (4CH, axis, and                         situs)  RVOT:                  Appears normal         Lower Extremities:      Previously seen  LVOT:                  Appears normal  Other:  Female gender Technically difficult due to maternal habitus and fetal          position. ---------------------------------------------------------------------- Cervix Uterus Adnexa  Cervix  Not visualized (advanced GA >24wks) ---------------------------------------------------------------------- Comments  This patient was seen for a follow up exam to obtain better  views of the fetal heart.  The  fetal growth and amniotic fluid level appears appropriate  for her gestational age.  The fetal cardiac views were visualized today.  There were no  obvious anomalies suspected.  Follow-up as indicated. ----------------------------------------------------------------------                   Johnell Comings, MD Electronically Signed Final Report   09/29/2019 04:53 pm ----------------------------------------------------------------------   MAU Course/MDM: Orders Placed This Encounter  Procedures  . CBC with Differential/Platelet  . Comprehensive metabolic panel  . Protein / creatinine ratio, urine  . Urinalysis, Routine w reflex microscopic  . Notify Physician  . Measure blood pressure    Meds ordered this encounter  Medications  . AND Linked Order Group   . labetalol (NORMODYNE) injection 20 mg   . labetalol (NORMODYNE) injection 40 mg   . labetalol (NORMODYNE) injection 80 mg   . hydrALAZINE (APRESOLINE) injection  10 mg  . DISCONTD: 0.9 %  sodium chloride infusion  . lactated ringers infusion  . AND Linked Order Group   . sodium chloride 0.9 % bolus 1,000 mL   . dexamethasone (DECADRON) injection 10 mg   . metoCLOPramide (REGLAN) injection 10 mg   . diphenhydrAMINE (BENADRYL) injection 25 mg      Consult [name] MD with presentation, exam findings and test results.  Treatments in MAU included LR infusion, labetalol injections, and hydralazine injection.   Pt admitted to [facility] specialty care for further pre-eclampsia treatment.    Assessment:  Problem List Items Addressed This Visit      Cardiovascular and Mediastinum   Preeclampsia - Primary   Relevant Medications   labetalol (NORMODYNE) injection 20 mg   labetalol (NORMODYNE) injection 40 mg   labetalol (NORMODYNE) injection 80 mg   hydrALAZINE (APRESOLINE) injection 10 mg     Endocrine   GDM (gestational diabetes mellitus)     Other   Supervision of other normal pregnancy, antepartum      Plan: Admit to [facility] specialty care for further management of pre-eclampsia     Dorothyann Peng 10/21/2019  12:12  I confirm that I have verified the information documented in the physician assistant student's note and that I have also personally reperformed the history, physical exam and all medical decision making activities of this service and have verified that all service and findings are accurately documented in this student's note.   BP (!) 150/91   Pulse 86   Temp 97.8 F (36.6 C) (Oral)   Resp 19   Ht _0  (1.778 m)   Wt 120.1 kg   LMP 03/17/2019   SpO2 96%   BMI 37.99 kg/m   Physical Exam  Nursing note and vitals reviewed. Constitutional: She is oriented to person, place, and time. She appears well-developed and well-nourished. No distress.  HENT:  Head: Normocephalic and atraumatic.  Cardiovascular: Normal rate and intact distal pulses.  Respiratory: Effort normal.  GI: Soft. She exhibits no distension and no  mass. There is no abdominal tenderness. There is no rebound and no guarding.  Musculoskeletal:        General: No edema.  Neurological: She is alert and oriented to person, place, and time. She has normal reflexes.  No clonus  Skin: Skin is warm and dry. No erythema.  Psychiatric: She has a normal mood and affect.   Patient states HA rated at 7/10. She has not taken anything for pain as home medications have failed in the past. She denies RUQ pain, edema. She has  floaters and difficulty focusing her vision. +FM, denies vaginal bleeding, LOF, contractions.  Discussed patient with Dr. Roselie Awkward. This is her 5th visit since 10/14/19 when she was diagnosed with pre-eclampsia. Admit for blood pressure management.   Luvenia Redden, PA-C 10/21/2019 12:18 PM

## 2019-10-21 NOTE — MAU Note (Signed)
Pt sent from MD office for Pre Ecalmpsia evaluation.  Reports current H/A & floaters. Hasn't taken meds to treat H/A.  Denies epigastric pain.  Endorses +FM.  Denies VB or LOF.

## 2019-10-22 ENCOUNTER — Ambulatory Visit (HOSPITAL_COMMUNITY): Payer: BC Managed Care – PPO

## 2019-10-22 ENCOUNTER — Inpatient Hospital Stay (HOSPITAL_COMMUNITY): Payer: BC Managed Care – PPO

## 2019-10-22 DIAGNOSIS — O133 Gestational [pregnancy-induced] hypertension without significant proteinuria, third trimester: Secondary | ICD-10-CM

## 2019-10-22 DIAGNOSIS — Z3A31 31 weeks gestation of pregnancy: Secondary | ICD-10-CM

## 2019-10-22 DIAGNOSIS — O289 Unspecified abnormal findings on antenatal screening of mother: Secondary | ICD-10-CM

## 2019-10-22 DIAGNOSIS — O99213 Obesity complicating pregnancy, third trimester: Secondary | ICD-10-CM

## 2019-10-22 DIAGNOSIS — O1493 Unspecified pre-eclampsia, third trimester: Secondary | ICD-10-CM

## 2019-10-22 DIAGNOSIS — O99013 Anemia complicating pregnancy, third trimester: Secondary | ICD-10-CM

## 2019-10-22 LAB — PROTEIN, URINE, 24 HOUR
Collection Interval-UPROT: 24 hours
Protein, 24H Urine: 8123 mg/d — ABNORMAL HIGH (ref 50–100)
Protein, Urine: 114 mg/dL
Urine Total Volume-UPROT: 7125 mL

## 2019-10-22 LAB — GLUCOSE, CAPILLARY
Glucose-Capillary: 115 mg/dL — ABNORMAL HIGH (ref 70–99)
Glucose-Capillary: 147 mg/dL — ABNORMAL HIGH (ref 70–99)
Glucose-Capillary: 94 mg/dL (ref 70–99)

## 2019-10-22 LAB — CULTURE, BETA STREP (GROUP B ONLY)

## 2019-10-22 MED ORDER — LABETALOL HCL 200 MG PO TABS
400.0000 mg | ORAL_TABLET | Freq: Two times a day (BID) | ORAL | Status: DC
  Administered 2019-10-22 – 2019-10-23 (×4): 400 mg via ORAL
  Filled 2019-10-22 (×4): qty 2

## 2019-10-22 MED ORDER — ONDANSETRON HCL 4 MG/2ML IJ SOLN
4.0000 mg | Freq: Four times a day (QID) | INTRAMUSCULAR | Status: DC | PRN
  Administered 2019-10-22 – 2019-10-30 (×5): 4 mg via INTRAVENOUS
  Filled 2019-10-22 (×5): qty 2

## 2019-10-22 NOTE — Progress Notes (Signed)
Patient ID: Jessica Vasquez, female   DOB: 1991-03-02, 28 y.o.   MRN: [id number] FACULTY PRACTICE ANTEPARTUM(COMPREHENSIVE) NOTE  Jessica Vasquez is a 28 y.o. [id number] with Estimated Date of Delivery: 12/22/19   By   [redacted]w[redacted]d  who is admitted for observation due to [facility] with superimposed pre ecalmpsia now with severe features.    Fetal presentation is cephalic. Length of Stay:  1  Days  Date of admission:10/21/2019  Subjective: Pt has sleep apnea did not sleep that well, does not use her sleep apnea machine regularly, headache and blurry vision resolved Patient reports the fetal movement as active. Patient reports uterine contraction  activity as none. Patient reports  vaginal bleeding as none. Patient describes fluid per vagina as None.  Vitals:  Blood pressure 137/84, pulse (!) 106, temperature 97.8 F (36.6 C), temperature source Oral, resp. rate 18, height 5\' 10"  (1.778 m), weight 120.1 kg, last menstrual period 03/17/2019, SpO2 100 %, currently breastfeeding. Vitals:   10/22/19 0355 10/22/19 0400 10/22/19 0405 10/22/19 0408  BP:    137/84  Pulse:    (!) 106  Resp:    18  Temp:    97.8 F (36.6 C)  TempSrc:    Oral  SpO2: 99% 99% 100% 100%  Weight:      Height:       Physical Examination:  General appearance - alert, well appearing, and in no distress Abdomen - soft, nontender, nondistended, no masses or organomegaly Fundal Height:  size equals dates Pelvic Exam:  examination not indicated Cervical Exam: Not evaluated.  Extremities: extremities normal, atraumatic, no cyanosis or edema with DTRs 2+ bilaterally Membranes:intact  Fetal Monitoring:  Baseline: 130s bpm, Variability: Good {> 6 bpm), Accelerations: Reactive and Decelerations: Absent   reactive  Labs:  Results for orders placed or performed during the hospital encounter of 10/21/19 (from the past 24 hour(s))  CBC with Differential/Platelet   Collection Time: 10/21/19  9:50 AM  Result Value Ref Range   WBC 9.1 4.0 - 10.5 K/uL    RBC 3.78 (L) 3.87 - 5.11 MIL/uL   Hemoglobin 10.6 (L) 12.0 - 15.0 g/dL   HCT 13/04/20 (L) 54.5 - 62.5 %   MCV 85.7 80.0 - 100.0 fL   MCH 28.0 26.0 - 34.0 pg   MCHC 32.7 30.0 - 36.0 g/dL   RDW 63.8 93.7 - 34.2 %   Platelets 233 150 - 400 K/uL   nRBC 0.0 0.0 - 0.2 %   Neutrophils Relative % 62 %   Neutro Abs 5.7 1.7 - 7.7 K/uL   Lymphocytes Relative 29 %   Lymphs Abs 2.6 0.7 - 4.0 K/uL   Monocytes Relative 7 %   Monocytes Absolute 0.6 0.1 - 1.0 K/uL   Eosinophils Relative 2 %   Eosinophils Absolute 0.2 0.0 - 0.5 K/uL   Basophils Relative 0 %   Basophils Absolute 0.0 0.0 - 0.1 K/uL   Immature Granulocytes 0 %   Abs Immature Granulocytes 0.02 0.00 - 0.07 K/uL  Comprehensive metabolic panel   Collection Time: 10/21/19  9:50 AM  Result Value Ref Range   Sodium 137 135 - 145 mmol/L   Potassium 4.0 3.5 - 5.1 mmol/L   Chloride 106 98 - 111 mmol/L   CO2 20 (L) 22 - 32 mmol/L   Glucose, Bld 93 70 - 99 mg/dL   BUN 5 (L) 6 - 20 mg/dL   Creatinine, Ser 13/04/20 0.44 - 1.00 mg/dL   Calcium 8.6 (L) 8.9 - 10.3 mg/dL  Total Protein 5.7 (L) 6.5 - 8.1 g/dL   Albumin 2.4 (L) 3.5 - 5.0 g/dL   AST 29 15 - 41 U/L   ALT 21 0 - 44 U/L   Alkaline Phosphatase 92 38 - 126 U/L   Total Bilirubin 0.3 0.3 - 1.2 mg/dL   GFR calc non Af Amer >60 >60 mL/min   GFR calc Af Amer >60 >60 mL/min   Anion gap 11 5 - 15  Protein / creatinine ratio, urine   Collection Time: 10/21/19 10:30 AM  Result Value Ref Range   Creatinine, Urine 142.76 mg/dL   Total Protein, Urine 407 mg/dL   Protein Creatinine Ratio 2.85 (H) 0.00 - 0.15 mg/mg[Cre]  Urinalysis, Routine w reflex microscopic   Collection Time: 10/21/19 10:30 AM  Result Value Ref Range   Color, Urine YELLOW YELLOW   APPearance HAZY (A) CLEAR   Specific Gravity, Urine 1.015 1.005 - 1.030   pH 7.0 5.0 - 8.0   Glucose, UA NEGATIVE NEGATIVE mg/dL   Hgb urine dipstick NEGATIVE NEGATIVE   Bilirubin Urine NEGATIVE NEGATIVE   Ketones, ur NEGATIVE NEGATIVE mg/dL    Protein, ur >=161>=300 (A) NEGATIVE mg/dL   Nitrite NEGATIVE NEGATIVE   Leukocytes,Ua SMALL (A) NEGATIVE   RBC / HPF 0-5 0 - 5 RBC/hpf   WBC, UA 6-10 0 - 5 WBC/hpf   Bacteria, UA RARE (A) NONE SEEN   Squamous Epithelial / LPF 11-20 0 - 5   Mucus PRESENT    Amorphous Crystal PRESENT   Type and screen [facility]   Collection Time: 10/21/19 10:37 AM  Result Value Ref Range   ABO/RH(D) AB POS    Antibody Screen NEG    Sample Expiration      10/24/2019,2359 Performed at [facility], [address] [place], [place] [address]   ABO/Rh   Collection Time: 10/21/19 10:37 AM  Result Value Ref Range   ABO/RH(D)      AB POS Performed at [facility], [address] [place], [place] [address]   SARS Coronavirus 2 by RT PCR (hospital order, performed in [facility] hospital lab) Nasopharyngeal Vaginal/Rectal   Collection Time: 10/21/19 12:43 PM   Specimen: Vaginal/Rectal; Nasopharyngeal  Result Value Ref Range   SARS Coronavirus 2 NEGATIVE NEGATIVE  Glucose, capillary   Collection Time: 10/21/19  8:36 PM  Result Value Ref Range   Glucose-Capillary 204 (H) 70 - 99 mg/dL  Glucose, capillary   Collection Time: 10/22/19  5:14 AM  Result Value Ref Range   Glucose-Capillary 115 (H) 70 - 99 mg/dL    Imaging Studies:     Currently EPIC will not allow sonographic studies to automatically populate into notes.  In the meantime, copy and paste results into note or free text.  Medications:  Scheduled . docusate sodium  100 mg Oral Daily  . labetalol  200 mg Oral BID  . loratadine  10 mg Oral Daily  . prenatal multivitamin  1 tablet Oral Q1200   I have reviewed the patient's current medications.  ASSESSMENT: [id number] Estimated Date of Delivery: 12/22/19  GHTN with superimposed pre eclampsia, severe features BPP 2/8 with reassuring FHR strip, 4/10    Patient Active Problem List   Diagnosis Date Noted  . Preeclampsia, third trimester 10/21/2019  .  Preeclampsia 10/14/2019  . GDM (gestational diabetes mellitus) 10/01/2019  . Gestational hypertension 09/30/2019  . Snoring 08/06/2019  . Alpha thalassemia silent carrier 08/05/2019  . Supervision of  other normal pregnancy, antepartum 06/09/2019  . Chronic nasal congestion 05/07/2019  . Nasal polyp 05/07/2019  . Anemia 10/20/2013  . HSV (herpes simplex virus) infection 10/20/2013    PLAN: >repeat BPP this am:  Patient with gestational hypertension is admitted with c/o  headache and visual disturbances.  On ultrasound, amniotic fluid is normal. Fetal growth is  appropriate for gestational age. Fetal movements, tone and  fetal breathing movements did not meet the criteria. BPP 2/8.  I reviewed the NST (reactive), which makes the BPP score  4/10.  Although BPP score of 4/10 carries a higher perinatal  mortality, it (BPP) has limitations (false positive) and can lead  to preterm delivery.  I recommend continuous monitoring (NST) and repeat BPP  tomorrow.  Delivery is indicated if a) maternal symptoms worsen, b)  nonreassuring fetal heart trace including decelerations are  seen or c) abnormal labs are seen.  I discussed with Dr. Elonda Husky.  >BP is stable this am >No headache or visual changes during the night  >as above delivery based on clinical status  >elevated CBG due to BMZ doses 10/28 and10/29, if continue consider lantus for coverage   Luther H Eure 10/22/2019,6:55 AM

## 2019-10-22 NOTE — Plan of Care (Signed)
Pt. To remain SL post shower and for FHR monitoring to be 30 min q shift. Pt. Has been updates as to POC and in agreement. She will call out with new ctx or changes in her condition, she knows she can be placed on the monitor at anytime she feels she needs to be and that we will be monitoring her every shift.

## 2019-10-23 DIAGNOSIS — B951 Streptococcus, group B, as the cause of diseases classified elsewhere: Secondary | ICD-10-CM | POA: Insufficient documentation

## 2019-10-23 HISTORY — DX: Streptococcus, group b, as the cause of diseases classified elsewhere: B95.1

## 2019-10-23 LAB — GLUCOSE, CAPILLARY
Glucose-Capillary: 90 mg/dL (ref 70–99)
Glucose-Capillary: 117 mg/dL — ABNORMAL HIGH (ref 70–99)
Glucose-Capillary: 110 mg/dL — ABNORMAL HIGH (ref 70–99)

## 2019-10-23 NOTE — Progress Notes (Signed)
Patient ID: Jessica Vasquez, female   DOB: [birth year], 28 y.o.   MRN: [id number] [place]) NOTE  Jessica Vasquez is a 28 y.o. [id number] at [redacted]w[redacted]d who is admitted for observation due to [facility] with superimposed pre ecalmpsia now with severe features.   Fetal presentation is cephalic. Length of Stay:  2  Days  Subjective: No complaints Patient reports the fetal movement as active. Patient reports uterine contraction  activity as none. Patient reports  vaginal bleeding as none. Patient describes fluid per vagina as None.  Vitals:  Blood pressure (!) 145/82, pulse 83, temperature 98.4 F (36.9 C), temperature source Oral, resp. rate 18, height 5\' 10"  (1.778 m), weight 120.1 kg, last menstrual period 03/17/2019, SpO2 98 %, currently breastfeeding. Physical Examination:  General appearance - alert, well appearing, and in no distress Heart - normal rate and regular rhythm Abdomen - soft, nontender, nondistended Fundal Height:  size equals dates Cervical Exam: Not evaluated Membranes:intact  Fetal Monitoring:  Baseline: 135 bpm, Variability: Good {> 6 bpm), Accelerations: Non-reactive but appropriate for gestational age and Decelerations: Absent Fetal Heart Rate A  Mode External filed at 10/22/2019 2346  Baseline Rate (A) 135 bpm filed at 10/22/2019 2346  Variability <5 BPM, 6-25 BPM filed at 10/22/2019 2346  Accelerations 10 x 10 filed at 10/22/2019 2346  Decelerations None filed at 10/22/2019 2346    Labs:  Results for orders placed or performed during the hospital encounter of 10/21/19 (from the past 24 hour(s))  Glucose, capillary   Collection Time: 10/22/19  2:37 PM  Result Value Ref Range   Glucose-Capillary 147 (H) 70 - 99 mg/dL  Glucose, capillary   Collection Time: 10/22/19  9:43 PM  Result Value Ref Range   Glucose-Capillary 94 70 - 99 mg/dL  Glucose, capillary   Collection Time: 10/23/19  6:19 AM  Result Value Ref Range   Glucose-Capillary 90 70 - 99  mg/dL     Medications:  Scheduled . docusate sodium  100 mg Oral Daily  . labetalol  400 mg Oral BID  . loratadine  10 mg Oral Daily  . prenatal multivitamin  1 tablet Oral Q1200   I have reviewed the patient's current medications.  ASSESSMENT: Patient Active Problem List   Diagnosis Date Noted  . Group beta Strep positive 10/23/2019  . Preeclampsia, third trimester 10/21/2019  . Preeclampsia 10/14/2019  . GDM (gestational diabetes mellitus) 10/01/2019  . Gestational hypertension 09/30/2019  . Snoring 08/06/2019  . Alpha thalassemia silent carrier 08/05/2019  . Supervision of other normal pregnancy, antepartum 06/09/2019  . Chronic nasal congestion 05/07/2019  . Nasal polyp 05/07/2019  . Anemia 10/20/2013  . HSV (herpes simplex virus) infection 10/20/2013    PLAN: BP not in severe range on current labetalol dose, continue observation   [name] 10/23/2019,12:46 PM

## 2019-10-24 DIAGNOSIS — O1413 Severe pre-eclampsia, third trimester: Secondary | ICD-10-CM

## 2019-10-24 LAB — GLUCOSE, CAPILLARY
Glucose-Capillary: 88 mg/dL (ref 70–99)
Glucose-Capillary: 95 mg/dL (ref 70–99)
Glucose-Capillary: 88 mg/dL (ref 70–99)

## 2019-10-24 MED ORDER — BUTALBITAL-APAP-CAFFEINE 50-325-40 MG PO TABS
2.0000 | ORAL_TABLET | Freq: Four times a day (QID) | ORAL | Status: DC | PRN
  Administered 2019-10-24 (×2): 2 via ORAL
  Filled 2019-10-24 (×2): qty 2

## 2019-10-24 MED ORDER — VALACYCLOVIR HCL 500 MG PO TABS
500.0000 mg | ORAL_TABLET | Freq: Two times a day (BID) | ORAL | Status: DC
  Administered 2019-10-24 – 2019-10-30 (×14): 500 mg via ORAL
  Filled 2019-10-24 (×14): qty 1

## 2019-10-24 MED ORDER — SODIUM CHLORIDE 0.9% FLUSH
3.0000 mL | Freq: Two times a day (BID) | INTRAVENOUS | Status: DC
  Administered 2019-10-24 – 2019-10-30 (×13): 3 mL via INTRAVENOUS

## 2019-10-24 MED ORDER — LABETALOL HCL 200 MG PO TABS
400.0000 mg | ORAL_TABLET | Freq: Three times a day (TID) | ORAL | Status: DC
  Administered 2019-10-24 (×3): 400 mg via ORAL
  Filled 2019-10-24 (×3): qty 2

## 2019-10-24 NOTE — Progress Notes (Addendum)
Pt. Called out saying she thought she might have contractions. Placed on monitor. Will evaluate.

## 2019-10-24 NOTE — Progress Notes (Addendum)
[place] ANTEPARTUM PROGRESS NOTE  Ahlam Piscitelli is a 28 y.o. [id number] at [redacted]w[redacted]d who is admitted for severe pre-eclampsia.  Estimated Date of Delivery: 12/22/19 Fetal presentation is cephalic.  Length of Stay:  3 Days. Admitted 10/21/2019  Subjective: Patient reports normal fetal movement.  She denies uterine contractions, denies bleeding and leaking of fluid per vagina. Headache slightly improved but not significantly better.  Vitals:  Blood pressure (!) 144/90, pulse 77, temperature 98.7 F (37.1 C), temperature source Oral, resp. rate 16, height 5\' 10"  (1.778 m), weight 120.1 kg, last menstrual period 03/17/2019, SpO2 99 %, currently breastfeeding. Physical Examination: CONSTITUTIONAL: Well-developed, well-nourished female in no acute distress.  HENT:  Normocephalic, atraumatic, External right and left ear normal. Oropharynx is clear and moist EYES: Conjunctivae and EOM are normal. Pupils are equal, round, and reactive to light. No scleral icterus.  NECK: Normal range of motion, supple, no masses. SKIN: Skin is warm and dry. No rash noted. Not diaphoretic. No erythema. No pallor. [place]: Alert and oriented to person, place, and time. Normal reflexes, muscle tone coordination. No cranial nerve deficit noted. PSYCHIATRIC: Normal mood and affect. Normal behavior. Normal judgment and thought content. CARDIOVASCULAR: Normal heart rate noted RESPIRATORY: Effort normal, no problems with respiration noted MUSCULOSKELETAL: Normal range of motion. No edema and no tenderness. ABDOMEN: Soft, nontender, nondistended, gravid. CERVIX: deferred  Fetal monitoring: FHR: 140 bpm, Variability: moderate, Accelerations: Present, Decelerations: Absent  Uterine activity: no contractions per hour  Results for orders placed or performed during the hospital encounter of 10/21/19 (from the past 48 hour(s))  Glucose, capillary     Status: Abnormal   Collection Time: 10/22/19  2:37 PM  Result Value Ref  Range   Glucose-Capillary 147 (H) 70 - 99 mg/dL  Glucose, capillary     Status: None   Collection Time: 10/22/19  9:43 PM  Result Value Ref Range   Glucose-Capillary 94 70 - 99 mg/dL  Glucose, capillary     Status: None   Collection Time: 10/23/19  6:19 AM  Result Value Ref Range   Glucose-Capillary 90 70 - 99 mg/dL  Glucose, capillary     Status: Abnormal   Collection Time: 10/23/19 12:57 PM  Result Value Ref Range   Glucose-Capillary 117 (H) 70 - 99 mg/dL  Glucose, capillary     Status: Abnormal   Collection Time: 10/23/19  8:29 PM  Result Value Ref Range   Glucose-Capillary 110 (H) 70 - 99 mg/dL  Glucose, capillary     Status: None   Collection Time: 10/24/19  6:38 AM  Result Value Ref Range   Glucose-Capillary 88 70 - 99 mg/dL   I have reviewed the patient's current medications.  ASSESSMENT: Active Problems:   HSV (herpes simplex virus) infection   Alpha thalassemia silent carrier   GDM (gestational diabetes mellitus)   Severe pre-eclampsia   Group beta Strep positive   PLAN: BPP 6/8 on 11/5, plan for repeat 11/9 Cont inpatient management until delivery Increase labetalol 400 BID to 400 TID today Cont CBG monitoring Start valtrex for HSV ppx Will need PCN for delivery for GBS+ S/p BTMZ 10/28-29   Continue routine antenatal care.   Feliz Beam, M.D. Attending [facility] [employer] (Faculty Practice)  10/24/2019 10:47 AM

## 2019-10-25 LAB — CBC
HCT: 31.5 % — ABNORMAL LOW (ref 36.0–46.0)
Hemoglobin: 10.3 g/dL — ABNORMAL LOW (ref 12.0–15.0)
MCH: 28.2 pg (ref 26.0–34.0)
MCHC: 32.7 g/dL (ref 30.0–36.0)
MCV: 86.3 fL (ref 80.0–100.0)
Platelets: 189 10*3/uL (ref 150–400)
RBC: 3.65 MIL/uL — ABNORMAL LOW (ref 3.87–5.11)
RDW: 13.3 % (ref 11.5–15.5)
WBC: 9.4 10*3/uL (ref 4.0–10.5)
nRBC: 0 % (ref 0.0–0.2)

## 2019-10-25 LAB — TYPE AND SCREEN
ABO/RH(D): AB POS
Antibody Screen: NEGATIVE

## 2019-10-25 LAB — COMPREHENSIVE METABOLIC PANEL
ALT: 16 U/L (ref 0–44)
AST: 26 U/L (ref 15–41)
Albumin: 2.2 g/dL — ABNORMAL LOW (ref 3.5–5.0)
Alkaline Phosphatase: 85 U/L (ref 38–126)
Anion gap: 10 (ref 5–15)
BUN: 5 mg/dL — ABNORMAL LOW (ref 6–20)
CO2: 21 mmol/L — ABNORMAL LOW (ref 22–32)
Calcium: 8.8 mg/dL — ABNORMAL LOW (ref 8.9–10.3)
Chloride: 103 mmol/L (ref 98–111)
Creatinine, Ser: 0.72 mg/dL (ref 0.44–1.00)
GFR calc Af Amer: >60 mL/min (ref >60)
GFR calc non Af Amer: >60 mL/min (ref >60)
Glucose, Bld: 84 mg/dL (ref 70–99)
Potassium: 4.2 mmol/L (ref 3.5–5.1)
Sodium: 134 mmol/L — ABNORMAL LOW (ref 135–145)
Total Bilirubin: 0.3 mg/dL (ref 0.3–1.2)
Total Protein: 5.2 g/dL — ABNORMAL LOW (ref 6.5–8.1)

## 2019-10-25 LAB — GLUCOSE, CAPILLARY
Glucose-Capillary: 76 mg/dL (ref 70–99)
Glucose-Capillary: 82 mg/dL (ref 70–99)
Glucose-Capillary: 116 mg/dL — ABNORMAL HIGH (ref 70–99)

## 2019-10-25 MED ORDER — LABETALOL HCL 200 MG PO TABS
400.0000 mg | ORAL_TABLET | Freq: Three times a day (TID) | ORAL | Status: DC
  Administered 2019-10-25 – 2019-10-26 (×4): 400 mg via ORAL
  Filled 2019-10-25 (×4): qty 2

## 2019-10-25 NOTE — Progress Notes (Signed)
FACULTY PRACTICE ANTEPARTUM PROGRESS NOTE  Jessica Vasquez is a 28 y.o. [id number] at [redacted]w[redacted]d who is admitted for severe pre-eclampsia.  Estimated Date of Delivery: 12/22/19 Fetal presentation is cephalic.  Length of Stay:  4 Days. Admitted 10/21/2019  Subjective: Patient reported some chest pain to RN this am, to me, states it feels like stabbing in her upper back that occasionally radiates forward but has resolved with use of a heating pad. Pain comes and goes, not reproducible and not occurring at the moment. Reports some occasional contractions. Patient reports somewhat decreased fetal movement today compared with the last few days. She denies bleeding and leaking of fluid per vagina.  Vitals:  Blood pressure (!) 143/89, pulse 76, temperature 97.9 F (36.6 C), temperature source Oral, resp. rate 18, height 5\' 10"  (1.778 m), weight 120.1 kg, last menstrual period 03/17/2019, SpO2 95 %, currently breastfeeding. Physical Examination: CONSTITUTIONAL: Well-developed, well-nourished female in no acute distress.  HENT:  Normocephalic, atraumatic, External right and left ear normal. Oropharynx is clear and moist EYES: Conjunctivae and EOM are normal. Pupils are equal, round, and reactive to light. No scleral icterus.  NECK: Normal range of motion, supple, no masses. SKIN: Skin is warm and dry. No rash noted. Not diaphoretic. No erythema. No pallor. NEUROLGIC: Alert and oriented to person, place, and time. Normal reflexes, muscle tone coordination. No cranial nerve deficit noted. PSYCHIATRIC: Normal mood and affect. Normal behavior. Normal judgment and thought content. CARDIOVASCULAR: Normal heart rate noted RESPIRATORY: Effort normal, no problems with respiration noted MUSCULOSKELETAL: Normal range of motion. No edema and no tenderness. ABDOMEN: Soft, nontender, nondistended, gravid. CERVIX: deferred  Fetal monitoring: FHR: 135 bpm, Variability: moderate, Accelerations: Present, Decelerations: Absent   Uterine activity: 1 contractions per hour  Results for orders placed or performed during the hospital encounter of 10/21/19 (from the past 48 hour(s))  Glucose, capillary     Status: Abnormal   Collection Time: 10/23/19 12:57 PM  Result Value Ref Range   Glucose-Capillary 117 (H) 70 - 99 mg/dL  Glucose, capillary     Status: Abnormal   Collection Time: 10/23/19  8:29 PM  Result Value Ref Range   Glucose-Capillary 110 (H) 70 - 99 mg/dL  Glucose, capillary     Status: None   Collection Time: 10/24/19  6:38 AM  Result Value Ref Range   Glucose-Capillary 88 70 - 99 mg/dL  Glucose, capillary     Status: None   Collection Time: 10/24/19  2:23 PM  Result Value Ref Range   Glucose-Capillary 95 70 - 99 mg/dL  Glucose, capillary     Status: None   Collection Time: 10/24/19  8:40 PM  Result Value Ref Range   Glucose-Capillary 88 70 - 99 mg/dL  Type and screen [facility]     Status: None   Collection Time: 10/25/19  4:50 AM  Result Value Ref Range   ABO/RH(D) AB POS    Antibody Screen NEG    Sample Expiration      10/28/2019,2359 Performed at [facility], [address] [place], [place]   CBC     Status: Abnormal   Collection Time: 10/25/19  4:50 AM  Result Value Ref Range   WBC 9.4 4.0 - 10.5 K/uL   RBC 3.65 (L) 3.87 - 5.11 MIL/uL   Hemoglobin 10.3 (L) 12.0 - 15.0 g/dL   HCT 13/08/20 (L) 65.5 - 37.4 %   MCV 86.3 80.0 - 100.0 fL   MCH 28.2 26.0 -  34.0 pg   MCHC 32.7 30.0 - 36.0 g/dL   RDW 13.3 11.5 - 15.5 %   Platelets 189 150 - 400 K/uL   nRBC 0.0 0.0 - 0.2 %    Comment: Performed at [facility], [place] [address], [place], [place] [address]  Comprehensive metabolic panel     Status: Abnormal   Collection Time: 10/25/19  4:50 AM  Result Value Ref Range   Sodium 134 (L) 135 - 145 mmol/L   Potassium 4.2 3.5 - 5.1 mmol/L   Chloride 103 98 - 111 mmol/L   CO2 21 (L) 22 - 32 mmol/L   Glucose, Bld 84 70 - 99 mg/dL   BUN 5 (L) 6 - 20 mg/dL    Creatinine, Ser 0.72 0.44 - 1.00 mg/dL   Calcium 8.8 (L) 8.9 - 10.3 mg/dL   Total Protein 5.2 (L) 6.5 - 8.1 g/dL   Albumin 2.2 (L) 3.5 - 5.0 g/dL   AST 26 15 - 41 U/L   ALT 16 0 - 44 U/L   Alkaline Phosphatase 85 38 - 126 U/L   Total Bilirubin 0.3 0.3 - 1.2 mg/dL   GFR calc non Af Amer >60 >60 mL/min   GFR calc Af Amer >60 >60 mL/min   Anion gap 10 5 - 15    Comment: Performed at [facility], [place] [address] [place],  [address]  Glucose, capillary     Status: None   Collection Time: 10/25/19  5:51 AM  Result Value Ref Range   Glucose-Capillary 76 70 - 99 mg/dL    I have reviewed the patient's current medications.  ASSESSMENT: Active Problems:   HSV (herpes simplex virus) infection   Alpha thalassemia silent carrier   GDM (gestational diabetes mellitus)   Severe pre-eclampsia   Group beta Strep positive   PLAN: Heating pad as needed Titrate BP meds prn (increased to 400 mg labetalol Q8 yesterday) Cont CBG monitoring Cont valtrex PCN at delivery for +GBS S/p BTMZ 10/28-29  Continue routine antenatal care.   Feliz Beam, M.D. Attending Center for [employer] (Faculty Practice)  10/25/2019 7:51 AM

## 2019-10-26 ENCOUNTER — Inpatient Hospital Stay (HOSPITAL_COMMUNITY): Payer: BC Managed Care – PPO

## 2019-10-26 DIAGNOSIS — Z3A31 31 weeks gestation of pregnancy: Secondary | ICD-10-CM

## 2019-10-26 DIAGNOSIS — O99013 Anemia complicating pregnancy, third trimester: Secondary | ICD-10-CM

## 2019-10-26 DIAGNOSIS — O289 Unspecified abnormal findings on antenatal screening of mother: Secondary | ICD-10-CM

## 2019-10-26 DIAGNOSIS — O99213 Obesity complicating pregnancy, third trimester: Secondary | ICD-10-CM

## 2019-10-26 DIAGNOSIS — O133 Gestational [pregnancy-induced] hypertension without significant proteinuria, third trimester: Secondary | ICD-10-CM

## 2019-10-26 LAB — GLUCOSE, CAPILLARY
Glucose-Capillary: 120 mg/dL — ABNORMAL HIGH (ref 70–99)
Glucose-Capillary: 77 mg/dL (ref 70–99)
Glucose-Capillary: 89 mg/dL (ref 70–99)
Glucose-Capillary: 94 mg/dL (ref 70–99)
Glucose-Capillary: 96 mg/dL (ref 70–99)

## 2019-10-26 MED ORDER — FAMOTIDINE 20 MG PO TABS
20.0000 mg | ORAL_TABLET | Freq: Once | ORAL | Status: AC
  Administered 2019-10-26: 20:00:00 20 mg via ORAL
  Filled 2019-10-26: qty 1

## 2019-10-26 MED ORDER — LABETALOL HCL 200 MG PO TABS
600.0000 mg | ORAL_TABLET | Freq: Three times a day (TID) | ORAL | Status: DC
  Administered 2019-10-26 – 2019-10-31 (×15): 600 mg via ORAL
  Filled 2019-10-26 (×15): qty 3

## 2019-10-26 MED ORDER — DOCUSATE SODIUM 100 MG PO CAPS
100.0000 mg | ORAL_CAPSULE | Freq: Two times a day (BID) | ORAL | Status: DC | PRN
  Administered 2019-10-26: 11:00:00 100 mg via ORAL

## 2019-10-26 MED ORDER — SIMETHICONE 80 MG PO CHEW: 80.0000 mg | CHEWABLE_TABLET | Freq: Four times a day (QID) | ORAL | Status: DC | PRN

## 2019-10-26 NOTE — Consult Note (Signed)
Neonatology Consult to Antenatal Patient:  I was asked by Dr. Ilda Basset to see this patient in order to provide antenatal counseling due to prematurity.  Jessica Vasquez was admitted this week for severe pre-eclampsia now at 94 6/[redacted] weeks GA. She is currently not having active labor. 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  +GBS; will receive PCN prophylaxis at time of delivery.  She is receiving Labetalol and prn hydralazine. She has not received IV Magnesium yet.  Good fetal movement without bleeding or ROM.   I spoke with the patient in her room. We discussed the worst case of delivery in the next 1-2 days, including usual DR management, possible respiratory complications and need for support, IV access, feedings (mother desires breast feeding, which was encouraged and is agreeable to donor breastmilk), and LOS. She did have good questions at this time which I answered.  I/we would be glad to come back if she has more questions later.  Thank you for asking me to see this patient.  Jessica Sabal Katherina Mires, MD Neonatologist  The total length of face-to-face or floor/unit time for this encounter was 25 minutes. Counseling and/or coordination of care was 40 minutes of the above.

## 2019-10-26 NOTE — Progress Notes (Signed)
Daily Antepartum Note  Admission Date: 10/21/2019 Current Date: 10/26/2019 9:50 AM  Kathelyn Pinkstaff is a 28 y.o. [id number] @ [redacted]w[redacted]d, HD#6, admitted for severe pre-eclampsia (HA, visual s/s).  Pregnancy complicated by: Patient Active Problem List   Diagnosis Date Noted  . Group beta Strep positive 10/23/2019  . Severe pre-eclampsia 10/21/2019  . Preeclampsia 10/14/2019  . GDM (gestational diabetes mellitus) 10/01/2019  . Gestational hypertension 09/30/2019  . Snoring 08/06/2019  . Alpha thalassemia silent carrier 08/05/2019  . Supervision of other normal pregnancy, antepartum 06/09/2019  . Chronic nasal congestion 05/07/2019  . Nasal polyp 05/07/2019  . Anemia 10/20/2013  . HSV (herpes simplex virus) infection 10/20/2013    Overnight/24hr events:  Labetalol increased to 600 q8h from 400  Subjective:  No s/s of pre-eclampsia, preterm labor or decreased FM  Objective:    Current Vital Signs 24h Vital Sign Ranges  T 97.9 F (36.6 C) Temp  Avg: 97.9 F (36.6 C)  Min: 97.5 F (36.4 C)  Max: 98.3 F (36.8 C)  BP (!) 159/91(notified nurse) BP  Min: 116/79  Max: 159/91  HR 77 Pulse  Avg: 79  Min: 75  Max: 85  RR 18 Resp  Avg: 19  Min: 18  Max: 20  SaO2 99 % Room Air SpO2  Avg: 99.2 %  Min: 98 %  Max: 100 %       24 Hour I/O Current Shift I/O  Time Ins Outs No intake/output data recorded. No intake/output data recorded.   Patient Vitals for the past 24 hrs:  BP Temp Temp src Pulse Resp SpO2  10/26/19 0825 (!) 159/91 97.9 F (36.6 C) Oral 77 18 99 %  10/26/19 0410 (!) 147/93 - - 85 20 98 %  10/25/19 2308 (!) 143/76 98 F (36.7 C) Oral 75 20 99 %  10/25/19 1953 (!) 155/92 98 F (36.7 C) Oral 79 20 100 %  10/25/19 1545 (!) 142/93 98.3 F (36.8 C) Oral 76 18 100 %  10/25/19 1225 116/79 (!) 97.5 F (36.4 C) Oral 82 18 99 %   FHT: 140 baseline, no accel or decel, mod variability Toco: quiet  Physical exam: General: Well nourished, well developed female in no acute  distress. Abdomen: gravid, nttp Cardiovascular: S1, S2 normal, no murmur, rub or gallop, regular rate and rhythm Respiratory: CTAB Extremities: no clubbing, cyanosis or edema Skin: Warm and dry.  Neuro: 1+ brachial b/l  Medications: Current Facility-Administered Medications  Medication Dose Route Frequency Provider Last Rate Last Dose  . acetaminophen (TYLENOL) tablet 650 mg  650 mg Oral Q4H PRN Woodroe Mode, MD   650 mg at 10/25/19 0542  . butalbital-acetaminophen-caffeine (FIORICET) 50-325-40 MG per tablet 2 tablet  2 tablet Oral Q6H PRN Sloan Leiter, MD   2 tablet at 10/24/19 2342  . calcium carbonate (TUMS - dosed in mg elemental calcium) chewable tablet 400 mg of elemental calcium  2 tablet Oral Q4H PRN Woodroe Mode, MD   400 mg of elemental calcium at 10/26/19 0456  . docusate sodium (COLACE) capsule 100 mg  100 mg Oral BID PRN Aletha Halim, MD      . labetalol (NORMODYNE) injection 20 mg  20 mg Intravenous PRN Luvenia Redden, PA-C   20 mg at 10/25/19 0411   And  . labetalol (NORMODYNE) injection 40 mg  40 mg Intravenous PRN Luvenia Redden, PA-C   40 mg at 10/25/19 0428   And  . labetalol (NORMODYNE) injection 80 mg  80 mg Intravenous PRN Marny Lowenstein, PA-C   80 mg at 10/22/19 4696   And  . hydrALAZINE (APRESOLINE) injection 10 mg  10 mg Intravenous PRN Marny Lowenstein, PA-C   10 mg at 10/22/19 0856  . labetalol (NORMODYNE) tablet 600 mg  600 mg Oral Q8H Anyanwu, Ugonna A, MD      . loratadine (CLARITIN) tablet 10 mg  10 mg Oral Daily Lazaro Arms, MD   10 mg at 10/25/19 1208  . ondansetron (ZOFRAN) injection 4 mg  4 mg Intravenous Q6H PRN Adam Phenix, MD   4 mg at 10/24/19 0058  . prenatal multivitamin tablet 1 tablet  1 tablet Oral Q1200 Adam Phenix, MD   1 tablet at 10/25/19 1208  . sodium chloride flush (NS) 0.9 % injection 3 mL  3 mL Intravenous Q12H Conan Bowens, MD   3 mL at 10/26/19 0915  . valACYclovir (VALTREX) tablet 500 mg  500 mg Oral BID  Conan Bowens, MD   500 mg at 10/25/19 2118  . zolpidem (AMBIEN) tablet 5 mg  5 mg Oral QHS PRN Adam Phenix, MD   5 mg at 10/24/19 0109    Labs:  Recent Labs  Lab 10/20/19 1851 10/21/19 0950 10/25/19 0450  WBC 8.7 9.1 9.4  HGB 9.5* 10.6* 10.3*  HCT 29.0* 32.4* 31.5*  PLT 208 233 189    Recent Labs  Lab 10/20/19 1851 10/21/19 0950 10/25/19 0450  NA 134* 137 134*  K 3.9 4.0 4.2  CL 107 106 103  CO2 19* 20* 21*  BUN 5* 5* 5*  CREATININE 0.64 0.70 0.72  CALCIUM 8.2* 8.6* 8.8*  PROT 4.9* 5.7* 5.2*  BILITOT <0.1* 0.3 0.3  ALKPHOS 82 92 85  ALT 19 21 16   AST 28 29 26   GLUCOSE 150* 93 84    Results for STEWART, PIMENTA (MRN ) as of 10/26/2019 09:54  Ref. Range 10/25/2019 05:51 10/25/2019 14:14 10/25/2019 18:30 10/26/2019 00:26 10/26/2019 05:50  Glucose-Capillary Latest Ref Range: 70 - 99 mg/dL 76 82 13/08/2019 (H) 94 96   Radiology:  -11/5: ceph, afi 16, bpp 6/8 (breathing) -11/4: efw 35%, 1700gm, ac 64%, bpp 2/8, afi 13  Assessment & Plan:  Pt stable *Pregnancy: category I tracing. For scheduled bpp later today. D/w her re: r/b/a and desires BTL. Will come by and sign paper work later today *Severe pre-x: continue med titration. Rpt labs q72h -doesn't appear she received Mg on admission. Can Mg if any more s/s of pre-eclampsia, for delivery *GDMa1: normal BS log *h/o HSV: continue valtrex ppx.  *Preterm: will have nicu come by for consult today -s/p bmz on 10/28 and 29 (rpt due 11/12) *PPx: SCDs *FEN/GI: dm diet, sliv *Dispo: d/w her re: hope for 34wk delivery but indications for sooner delivery.   30 MD Attending Center for [facility])

## 2019-10-27 DIAGNOSIS — Z3A32 32 weeks gestation of pregnancy: Secondary | ICD-10-CM

## 2019-10-27 DIAGNOSIS — O24410 Gestational diabetes mellitus in pregnancy, diet controlled: Secondary | ICD-10-CM

## 2019-10-27 DIAGNOSIS — O368190 Decreased fetal movements, unspecified trimester, not applicable or unspecified: Secondary | ICD-10-CM

## 2019-10-27 LAB — GLUCOSE, CAPILLARY
Glucose-Capillary: 88 mg/dL (ref 70–99)
Glucose-Capillary: 128 mg/dL — ABNORMAL HIGH (ref 70–99)

## 2019-10-27 MED ORDER — FAMOTIDINE 20 MG PO TABS
20.0000 mg | ORAL_TABLET | Freq: Two times a day (BID) | ORAL | Status: DC | PRN
  Administered 2019-10-27 – 2019-10-31 (×5): 20 mg via ORAL
  Filled 2019-10-27 (×5): qty 1

## 2019-10-27 NOTE — Progress Notes (Signed)
Daily Antepartum Note  Admission Date: 10/21/2019 Current Date: 10/27/2019 9:14 AM  Jessica Vasquez is a 28 y.o. [id number] @ [redacted]w[redacted]d, HD#7, admitted for severe pre-eclampsia (HA, visual s/s).  Pregnancy complicated by: Patient Active Problem List   Diagnosis Date Noted  . Group beta Strep positive 10/23/2019  . Severe pre-eclampsia 10/21/2019  . Preeclampsia 10/14/2019  . GDM (gestational diabetes mellitus) 10/01/2019  . Gestational hypertension 09/30/2019  . Snoring 08/06/2019  . Alpha thalassemia silent carrier 08/05/2019  . Supervision of other normal pregnancy, antepartum 06/09/2019  . Chronic nasal congestion 05/07/2019  . Nasal polyp 05/07/2019  . Anemia 10/20/2013  . HSV (herpes simplex virus) infection 10/20/2013    Overnight/24hr events:  none  Subjective:  No s/s of pre-eclampsia, preterm labor or decreased FM  Objective:    Current Vital Signs 24h Vital Sign Ranges  T 98.8 F (37.1 C) Temp  Avg: 98.3 F (36.8 C)  Min: 98 F (36.7 C)  Max: 99 F (37.2 C)  BP (!) 154/104 BP  Min: 133/80  Max: 169/100  HR 80 Pulse  Avg: 78.3  Min: 73  Max: 86  RR 20 Resp  Avg: 18.7  Min: 18  Max: 20  SaO2 100 % Room Air SpO2  Avg: 100 %  Min: 100 %  Max: 100 %       24 Hour I/O Current Shift I/O  Time Ins Outs No intake/output data recorded. No intake/output data recorded.   Patient Vitals for the past 24 hrs:  BP Temp Temp src Pulse Resp SpO2  10/27/19 0830 (!) 154/104 98.8 F (37.1 C) Oral 80 20 100 %  10/27/19 0513 133/80 99 F (37.2 C) - 86 18 100 %  10/26/19 2343 136/87 98.2 F (36.8 C) - 73 20 100 %  10/26/19 1955 (!) 159/97 - - 75 - -  10/26/19 1940 (!) 169/100 98 F (36.7 C) - 78 18 100 %  10/26/19 1622 (!) 158/100 - - 73 - -  10/26/19 1620 - 98 F (36.7 C) Oral - 18 -  10/26/19 1358 (!) 146/89 - - 79 - -  10/26/19 1228 (!) 148/100 98 F (36.7 C) Oral 82 18 100 %   FHT: 140 baseline, + accels or decel, mod variability Toco: quiet  Physical exam: General:  Well nourished, well developed female in no acute distress. Abdomen: gravid, nttp Cardiovascular: S1, S2 normal, no murmur, rub or gallop, regular rate and rhythm Respiratory: CTAB Extremities: no clubbing, cyanosis or edema Skin: Warm and dry.   Medications: Current Facility-Administered Medications  Medication Dose Route Frequency Provider Last Rate Last Dose  . acetaminophen (TYLENOL) tablet 650 mg  650 mg Oral Q4H PRN Woodroe Mode, MD   650 mg at 10/26/19 1628  . butalbital-acetaminophen-caffeine (FIORICET) 50-325-40 MG per tablet 2 tablet  2 tablet Oral Q6H PRN Sloan Leiter, MD   2 tablet at 10/24/19 2342  . calcium carbonate (TUMS - dosed in mg elemental calcium) chewable tablet 400 mg of elemental calcium  2 tablet Oral Q4H PRN Woodroe Mode, MD   400 mg of elemental calcium at 10/26/19 1951  . docusate sodium (COLACE) capsule 100 mg  100 mg Oral BID PRN Aletha Halim, MD   100 mg at 10/26/19 1101  . labetalol (NORMODYNE) injection 20 mg  20 mg Intravenous PRN Luvenia Redden, PA-C   20 mg at 10/25/19 0411   And  . labetalol (NORMODYNE) injection 40 mg  40 mg Intravenous PRN Hillard Danker,  Dimas Alexandria, PA-C   40 mg at 10/25/19 0428   And  . labetalol (NORMODYNE) injection 80 mg  80 mg Intravenous PRN Marny Lowenstein, PA-C   80 mg at 10/22/19 6578   And  . hydrALAZINE (APRESOLINE) injection 10 mg  10 mg Intravenous PRN Marny Lowenstein, PA-C   10 mg at 10/22/19 0856  . labetalol (NORMODYNE) tablet 600 mg  600 mg Oral Q8H Anyanwu, Ugonna A, MD   600 mg at 10/27/19 0514  . loratadine (CLARITIN) tablet 10 mg  10 mg Oral Daily Lazaro Arms, MD   10 mg at 10/26/19 1101  . ondansetron (ZOFRAN) injection 4 mg  4 mg Intravenous Q6H PRN Adam Phenix, MD   4 mg at 10/24/19 0058  . prenatal multivitamin tablet 1 tablet  1 tablet Oral Q1200 Adam Phenix, MD   1 tablet at 10/26/19 1101  . simethicone (MYLICON) chewable tablet 80 mg  80 mg Oral QID PRN Adam Phenix, MD      . sodium  chloride flush (NS) 0.9 % injection 3 mL  3 mL Intravenous Q12H Conan Bowens, MD   3 mL at 10/26/19 2110  . valACYclovir (VALTREX) tablet 500 mg  500 mg Oral BID Conan Bowens, MD   500 mg at 10/26/19 2110  . zolpidem (AMBIEN) tablet 5 mg  5 mg Oral QHS PRN Adam Phenix, MD   5 mg at 10/24/19 0109    Labs:  Recent Labs  Lab 10/20/19 1851 10/21/19 0950 10/25/19 0450  WBC 8.7 9.1 9.4  HGB 9.5* 10.6* 10.3*  HCT 29.0* 32.4* 31.5*  PLT 208 233 189    Recent Labs  Lab 10/20/19 1851 10/21/19 0950 10/25/19 0450  NA 134* 137 134*  K 3.9 4.0 4.2  CL 107 106 103  CO2 19* 20* 21*  BUN 5* 5* 5*  CREATININE 0.64 0.70 0.72  CALCIUM 8.2* 8.6* 8.8*  PROT 4.9* 5.7* 5.2*  BILITOT <0.1* 0.3 0.3  ALKPHOS 82 92 85  ALT 19 21 16   AST 28 29 26   GLUCOSE 150* 93 84    Results for AERABELLA, GALASSO (MRN ) as of 10/27/2019 09:15  Ref. Range 10/26/2019 12:30 10/26/2019 15:12 10/26/2019 17:36 10/26/2019 23:40 10/27/2019 06:37  Glucose-Capillary Latest Ref Range: 70 - 99 mg/dL 13/08/2019 (H)  89 77 88   Radiology:  -11/9: ceph, bpp 8/8 -11/4: efw 35%, 1700gm, ac 64%, bpp 2/8, afi 13  Assessment & Plan:  Pt stable *Pregnancy: reactive nst. -btl papers signed 11/9 *Severe pre-x: continue med titration. Rpt labs q72h -doesn't appear she received Mg on admission. Can Mg if any more s/s of pre-eclampsia, for delivery *GDMa1: normal BS log *h/o HSV: continue valtrex ppx.  *Preterm:  -s/p nicu consult -s/p bmz on 10/28 and 29 (rpt due 11/12) *PPx: SCDs *FEN/GI: dm diet, sliv *Dispo: d/w her re: hope for 34wk delivery but indications for sooner delivery.   30 MD Attending Center for [facility])

## 2019-10-27 NOTE — Progress Notes (Signed)
OB Note BTL papers signed with patient and placed in paper chart at [place] for [employer] (Faculty Practice) 10/26/2019 Time: [phone]

## 2019-10-28 ENCOUNTER — Encounter: Payer: BC Managed Care – PPO | Admitting: Obstetrics & Gynecology

## 2019-10-28 LAB — COMPREHENSIVE METABOLIC PANEL
ALT: 17 U/L (ref 0–44)
AST: 22 U/L (ref 15–41)
Albumin: 2.2 g/dL — ABNORMAL LOW (ref 3.5–5.0)
Alkaline Phosphatase: 95 U/L (ref 38–126)
Anion gap: 11 (ref 5–15)
BUN: 7 mg/dL (ref 6–20)
CO2: 19 mmol/L — ABNORMAL LOW (ref 22–32)
Calcium: 8.9 mg/dL (ref 8.9–10.3)
Chloride: 106 mmol/L (ref 98–111)
Creatinine, Ser: 0.67 mg/dL (ref 0.44–1.00)
GFR calc Af Amer: >60 mL/min (ref >60)
GFR calc non Af Amer: >60 mL/min (ref >60)
Glucose, Bld: 87 mg/dL (ref 70–99)
Potassium: 3.8 mmol/L (ref 3.5–5.1)
Sodium: 136 mmol/L (ref 135–145)
Total Bilirubin: 0.3 mg/dL (ref 0.3–1.2)
Total Protein: 5.2 g/dL — ABNORMAL LOW (ref 6.5–8.1)

## 2019-10-28 LAB — CBC
HCT: 32.5 % — ABNORMAL LOW (ref 36.0–46.0)
Hemoglobin: 10.5 g/dL — ABNORMAL LOW (ref 12.0–15.0)
MCH: 28.2 pg (ref 26.0–34.0)
MCHC: 32.3 g/dL (ref 30.0–36.0)
MCV: 87.1 fL (ref 80.0–100.0)
Platelets: 171 10*3/uL (ref 150–400)
RBC: 3.73 MIL/uL — ABNORMAL LOW (ref 3.87–5.11)
RDW: 13.5 % (ref 11.5–15.5)
WBC: 9.8 10*3/uL (ref 4.0–10.5)
nRBC: 0 % (ref 0.0–0.2)

## 2019-10-28 LAB — TYPE AND SCREEN
ABO/RH(D): AB POS
Antibody Screen: NEGATIVE

## 2019-10-28 LAB — GLUCOSE, CAPILLARY: Glucose-Capillary: 114 mg/dL — ABNORMAL HIGH (ref 70–99)

## 2019-10-28 NOTE — Progress Notes (Signed)
CBG @ 1550 - 137- 2hr PP

## 2019-10-28 NOTE — Progress Notes (Signed)
Daily Antepartum Note  Admission Date: 10/21/2019 Current Date: 10/28/2019 10:53 AM  Jessica Vasquez is a 28 y.o. [id number] @ [redacted]w[redacted]d, HD#8, admitted for severe pre-eclampsia (HA, visual s/s).  Pregnancy complicated by: Patient Active Problem List   Diagnosis Date Noted  . Group beta Strep positive 10/23/2019  . Severe pre-eclampsia 10/21/2019  . Preeclampsia 10/14/2019  . GDM (gestational diabetes mellitus) 10/01/2019  . Gestational hypertension 09/30/2019  . Snoring 08/06/2019  . Alpha thalassemia silent carrier 08/05/2019  . Supervision of other normal pregnancy, antepartum 06/09/2019  . Chronic nasal congestion 05/07/2019  . Nasal polyp 05/07/2019  . Anemia 10/20/2013  . HSV (herpes simplex virus) infection 10/20/2013    Overnight/24hr events:  none  Subjective:  No s/s of pre-eclampsia, preterm labor or decreased FM  Objective:    Current Vital Signs 24h Vital Sign Ranges  T 97.6 F (36.4 C) Temp  Avg: 98.1 F (36.7 C)  Min: 97.6 F (36.4 C)  Max: 98.5 F (36.9 C)  BP (!) 149/99 BP  Min: 132/96  Max: 186/100  HR 83 Pulse  Avg: 78  Min: 73  Max: 84  RR 18 Resp  Avg: 18.4  Min: 18  Max: 20  SaO2 98 % Room Air SpO2  Avg: 99.3 %  Min: 98 %  Max: 100 %       24 Hour I/O Current Shift I/O  Time Ins Outs No intake/output data recorded. No intake/output data recorded.   Patient Vitals for the past 24 hrs:  BP Temp Temp src Pulse Resp SpO2  10/28/19 0807 (!) 149/99 97.6 F (36.4 C) Oral 83 18 98 %  10/28/19 0700 (!) 146/92 - - 73 - -  10/28/19 0645 (!) 169/100 - - 74 - -  10/28/19 0632 (!) 186/100 - - 77 - -  10/28/19 0630 (!) 170/107 - - 77 - 99 %  10/27/19 2303 (!) 132/96 98.5 F (36.9 C) Oral 77 18 100 %  10/27/19 1935 (!) 153/106 97.7 F (36.5 C) Oral 84 18 100 %  10/27/19 1525 139/81 98.5 F (36.9 C) Oral 80 18 100 %  10/27/19 1200 - - - - - 98 %  10/27/19 1159 (!) 142/86 98.4 F (36.9 C) Oral 77 20 100 %   FHT: 140 baseline, no accel or decel, mod  variability Toco: quiet  Physical exam: General: Well nourished, well developed female in no acute distress. Abdomen: gravid, nttp Cardiovascular: S1, S2 normal, no murmur, rub or gallop, regular rate and rhythm Respiratory: CTAB Extremities: no clubbing, cyanosis or edema Skin: Warm and dry.   Medications: Current Facility-Administered Medications  Medication Dose Route Frequency Provider Last Rate Last Dose  . acetaminophen (TYLENOL) tablet 650 mg  650 mg Oral Q4H PRN Woodroe Mode, MD   650 mg at 10/26/19 1628  . butalbital-acetaminophen-caffeine (FIORICET) 50-325-40 MG per tablet 2 tablet  2 tablet Oral Q6H PRN Sloan Leiter, MD   2 tablet at 10/24/19 2342  . calcium carbonate (TUMS - dosed in mg elemental calcium) chewable tablet 400 mg of elemental calcium  2 tablet Oral Q4H PRN Woodroe Mode, MD   400 mg of elemental calcium at 10/28/19 0656  . docusate sodium (COLACE) capsule 100 mg  100 mg Oral BID PRN Aletha Halim, MD   100 mg at 10/26/19 1101  . famotidine (PEPCID) tablet 20 mg  20 mg Oral BID PRN Aletha Halim, MD   20 mg at 10/27/19 1326  . labetalol (NORMODYNE) injection 20  mg  20 mg Intravenous PRN Marny Lowenstein, PA-C   20 mg at 10/28/19 9924   And  . labetalol (NORMODYNE) injection 40 mg  40 mg Intravenous PRN Marny Lowenstein, PA-C   40 mg at 10/25/19 0428   And  . labetalol (NORMODYNE) injection 80 mg  80 mg Intravenous PRN Marny Lowenstein, PA-C   80 mg at 10/22/19 2683   And  . hydrALAZINE (APRESOLINE) injection 10 mg  10 mg Intravenous PRN Marny Lowenstein, PA-C   10 mg at 10/22/19 0856  . labetalol (NORMODYNE) tablet 600 mg  600 mg Oral Q8H Anyanwu, Ugonna A, MD   600 mg at 10/28/19 0614  . loratadine (CLARITIN) tablet 10 mg  10 mg Oral Daily Lazaro Arms, MD   10 mg at 10/28/19 1018  . ondansetron (ZOFRAN) injection 4 mg  4 mg Intravenous Q6H PRN Adam Phenix, MD   4 mg at 10/28/19 0656  . prenatal multivitamin tablet 1 tablet  1 tablet Oral Q1200  Adam Phenix, MD   1 tablet at 10/27/19 [id number]  . simethicone (MYLICON) chewable tablet 80 mg  80 mg Oral QID PRN Adam Phenix, MD      . sodium chloride flush (NS) 0.9 % injection 3 mL  3 mL Intravenous Q12H Conan Bowens, MD   3 mL at 10/28/19 1018  . valACYclovir (VALTREX) tablet 500 mg  500 mg Oral BID Conan Bowens, MD   500 mg at 10/28/19 1018  . zolpidem (AMBIEN) tablet 5 mg  5 mg Oral QHS PRN Adam Phenix, MD   5 mg at 10/24/19 0109    Labs:  Recent Labs  Lab 10/25/19 0450 10/28/19 0723  WBC 9.4 9.8  HGB 10.3* 10.5*  HCT 31.5* 32.5*  PLT 189 171    Recent Labs  Lab 10/25/19 0450 10/28/19 0723  NA 134* 136  K 4.2 3.8  CL 103 106  CO2 21* 19*  BUN 5* 7  CREATININE 0.72 0.67  CALCIUM 8.8* 8.9  PROT 5.2* 5.2*  BILITOT 0.3 0.3  ALKPHOS 85 95  ALT 16 17  AST 26 22  GLUCOSE 84 87    AM fasting 87 Results for VALEEN, BORYS (MRN [id number]) as of 10/28/2019 10:54  Ref. Range 10/26/2019 23:40 10/27/2019 06:37 10/27/2019 13:49 10/27/2019 20:40  Glucose-Capillary Latest Ref Range: 70 - 99 mg/dL 77 88 119 (H) 417 (H)   Radiology:  -11/9: ceph, bpp 8/8 -11/4: efw 35%, 1700gm, ac 64%, bpp 2/8, afi 13  Assessment & Plan:  Pt stable *Pregnancy: obtain bpp prn if not reactive -btl papers signed 11/9 *Severe pre-x: continue med titration. Rpt labs q72h -doesn't appear she received Mg on admission. Can Mg if any more s/s of pre-eclampsia, for delivery *GDMa1: normal BS log *h/o HSV: continue valtrex ppx.  *Preterm:  -s/p nicu consult -s/p bmz on 10/28 and 29 (rpt due 11/12) *PPx: SCDs *FEN/GI: dm diet, sliv *Dispo: d/w her re: hope for 34wk delivery but indications for sooner delivery.   Cornelia Copa MD Attending Center for [facility])

## 2019-10-28 NOTE — Progress Notes (Signed)
Patient's fasting blood sugar was 87 at 6:27am

## 2019-10-29 ENCOUNTER — Inpatient Hospital Stay (HOSPITAL_COMMUNITY): Payer: BC Managed Care – PPO

## 2019-10-29 DIAGNOSIS — Z3A32 32 weeks gestation of pregnancy: Secondary | ICD-10-CM

## 2019-10-29 DIAGNOSIS — O133 Gestational [pregnancy-induced] hypertension without significant proteinuria, third trimester: Secondary | ICD-10-CM

## 2019-10-29 DIAGNOSIS — O99213 Obesity complicating pregnancy, third trimester: Secondary | ICD-10-CM

## 2019-10-29 DIAGNOSIS — O99013 Anemia complicating pregnancy, third trimester: Secondary | ICD-10-CM

## 2019-10-29 DIAGNOSIS — O1413 Severe pre-eclampsia, third trimester: Secondary | ICD-10-CM

## 2019-10-29 LAB — GLUCOSE, CAPILLARY
Glucose-Capillary: 87 mg/dL (ref 70–99)
Glucose-Capillary: 137 mg/dL — ABNORMAL HIGH (ref 70–99)
Glucose-Capillary: 85 mg/dL (ref 70–99)
Glucose-Capillary: 77 mg/dL (ref 70–99)
Glucose-Capillary: 126 mg/dL — ABNORMAL HIGH (ref 70–99)
Glucose-Capillary: 142 mg/dL — ABNORMAL HIGH (ref 70–99)

## 2019-10-29 MED ORDER — BETAMETHASONE SOD PHOS & ACET 6 (3-3) MG/ML IJ SUSP
12.0000 mg | INTRAMUSCULAR | Status: AC
  Administered 2019-10-29 – 2019-10-30 (×2): 12 mg via INTRAMUSCULAR
  Filled 2019-10-29 (×2): qty 5

## 2019-10-29 NOTE — Progress Notes (Signed)
CBG 77 fasting

## 2019-10-29 NOTE — Progress Notes (Signed)
Daily Antepartum Note  Admission Date: 10/21/2019 Current Date: 10/29/2019 9:59 AM  Jessica Vasquez is a 28 y.o. [id number] @ [redacted]w[redacted]d, HD#9, admitted for severe pre-eclampsia (HA, visual s/s).  Pregnancy complicated by: Patient Active Problem List   Diagnosis Date Noted  . Group beta Strep positive 10/23/2019  . Severe pre-eclampsia 10/21/2019  . Preeclampsia 10/14/2019  . GDM (gestational diabetes mellitus) 10/01/2019  . Gestational hypertension 09/30/2019  . Snoring 08/06/2019  . Alpha thalassemia silent carrier 08/05/2019  . Supervision of other normal pregnancy, antepartum 06/09/2019  . Chronic nasal congestion 05/07/2019  . Nasal polyp 05/07/2019  . Anemia 10/20/2013  . HSV (herpes simplex virus) infection 10/20/2013    Overnight/24hr events:  none  Subjective:  No s/s of pre-eclampsia, preterm labor or decreased FM  Objective:    Current Vital Signs 24h Vital Sign Ranges  T 98.3 F (36.8 C) Temp  Avg: 98 F (36.7 C)  Min: 97.7 F (36.5 C)  Max: 98.3 F (36.8 C)  BP (!) 152/86 BP  Min: 145/104  Max: 157/95  HR 76 Pulse  Avg: 72.8  Min: 68  Max: 76  RR 18 Resp  Avg: 18  Min: 18  Max: 18  SaO2 100 % Room Air SpO2  Avg: 99.8 %  Min: 99 %  Max: 100 %       24 Hour I/O Current Shift I/O  Time Ins Outs No intake/output data recorded. No intake/output data recorded.   Patient Vitals for the past 24 hrs:  BP Temp Temp src Pulse Resp SpO2  10/29/19 0730 (!) 152/86 98.3 F (36.8 C) Oral 76 18 100 %  10/29/19 0524 (!) 157/95 97.9 F (36.6 C) Oral 73 18 100 %  10/28/19 2342 (!) 150/98 97.9 F (36.6 C) Oral 73 18 100 %  10/28/19 1926 (!) 146/80 98 F (36.7 C) Oral 75 18 100 %  10/28/19 1637 (!) 145/104 - - 72 18 99 %  10/28/19 1316 (!) 145/92 97.7 F (36.5 C) Oral 68 18 100 %   FHT: 135 baseline, no accel or decel, mod variability Toco: quiet  Physical exam: General: Well nourished, well developed female in no acute distress. Abdomen: gravid, nttp Cardiovascular: S1,  S2 normal, no murmur, rub or gallop, regular rate and rhythm Respiratory: CTAB Extremities: no clubbing, cyanosis or edema Skin: Warm and dry.   Medications: Current Facility-Administered Medications  Medication Dose Route Frequency Provider Last Rate Last Dose  . acetaminophen (TYLENOL) tablet 650 mg  650 mg Oral Q4H PRN Woodroe Mode, MD   650 mg at 10/28/19 1645  . butalbital-acetaminophen-caffeine (FIORICET) 50-325-40 MG per tablet 2 tablet  2 tablet Oral Q6H PRN Sloan Leiter, MD   2 tablet at 10/24/19 2342  . calcium carbonate (TUMS - dosed in mg elemental calcium) chewable tablet 400 mg of elemental calcium  2 tablet Oral Q4H PRN Woodroe Mode, MD   400 mg of elemental calcium at 10/29/19 0155  . docusate sodium (COLACE) capsule 100 mg  100 mg Oral BID PRN Aletha Halim, MD   100 mg at 10/26/19 1101  . famotidine (PEPCID) tablet 20 mg  20 mg Oral BID PRN Aletha Halim, MD   20 mg at 10/29/19 0155  . labetalol (NORMODYNE) injection 20 mg  20 mg Intravenous PRN Luvenia Redden, PA-C   20 mg at 10/28/19 4540   And  . labetalol (NORMODYNE) injection 40 mg  40 mg Intravenous PRN Luvenia Redden, PA-C   40  mg at 10/25/19 0428   And  . labetalol (NORMODYNE) injection 80 mg  80 mg Intravenous PRN Marny Lowenstein, PA-C   80 mg at 10/22/19 7425   And  . hydrALAZINE (APRESOLINE) injection 10 mg  10 mg Intravenous PRN Marny Lowenstein, PA-C   10 mg at 10/22/19 0856  . labetalol (NORMODYNE) tablet 600 mg  600 mg Oral Q8H Anyanwu, Ugonna A, MD   600 mg at 10/29/19 0520  . loratadine (CLARITIN) tablet 10 mg  10 mg Oral Daily Lazaro Arms, MD   10 mg at 10/28/19 1018  . ondansetron (ZOFRAN) injection 4 mg  4 mg Intravenous Q6H PRN Adam Phenix, MD   4 mg at 10/29/19 0615  . prenatal multivitamin tablet 1 tablet  1 tablet Oral Q1200 Adam Phenix, MD   1 tablet at 10/28/19 1318  . simethicone (MYLICON) chewable tablet 80 mg  80 mg Oral QID PRN Adam Phenix, MD      . sodium  chloride flush (NS) 0.9 % injection 3 mL  3 mL Intravenous Q12H Conan Bowens, MD   3 mL at 10/28/19 2113  . valACYclovir (VALTREX) tablet 500 mg  500 mg Oral BID Conan Bowens, MD   500 mg at 10/28/19 2113  . zolpidem (AMBIEN) tablet 5 mg  5 mg Oral QHS PRN Adam Phenix, MD   5 mg at 10/24/19 0109    Labs:  Recent Labs  Lab 10/25/19 0450 10/28/19 0723  WBC 9.4 9.8  HGB 10.3* 10.5*  HCT 31.5* 32.5*  PLT 189 171    Recent Labs  Lab 10/25/19 0450 10/28/19 0723  NA 134* 136  K 4.2 3.8  CL 103 106  CO2 21* 19*  BUN 5* 7  CREATININE 0.72 0.67  CALCIUM 8.8* 8.9  PROT 5.2* 5.2*  BILITOT 0.3 0.3  ALKPHOS 85 95  ALT 16 17  AST 26 22  GLUCOSE 84 87    Results for Jessica Vasquez, Jessica Vasquez (MRN [id number]) as of 10/29/2019 09:59  Ref. Range 10/26/2019 23:40 10/27/2019 06:37 10/27/2019 13:49 10/27/2019 20:40  Glucose-Capillary Latest Ref Range: 70 - 99 mg/dL 77 88 332 (H) 951 (H)   Radiology:  -11/9: ceph, bpp 8/8 -11/4: efw 35%, 1700gm, ac 64%, bpp 2/8, afi 13  Assessment & Plan:  Pt stable *Pregnancy: obtain bpp prn if not reactive -btl papers signed 11/9 *Severe pre-x: continue med titration. Rpt labs q72h -doesn't appear she received Mg on admission. Can Mg if any more s/s of pre-eclampsia, for delivery *GDMa1: normal BS log *h/o HSV: continue valtrex ppx.  *Preterm: pt okay with rpt bmz course today and tomorrow -s/p nicu consult -s/p bmz on 10/28 and 29  *PPx: SCDs *FEN/GI: dm diet, sliv *Dispo: d/w her re: hope for 34wk delivery but indications for sooner delivery.   Cornelia Copa MD Attending Center for [facility])

## 2019-10-29 NOTE — Progress Notes (Signed)
Initial Nutrition Assessment  DOCUMENTATION CODES:   Obesity unspecified  INTERVENTION:  CHO modified gestational diabetic diet Have pt request double protein portions if  hungry prior to meals   NUTRITION DIAGNOSIS:  Increased nutrient needs related to (pregnancy and fetal growth requirements) as evidenced by (32 weeks IUP).  GOAL:  Patient will meet greater than or equal to 90% of their needs  MONITOR:  Weight trends  REASON FOR ASSESSMENT:  Gestational Diabetes, Antenatal   ASSESSMENT:   32 2/7, adm with PEC. delivery planned for 34 weeks. Initial PN visit wt 113.8 kg, BMI 36. 14 lb overall wt gain. GDM is diet controlled   Diet Order:   Diet Order            Diet gestational carb mod Fluid consistency: Thin; Room service appropriate? Yes  Diet effective now              EDUCATION NEEDS:   Education needs have been addressed(outpt education on GDM diet 10/13/19)  Skin:  Skin Assessment: Reviewed RN Assessment   Height:   Ht Readings from Last 1 Encounters:  10/21/19 5\' 10"  (1.778 m)    Weight:   Wt Readings from Last 1 Encounters:  10/21/19 120.1 kg    Ideal Body Weight:    150 lbs BMI:  Body mass index is 37.99 kg/m.  Estimated Nutritional Needs:   Kcal:  2400-2600  Protein:  105-115 g  Fluid:  2.7 L    Weyman Rodney M.Fredderick Severance LDN Neonatal Nutrition Support Specialist/RD III Pager [phone]      Phone [phone]

## 2019-10-29 NOTE — Progress Notes (Signed)
CBG 85 @ 2130, 2hr PP

## 2019-10-30 LAB — GLUCOSE, CAPILLARY
Glucose-Capillary: 103 mg/dL — ABNORMAL HIGH (ref 70–99)
Glucose-Capillary: 136 mg/dL — ABNORMAL HIGH (ref 70–99)
Glucose-Capillary: 160 mg/dL — ABNORMAL HIGH (ref 70–99)

## 2019-10-30 NOTE — Progress Notes (Signed)
Patient has order for CPAP at  Night for OSA.  Patient uses CPAP at home and wears a full face mask at home.  Her settings are 14 cmH20 with a ramp that starts at 4 (per patient). RT placed patient on our CPAP unit with her home settings using an XL full face mask as this is what she wears at home.  RT will continue to monitor patient.

## 2019-10-30 NOTE — Progress Notes (Signed)
Daily Antepartum Note  Admission Date: 10/21/2019 Current Date: 10/30/2019 12:12 PM  Jessica Vasquez is a 28 y.o. [id number] @ [redacted]w[redacted]d, HD#10, admitted for severe pre-eclampsia (HA, visual s/s).  Pregnancy complicated by: Patient Active Problem List   Diagnosis Date Noted  . Group beta Strep positive 10/23/2019  . Severe pre-eclampsia 10/21/2019  . Preeclampsia 10/14/2019  . GDM (gestational diabetes mellitus) 10/01/2019  . Gestational hypertension 09/30/2019  . Snoring 08/06/2019  . Alpha thalassemia silent carrier 08/05/2019  . Supervision of other normal pregnancy, antepartum 06/09/2019  . Chronic nasal congestion 05/07/2019  . Nasal polyp 05/07/2019  . Anemia 10/20/2013  . HSV (herpes simplex virus) infection 10/20/2013    Overnight/24hr events:  none  Subjective:  No s/s of pre-eclampsia, preterm labor or decreased FM  Objective:    Current Vital Signs 24h Vital Sign Ranges  T 98.2 F (36.8 C) Temp  Avg: 98.2 F (36.8 C)  Min: 97.3 F (36.3 C)  Max: 98.7 F (37.1 C)  BP (!) 144/84 BP  Min: 113/63  Max: 148/94  HR 83 Pulse  Avg: 81.5  Min: 75  Max: 84  RR 18 Resp  Avg: 19.7  Min: 18  Max: 20  SaO2 100 % Room Air SpO2  Avg: 98.6 %  Min: 97 %  Max: 100 %       24 Hour I/O Current Shift I/O  Time Ins Outs No intake/output data recorded. No intake/output data recorded.   Patient Vitals for the past 24 hrs:  BP Temp Temp src Pulse Resp SpO2  10/30/19 0903 (!) 144/84 98.2 F (36.8 C) - 83 18 100 %  10/30/19 0605 (!) 148/94 - - 82 20 -  10/30/19 0320 113/63 (!) 97.3 F (36.3 C) Oral 83 20 97 %  10/29/19 2248 (!) 142/82 98.4 F (36.9 C) Oral 82 20 100 %  10/29/19 1918 (!) 142/85 98.7 F (37.1 C) - 84 20 98 %  10/29/19 1605 140/85 98.3 F (36.8 C) Oral 75 20 98 %   FHT: 135 baseline, no accel or decel, mod variability Toco: quiet  Physical exam: General: Well nourished, well developed female in no acute distress. Abdomen: gravid, nttp Cardiovascular: S1, S2  normal, no murmur, rub or gallop, regular rate and rhythm Respiratory: CTAB Extremities: no clubbing, cyanosis or edema Skin: Warm and dry.   Medications: Current Facility-Administered Medications  Medication Dose Route Frequency Provider Last Rate Last Dose  . acetaminophen (TYLENOL) tablet 650 mg  650 mg Oral Q4H PRN Woodroe Mode, MD   650 mg at 10/30/19 0321  . butalbital-acetaminophen-caffeine (FIORICET) 50-325-40 MG per tablet 2 tablet  2 tablet Oral Q6H PRN Sloan Leiter, MD   2 tablet at 10/24/19 2342  . calcium carbonate (TUMS - dosed in mg elemental calcium) chewable tablet 400 mg of elemental calcium  2 tablet Oral Q4H PRN Woodroe Mode, MD   400 mg of elemental calcium at 10/30/19 0013  . docusate sodium (COLACE) capsule 100 mg  100 mg Oral BID PRN Aletha Halim, MD   100 mg at 10/26/19 1101  . famotidine (PEPCID) tablet 20 mg  20 mg Oral BID PRN Aletha Halim, MD   20 mg at 10/30/19 0013  . labetalol (NORMODYNE) injection 20 mg  20 mg Intravenous PRN Luvenia Redden, PA-C   20 mg at 10/28/19 5329   And  . labetalol (NORMODYNE) injection 40 mg  40 mg Intravenous PRN Luvenia Redden, PA-C   40 mg at  10/25/19 0428   And  . labetalol (NORMODYNE) injection 80 mg  80 mg Intravenous PRN Marny Lowenstein, PA-C   80 mg at 10/22/19 3845   And  . hydrALAZINE (APRESOLINE) injection 10 mg  10 mg Intravenous PRN Marny Lowenstein, PA-C   10 mg at 10/22/19 0856  . labetalol (NORMODYNE) tablet 600 mg  600 mg Oral Q8H Anyanwu, Ugonna A, MD   600 mg at 10/30/19 0603  . loratadine (CLARITIN) tablet 10 mg  10 mg Oral Daily Lazaro Arms, MD   10 mg at 10/30/19 1013  . ondansetron (ZOFRAN) injection 4 mg  4 mg Intravenous Q6H PRN Adam Phenix, MD   4 mg at 10/29/19 0615  . prenatal multivitamin tablet 1 tablet  1 tablet Oral Q1200 Adam Phenix, MD   1 tablet at 10/30/19 1013  . simethicone (MYLICON) chewable tablet 80 mg  80 mg Oral QID PRN Adam Phenix, MD      . sodium chloride  flush (NS) 0.9 % injection 3 mL  3 mL Intravenous Q12H Conan Bowens, MD   3 mL at 10/29/19 2203  . valACYclovir (VALTREX) tablet 500 mg  500 mg Oral BID Conan Bowens, MD   500 mg at 10/30/19 1013  . zolpidem (AMBIEN) tablet 5 mg  5 mg Oral QHS PRN Adam Phenix, MD   5 mg at 10/24/19 0109    Labs:  Recent Labs  Lab 10/25/19 0450 10/28/19 0723  WBC 9.4 9.8  HGB 10.3* 10.5*  HCT 31.5* 32.5*  PLT 189 171    Recent Labs  Lab 10/25/19 0450 10/28/19 0723  NA 134* 136  K 4.2 3.8  CL 103 106  CO2 21* 19*  BUN 5* 7  CREATININE 0.72 0.67  CALCIUM 8.8* 8.9  PROT 5.2* 5.2*  BILITOT 0.3 0.3  ALKPHOS 85 95  ALT 16 17  AST 26 22  GLUCOSE 84 87    Results for Jessica Vasquez (MRN [id number]) as of 10/29/2019 09:59  Ref. Range 10/26/2019 23:40 10/27/2019 06:37 10/27/2019 13:49 10/27/2019 20:40  Glucose-Capillary Latest Ref Range: 70 - 99 mg/dL 77 88 224 (H) 825 (H)   Radiology:  -11/12: ceph, bpp 8/8, afi 13.9 -11/4: efw 35%, 1700gm, ac 64%, bpp 2/8, afi 13  Assessment & Plan:  Pt stable *Pregnancy: reactive NSTs -btl papers signed 11/9 *Severe pre-x: continue med titration. Rpt labs q72h -doesn't appear she received Mg on admission. Can Mg if any more s/s of pre-eclampsia, for delivery *GDMa1: normal BS log *h/o HSV: continue valtrex ppx.  *Preterm: rescue bmz course finishes today -s/p nicu consult -s/p bmz on 10/28 and 29  *PPx: SCDs *FEN/GI: dm diet, sliv *Dispo: d/w her re: hope for 34wk delivery but indications for sooner delivery.   Cornelia Copa MD Attending Center for [facility])

## 2019-10-30 NOTE — Plan of Care (Signed)
  Problem: Pain Managment: Goal: General experience of comfort will improve Outcome: Completed/Met   Problem: Education: Goal: Knowledge of disease or condition will improve Outcome: Completed/Met Goal: Knowledge of the prescribed therapeutic regimen will improve Outcome: Completed/Met   Problem: Education: Goal: Knowledge of disease or condition will improve Outcome: Completed/Met Goal: Knowledge of the prescribed therapeutic regimen will improve Outcome: Completed/Met

## 2019-10-31 ENCOUNTER — Inpatient Hospital Stay (HOSPITAL_COMMUNITY): Payer: BC Managed Care – PPO | Admitting: Anesthesiology

## 2019-10-31 ENCOUNTER — Inpatient Hospital Stay (HOSPITAL_COMMUNITY): Payer: BC Managed Care – PPO

## 2019-10-31 ENCOUNTER — Encounter (HOSPITAL_COMMUNITY): Payer: Self-pay

## 2019-10-31 ENCOUNTER — Encounter (HOSPITAL_COMMUNITY)
Admission: AD | Disposition: A | Discharge status: Home / Self Care | Payer: Self-pay | Source: Home / Self Care | Attending: Obstetrics and Gynecology

## 2019-10-31 DIAGNOSIS — O368390 Maternal care for abnormalities of the fetal heart rate or rhythm, unspecified trimester, not applicable or unspecified: Secondary | ICD-10-CM

## 2019-10-31 DIAGNOSIS — Z3A32 32 weeks gestation of pregnancy: Secondary | ICD-10-CM

## 2019-10-31 DIAGNOSIS — O1414 Severe pre-eclampsia complicating childbirth: Secondary | ICD-10-CM

## 2019-10-31 DIAGNOSIS — O24429 Gestational diabetes mellitus in childbirth, unspecified control: Secondary | ICD-10-CM

## 2019-10-31 DIAGNOSIS — Z302 Encounter for sterilization: Secondary | ICD-10-CM

## 2019-10-31 HISTORY — DX: Maternal care for abnormalities of the fetal heart rate or rhythm, unspecified trimester, not applicable or unspecified: O36.8390

## 2019-10-31 HISTORY — PX: TUBAL LIGATION: SHX77

## 2019-10-31 HISTORY — DX: 32 weeks gestation of pregnancy: Z3A.32

## 2019-10-31 LAB — COMPREHENSIVE METABOLIC PANEL
ALT: 19 U/L (ref 0–44)
AST: 24 U/L (ref 15–41)
Albumin: 2.2 g/dL — ABNORMAL LOW (ref 3.5–5.0)
Alkaline Phosphatase: 86 U/L (ref 38–126)
Anion gap: 10 (ref 5–15)
BUN: 10 mg/dL (ref 6–20)
CO2: 18 mmol/L — ABNORMAL LOW (ref 22–32)
Calcium: 8.4 mg/dL — ABNORMAL LOW (ref 8.9–10.3)
Chloride: 106 mmol/L (ref 98–111)
Creatinine, Ser: 0.75 mg/dL (ref 0.44–1.00)
GFR calc Af Amer: >60 mL/min (ref >60)
GFR calc non Af Amer: >60 mL/min (ref >60)
Glucose, Bld: 110 mg/dL — ABNORMAL HIGH (ref 70–99)
Potassium: 4 mmol/L (ref 3.5–5.1)
Sodium: 134 mmol/L — ABNORMAL LOW (ref 135–145)
Total Bilirubin: 0.2 mg/dL — ABNORMAL LOW (ref 0.3–1.2)
Total Protein: 5.3 g/dL — ABNORMAL LOW (ref 6.5–8.1)

## 2019-10-31 LAB — CBC
HCT: 31.2 % — ABNORMAL LOW (ref 36.0–46.0)
Hemoglobin: 10.3 g/dL — ABNORMAL LOW (ref 12.0–15.0)
MCH: 28.4 pg (ref 26.0–34.0)
MCHC: 33 g/dL (ref 30.0–36.0)
MCV: 86 fL (ref 80.0–100.0)
Platelets: 193 10*3/uL (ref 150–400)
RBC: 3.63 MIL/uL — ABNORMAL LOW (ref 3.87–5.11)
RDW: 13.9 % (ref 11.5–15.5)
WBC: 10.8 10*3/uL — ABNORMAL HIGH (ref 4.0–10.5)
nRBC: 0.6 % — ABNORMAL HIGH (ref 0.0–0.2)

## 2019-10-31 LAB — GLUCOSE, CAPILLARY
Glucose-Capillary: 112 mg/dL — ABNORMAL HIGH (ref 70–99)
Glucose-Capillary: 105 mg/dL — ABNORMAL HIGH (ref 70–99)
Glucose-Capillary: 96 mg/dL (ref 70–99)

## 2019-10-31 LAB — TYPE AND SCREEN
ABO/RH(D): AB POS
Antibody Screen: NEGATIVE

## 2019-10-31 SURGERY — Surgical Case
Anesthesia: Spinal

## 2019-10-31 MED ORDER — FENTANYL CITRATE (PF) 100 MCG/2ML IJ SOLN
INTRAMUSCULAR | Status: AC
  Filled 2019-10-31: qty 4

## 2019-10-31 MED ORDER — PHENYLEPHRINE HCL-NACL 20-0.9 MG/250ML-% IV SOLN
INTRAVENOUS | Status: DC | PRN
  Administered 2019-10-31: 30 ug/min via INTRAVENOUS

## 2019-10-31 MED ORDER — FENTANYL CITRATE (PF) 100 MCG/2ML IJ SOLN
INTRAMUSCULAR | Status: DC | PRN
  Administered 2019-10-31: 15 ug via INTRATHECAL

## 2019-10-31 MED ORDER — LABETALOL HCL 5 MG/ML IV SOLN
INTRAVENOUS | Status: AC
  Filled 2019-10-31: qty 16

## 2019-10-31 MED ORDER — ACETAMINOPHEN 325 MG PO TABS
650.0000 mg | ORAL_TABLET | ORAL | Status: DC | PRN
  Administered 2019-10-31 – 2019-11-04 (×2): 650 mg via ORAL
  Filled 2019-10-31 (×2): qty 2

## 2019-10-31 MED ORDER — ONDANSETRON HCL 4 MG/2ML IJ SOLN
INTRAMUSCULAR | Status: AC
  Filled 2019-10-31: qty 2

## 2019-10-31 MED ORDER — NALOXONE HCL 4 MG/10ML IJ SOLN
1.0000 ug/kg/h | INTRAVENOUS | Status: DC | PRN
  Filled 2019-10-31: qty 5

## 2019-10-31 MED ORDER — KETOROLAC TROMETHAMINE 30 MG/ML IJ SOLN: 30.0000 mg | Freq: Once | INTRAMUSCULAR | Status: DC | PRN

## 2019-10-31 MED ORDER — NALBUPHINE SYRINGE 5 MG/0.5 ML
5.0000 mg | INJECTION | Freq: Once | INTRAMUSCULAR | Status: AC | PRN
  Administered 2019-10-31: 5 mg via INTRAVENOUS
  Filled 2019-10-31 (×2): qty 0.5

## 2019-10-31 MED ORDER — DEXTROSE 5 % IV SOLN
INTRAVENOUS | Status: AC
  Filled 2019-10-31: qty 3000

## 2019-10-31 MED ORDER — MAGNESIUM SULFATE 40 GM/1000ML IV SOLN
2.0000 g/h | INTRAVENOUS | Status: AC
  Administered 2019-11-01: 04:00:00 2 g/h via INTRAVENOUS
  Filled 2019-10-31: qty 1000

## 2019-10-31 MED ORDER — SCOPOLAMINE 1 MG/3DAYS TD PT72: 1.0000 | MEDICATED_PATCH | Freq: Once | TRANSDERMAL | Status: DC

## 2019-10-31 MED ORDER — MORPHINE SULFATE (PF) 0.5 MG/ML IJ SOLN
INTRAMUSCULAR | Status: AC
  Filled 2019-10-31: qty 10

## 2019-10-31 MED ORDER — DEXTROSE 5 % IV SOLN
3.0000 g | INTRAVENOUS | Status: AC
  Administered 2019-10-31: 3 g via INTRAVENOUS

## 2019-10-31 MED ORDER — DIPHENHYDRAMINE HCL 50 MG/ML IJ SOLN
INTRAMUSCULAR | Status: DC | PRN
  Administered 2019-10-31 (×2): 25 mg via INTRAVENOUS

## 2019-10-31 MED ORDER — LABETALOL HCL 5 MG/ML IV SOLN
INTRAVENOUS | Status: AC
  Filled 2019-10-31: qty 8

## 2019-10-31 MED ORDER — SODIUM CHLORIDE 0.9 % IV SOLN
INTRAVENOUS | Status: DC | PRN
  Administered 2019-10-31: 10:00:00 via INTRAVENOUS

## 2019-10-31 MED ORDER — BUPIVACAINE IN DEXTROSE 0.75-8.25 % IT SOLN
INTRATHECAL | Status: DC | PRN
  Administered 2019-10-31: 1.6 mL via INTRATHECAL

## 2019-10-31 MED ORDER — OXYTOCIN 40 UNITS IN NORMAL SALINE INFUSION - SIMPLE MED
INTRAVENOUS | Status: AC
  Filled 2019-10-31: qty 1000

## 2019-10-31 MED ORDER — LABETALOL HCL 5 MG/ML IV SOLN
40.0000 mg | INTRAVENOUS | Status: DC | PRN
  Administered 2019-10-31 – 2019-11-02 (×2): 40 mg via INTRAVENOUS
  Filled 2019-10-31: qty 8

## 2019-10-31 MED ORDER — PROMETHAZINE HCL 25 MG/ML IJ SOLN: 6.2500 mg | INTRAMUSCULAR | Status: DC | PRN

## 2019-10-31 MED ORDER — SOD CITRATE-CITRIC ACID 500-334 MG/5ML PO SOLN
30.0000 mL | ORAL | Status: AC
  Administered 2019-10-31: 30 mL via ORAL
  Filled 2019-10-31: qty 30

## 2019-10-31 MED ORDER — NALBUPHINE SYRINGE 5 MG/0.5 ML
5.0000 mg | INJECTION | INTRAMUSCULAR | Status: DC | PRN
  Administered 2019-11-01: 5 mg via INTRAVENOUS
  Filled 2019-10-31 (×3): qty 0.5

## 2019-10-31 MED ORDER — NALBUPHINE SYRINGE 5 MG/0.5 ML
5.0000 mg | INJECTION | INTRAMUSCULAR | Status: DC | PRN
  Filled 2019-10-31: qty 0.5

## 2019-10-31 MED ORDER — NALBUPHINE SYRINGE 5 MG/0.5 ML
5.0000 mg | INJECTION | Freq: Once | INTRAMUSCULAR | Status: AC | PRN
  Filled 2019-10-31: qty 0.5

## 2019-10-31 MED ORDER — SODIUM CHLORIDE 0.9% FLUSH: 3.0000 mL | INTRAVENOUS | Status: DC | PRN

## 2019-10-31 MED ORDER — DIBUCAINE (PERIANAL) 1 % EX OINT: 1.0000 "application " | TOPICAL_OINTMENT | CUTANEOUS | Status: DC | PRN

## 2019-10-31 MED ORDER — ONDANSETRON HCL 4 MG/2ML IJ SOLN
INTRAMUSCULAR | Status: DC | PRN
  Administered 2019-10-31: 4 mg via INTRAVENOUS

## 2019-10-31 MED ORDER — OXYCODONE HCL 5 MG PO TABS
5.0000 mg | ORAL_TABLET | Freq: Four times a day (QID) | ORAL | Status: DC | PRN
  Administered 2019-11-01 (×2): 5 mg via ORAL
  Administered 2019-11-01 – 2019-11-04 (×7): 10 mg via ORAL
  Administered 2019-11-04: 14:00:00 5 mg via ORAL
  Filled 2019-10-31: qty 1
  Filled 2019-10-31 (×10): qty 2

## 2019-10-31 MED ORDER — DIPHENHYDRAMINE HCL 25 MG PO CAPS: 25.0000 mg | ORAL_CAPSULE | ORAL | Status: DC | PRN

## 2019-10-31 MED ORDER — HYDROMORPHONE HCL 1 MG/ML IJ SOLN: 0.2500 mg | INTRAMUSCULAR | Status: DC | PRN

## 2019-10-31 MED ORDER — LACTATED RINGERS IV SOLN
INTRAVENOUS | Status: DC
  Administered 2019-10-31: 09:00:00 via INTRAVENOUS

## 2019-10-31 MED ORDER — DIPHENHYDRAMINE HCL 50 MG/ML IJ SOLN: 12.5000 mg | INTRAMUSCULAR | Status: DC | PRN

## 2019-10-31 MED ORDER — MENTHOL 3 MG MT LOZG: 1.0000 | LOZENGE | OROMUCOSAL | Status: DC | PRN

## 2019-10-31 MED ORDER — OXYTOCIN 40 UNITS IN NORMAL SALINE INFUSION - SIMPLE MED: 2.5000 [IU]/h | INTRAVENOUS | Status: AC

## 2019-10-31 MED ORDER — WITCH HAZEL-GLYCERIN EX PADS: 1.0000 "application " | MEDICATED_PAD | CUTANEOUS | Status: DC | PRN

## 2019-10-31 MED ORDER — TETANUS-DIPHTH-ACELL PERTUSSIS 5-2.5-18.5 LF-MCG/0.5 IM SUSP: 0.5000 mL | Freq: Once | INTRAMUSCULAR | Status: DC

## 2019-10-31 MED ORDER — IBUPROFEN 600 MG PO TABS
600.0000 mg | ORAL_TABLET | Freq: Four times a day (QID) | ORAL | Status: AC
  Administered 2019-10-31 – 2019-11-03 (×10): 600 mg via ORAL
  Filled 2019-10-31 (×11): qty 1

## 2019-10-31 MED ORDER — LABETALOL HCL 5 MG/ML IV SOLN
80.0000 mg | INTRAVENOUS | Status: DC | PRN
  Administered 2019-10-31: 80 mg via INTRAVENOUS

## 2019-10-31 MED ORDER — OXYCODONE HCL 5 MG PO TABS: 5.0000 mg | ORAL_TABLET | Freq: Once | ORAL | Status: DC | PRN

## 2019-10-31 MED ORDER — LACTATED RINGERS IV SOLN
INTRAVENOUS | Status: AC
  Administered 2019-10-31 – 2019-11-01 (×2): via INTRAVENOUS

## 2019-10-31 MED ORDER — ONDANSETRON HCL 4 MG/2ML IJ SOLN: 4.0000 mg | Freq: Three times a day (TID) | INTRAMUSCULAR | Status: DC | PRN

## 2019-10-31 MED ORDER — LABETALOL HCL 200 MG PO TABS: 200.0000 mg | ORAL_TABLET | Freq: Two times a day (BID) | ORAL | Status: DC

## 2019-10-31 MED ORDER — HYDRALAZINE HCL 20 MG/ML IJ SOLN
INTRAMUSCULAR | Status: AC
  Filled 2019-10-31: qty 1

## 2019-10-31 MED ORDER — DIPHENHYDRAMINE HCL 50 MG/ML IJ SOLN
INTRAMUSCULAR | Status: AC
  Filled 2019-10-31: qty 1

## 2019-10-31 MED ORDER — LABETALOL HCL 5 MG/ML IV SOLN
20.0000 mg | INTRAVENOUS | Status: DC | PRN
  Administered 2019-10-31 – 2019-11-02 (×2): 20 mg via INTRAVENOUS
  Filled 2019-10-31 (×2): qty 4

## 2019-10-31 MED ORDER — SODIUM CHLORIDE 0.9 % IR SOLN
Status: DC | PRN
  Administered 2019-10-31: 1000 mL

## 2019-10-31 MED ORDER — LACTATED RINGERS IV SOLN
INTRAVENOUS | Status: DC | PRN
  Administered 2019-10-31: 10:00:00 via INTRAVENOUS

## 2019-10-31 MED ORDER — LABETALOL HCL 5 MG/ML IV SOLN
INTRAVENOUS | Status: AC
  Filled 2019-10-31: qty 4

## 2019-10-31 MED ORDER — OXYTOCIN 40 UNITS IN NORMAL SALINE INFUSION - SIMPLE MED
INTRAVENOUS | Status: DC | PRN
  Administered 2019-10-31: 40 [IU] via INTRAVENOUS

## 2019-10-31 MED ORDER — SENNOSIDES-DOCUSATE SODIUM 8.6-50 MG PO TABS: 2.0000 | ORAL_TABLET | Freq: Every evening | ORAL | Status: DC | PRN

## 2019-10-31 MED ORDER — POLYETHYLENE GLYCOL 3350 17 G PO PACK
17.0000 g | PACK | Freq: Every day | ORAL | Status: DC
  Administered 2019-11-02 – 2019-11-03 (×2): 17 g via ORAL
  Filled 2019-10-31 (×2): qty 1

## 2019-10-31 MED ORDER — LACTATED RINGERS IV BOLUS
250.0000 mL | Freq: Once | INTRAVENOUS | Status: AC
  Administered 2019-10-31: 250 mL via INTRAVENOUS

## 2019-10-31 MED ORDER — HYDRALAZINE HCL 20 MG/ML IJ SOLN
10.0000 mg | Freq: Once | INTRAMUSCULAR | Status: AC
  Administered 2019-10-31: 10 mg via INTRAVENOUS

## 2019-10-31 MED ORDER — NALOXONE HCL 0.4 MG/ML IJ SOLN: 0.4000 mg | INTRAMUSCULAR | Status: DC | PRN

## 2019-10-31 MED ORDER — PRENATAL MULTIVITAMIN CH
1.0000 | ORAL_TABLET | Freq: Every day | ORAL | Status: DC
  Administered 2019-11-01 – 2019-11-04 (×4): 1 via ORAL
  Filled 2019-10-31 (×4): qty 1

## 2019-10-31 MED ORDER — MORPHINE SULFATE (PF) 0.5 MG/ML IJ SOLN
INTRAMUSCULAR | Status: DC | PRN
  Administered 2019-10-31: .15 mg via INTRATHECAL

## 2019-10-31 MED ORDER — LABETALOL HCL 200 MG PO TABS
600.0000 mg | ORAL_TABLET | Freq: Three times a day (TID) | ORAL | Status: DC
  Administered 2019-10-31: 600 mg via ORAL
  Filled 2019-10-31: qty 3

## 2019-10-31 MED ORDER — ENOXAPARIN SODIUM 60 MG/0.6ML ~~LOC~~ SOLN
0.5000 mg/kg | SUBCUTANEOUS | Status: DC
  Administered 2019-11-01 – 2019-11-04 (×4): 60 mg via SUBCUTANEOUS
  Filled 2019-10-31 (×4): qty 0.6

## 2019-10-31 MED ORDER — MEPERIDINE HCL 25 MG/ML IJ SOLN: 6.2500 mg | INTRAMUSCULAR | Status: DC | PRN

## 2019-10-31 MED ORDER — MAGNESIUM SULFATE BOLUS VIA INFUSION
4.0000 g | Freq: Once | INTRAVENOUS | Status: DC
  Filled 2019-10-31: qty 1000

## 2019-10-31 MED ORDER — OXYCODONE HCL 5 MG/5ML PO SOLN: 5.0000 mg | Freq: Once | ORAL | Status: DC | PRN

## 2019-10-31 MED ORDER — COCONUT OIL OIL: 1.0000 "application " | TOPICAL_OIL | Status: DC | PRN

## 2019-10-31 MED ORDER — LABETALOL HCL 5 MG/ML IV SOLN
40.0000 mg | Freq: Once | INTRAVENOUS | Status: AC
  Administered 2019-10-31: 14:00:00 40 mg via INTRAVENOUS

## 2019-10-31 MED ORDER — DIPHENHYDRAMINE HCL 25 MG PO CAPS: 25.0000 mg | ORAL_CAPSULE | Freq: Four times a day (QID) | ORAL | Status: DC | PRN

## 2019-10-31 MED ORDER — MAGNESIUM SULFATE 40 GM/1000ML IV SOLN
2.0000 g/h | INTRAVENOUS | Status: DC
  Administered 2019-10-31 (×2): 2 g/h via INTRAVENOUS
  Filled 2019-10-31: qty 1000

## 2019-10-31 MED ORDER — SIMETHICONE 80 MG PO CHEW
80.0000 mg | CHEWABLE_TABLET | Freq: Three times a day (TID) | ORAL | Status: DC
  Administered 2019-10-31 – 2019-11-04 (×10): 80 mg via ORAL
  Filled 2019-10-31 (×10): qty 1

## 2019-10-31 SURGICAL SUPPLY — 37 items
APL SKNCLS STERI-STRIP NONHPOA (GAUZE/BANDAGES/DRESSINGS) ×2
BENZOIN TINCTURE PRP APPL 2/3 (GAUZE/BANDAGES/DRESSINGS) ×4 IMPLANT
CANISTER SUCT 3000ML PPV (MISCELLANEOUS) ×4 IMPLANT
CHLORAPREP W/TINT 26ML (MISCELLANEOUS) ×4 IMPLANT
CLOSURE STERI STRIP 1/2 X4 (GAUZE/BANDAGES/DRESSINGS) ×4 IMPLANT
DRSG OPSITE POSTOP 4X10 (GAUZE/BANDAGES/DRESSINGS) ×4 IMPLANT
ELECT REM PT RETURN 9FT ADLT (ELECTROSURGICAL) ×4
ELECTRODE REM PT RTRN 9FT ADLT (ELECTROSURGICAL) ×2 IMPLANT
EXTRACTOR VACUUM KIWI (MISCELLANEOUS) ×4 IMPLANT
GLOVE BIOGEL PI IND STRL 7.0 (GLOVE) ×4 IMPLANT
GLOVE BIOGEL PI IND STRL 7.5 (GLOVE) ×2 IMPLANT
GLOVE BIOGEL PI INDICATOR 7.0 (GLOVE) ×4
GLOVE BIOGEL PI INDICATOR 7.5 (GLOVE) ×2
GLOVE SKINSENSE NS SZ7.0 (GLOVE) ×2
GLOVE SKINSENSE STRL SZ7.0 (GLOVE) ×2 IMPLANT
GOWN STRL REUS W/ TWL LRG LVL3 (GOWN DISPOSABLE) ×4 IMPLANT
GOWN STRL REUS W/ TWL XL LVL3 (GOWN DISPOSABLE) ×2 IMPLANT
GOWN STRL REUS W/TWL LRG LVL3 (GOWN DISPOSABLE) ×8
GOWN STRL REUS W/TWL XL LVL3 (GOWN DISPOSABLE) ×4
NS IRRIG 1000ML POUR BTL (IV SOLUTION) ×4 IMPLANT
PACK C SECTION WH (CUSTOM PROCEDURE TRAY) ×4 IMPLANT
PAD ABD 7.5X8 STRL (GAUZE/BANDAGES/DRESSINGS) ×4 IMPLANT
PAD OB MATERNITY 4.3X12.25 (PERSONAL CARE ITEMS) ×4 IMPLANT
PAD PREP 24X48 CUFFED NSTRL (MISCELLANEOUS) ×4 IMPLANT
PENCIL SMOKE EVAC W/HOLSTER (ELECTROSURGICAL) ×4 IMPLANT
STRIP CLOSURE SKIN 1/2X4 (GAUZE/BANDAGES/DRESSINGS) ×3 IMPLANT
SUT MNCRL 0 VIOLET CTX 36 (SUTURE) ×4 IMPLANT
SUT MON AB 4-0 PS1 27 (SUTURE) ×4 IMPLANT
SUT MONOCRYL 0 CTX 36 (SUTURE) ×4
SUT PLAIN 2 0 (SUTURE) ×4
SUT PLAIN 2 0 XLH (SUTURE) ×4 IMPLANT
SUT PLAIN ABS 2-0 CT1 27XMFL (SUTURE) IMPLANT
SUT VIC AB 0 CT1 36 (SUTURE) ×8 IMPLANT
SUT VIC AB 3-0 CT1 27 (SUTURE) ×4
SUT VIC AB 3-0 CT1 TAPERPNT 27 (SUTURE) ×2 IMPLANT
TOWEL OR 17X24 6PK STRL BLUE (TOWEL DISPOSABLE) ×8 IMPLANT
WATER STERILE IRR 1000ML POUR (IV SOLUTION) ×4 IMPLANT

## 2019-10-31 NOTE — Progress Notes (Signed)
OB Note  I went to check up on patient and placed on EFM (baby had fallen off). EFM shows category II with normal baseline but with minimal variability and had a subtle decel, similar to during the night. SVE FT/long/high and no contractions felt and none on toco. Given this, I told her that I recommend delivery via c-section today given non reassuring fetal status remote from delivery. Pt amenable to plan. She would also like btl. Pt consented. Rpt labs ordered. NPO since prior to MN. Will start Mg and continue for 24hrs PP. OR aware.  Durene Romans MD Attending Center for [employer] (Faculty Practice) 10/31/2019 Time: 0900

## 2019-10-31 NOTE — Op Note (Signed)
Operative Note   SURGERY DATE: 10/31/2019  PRE-OP DIAGNOSIS:  *Pregnancy at 32/4 *Severe pre-eclampsia (BPs, protein, neuro s/s) *Non reassuring fetal status (category II tracing with decelerations) *Remote from delivery *GDMa1 *BMI 68s *Desire for permanent sterilization  POST-OP DIAGNOSIS: Same. Meconium stained fluid   PROCEDURE: primary low transverse cesarean section via pfannenstiel skin incision with double layer uterine closure and bilateral tubal ligation via parkland method  SURGEON: Surgeon(s) and Role:    * Aletha Halim, MD - Primary  ASSISTANT: Barrington Ellison, MD (Fellow)  ANESTHESIA: spinal  ESTIMATED BLOOD LOSS: 661mL  DRAINS: 112mL UOP via indwelling foley  TOTAL IV FLUIDS: 1539mL crystalloid  VTE PROPHYLAXIS: SCDs to bilateral lower extremities  ANTIBIOTICS: Two grams of Cefazolin were given., within 1 hour of skin incision  SPECIMENS: placenta to pathology  COMPLICATIONS: none  FINDINGS: No intra-abdominal adhesions were noted. Grossly normal uterus, tubes and ovaries. Moderate meconium stained amniotic fluid, cephalic,  female infant, weight 1840gm, APGARs 7/8, intact placenta.  PROCEDURE IN DETAIL: The patient was taken to the operating room where anesthesia was administered and normal fetal heart tones were confirmed. She was then prepped and draped in the normal fashion in the dorsal supine position with a leftward tilt.  After a time out was performed, a pfannensteil skin incision was made with the scalpel and carried through to the underlying layer of fascia. The fascia was then incised at the midline and this incision was extended laterally with the mayo scissors. Attention was turned to the superior aspect of the fascial incision which was grasped with the kocher clamps x 2, tented up and the rectus muscles were dissected off with the bovie. In a similar fashion the inferior aspect of the fascial incision was grasped with the kocher clamps, tented  up and the rectus muscles dissected off with the mayo scissors. The rectus muscles were then separated in the midline and the peritoneum was entered bluntly. The bladder blade was inserted and the vesicouterine peritoneum was identified, tented up and entered with the metzenbaum scissors. This incision was extended laterally and the bladder flap was created digitally. The bladder blade was reinserted.  A low transverse hysterotomy was made with the scalpel until the endometrial cavity was breached and the amniotic sac ruptured with the Allis clamp, yielding moderate meconium stained amniotic fluid. This incision was extended bluntly and the infant's head, shoulders and body were delivered atraumatically.The cord was clamped x 2 and cut, and the infant was handed to the awaiting pediatricians, after delayed cord clamping was done because the infant was crying and vigorous immediately upon delivery.  The placenta was then gradually expressed from the uterus and then the uterus was exteriorized and cleared of all clots and debris. The hysterotomy was repaired with a running suture of 1-0 monocryl. A second imbricating layer of 1-0 monocryl suture was then placed to achieve excellent hemostasis.    The left Fallopian tube was identified by tracing out to the fimbraie, grasped with the Babcock clamps. An avascular midsection of the tube approximately 3-4cm from the cornua was grasped with the babcock clamps and the distal and proximal aspects were ligated with a suture of 0 vicryl, with the intervening portion of tube was transected and removed, via the Metzenbaum scissors. Another suture tie was then placed below both stumps.  Attention was then turned to the right fallopian tube after confirmation by tracing the tube out to the fimbriae. The same procedure was then performed on the right Fallopian tube, with  excellent hemostasis was noted from both BTL sites.   The uterus and adnexa were then returned to the  abdomen, and the hysterotomy and all operative sites were reinspected and excellent hemostasis was noted after irrigation and suction of the abdomen with warm saline.  The peritoneum was closed with a running stitch of 3-0 Vicryl. The fascia was reapproximated with 0 Vicryl in a simple running fashion bilaterally. The subcutaneous layer was then reapproximated with interrupted sutures of 2-0 plain gut, and the skin was then closed with 4-0 monocryl, in a subcuticular fashion.  The patient  tolerated the procedure well. Sponge, lap, needle, and instrument counts were correct x 2. The patient was transferred to the recovery room awake, alert and breathing independently in stable condition.  Cornelia Copa MD Attending Center for [facility])

## 2019-10-31 NOTE — Anesthesia Preprocedure Evaluation (Signed)
Anesthesia Evaluation  Patient identified by MRN, date of birth, ID band Patient awake    Reviewed: Allergy & Precautions  History of Anesthesia Complications (+) history of anesthetic complications  Airway Mallampati: II  TM Distance: >3 FB Neck ROM: Full    Dental no notable dental hx. (+) Teeth Intact   Pulmonary neg sleep apnea, neg COPD, former smoker,    Pulmonary exam normal breath sounds clear to auscultation       Cardiovascular negative cardio ROS Normal cardiovascular exam Rhythm:Regular Rate:Normal     Neuro/Psych negative neurological ROS  negative psych ROS   GI/Hepatic negative GI ROS, Neg liver ROS,   Endo/Other  negative endocrine ROS  Renal/GU negative Renal ROS     Musculoskeletal   Abdominal   Peds  Hematology  (+) anemia ,   Anesthesia Other Findings   Reproductive/Obstetrics (+) Pregnancy                             Anesthesia Physical  Anesthesia Plan  ASA: III and emergent  Anesthesia Plan: Spinal   Post-op Pain Management:    Induction:   PONV Risk Score and Plan: Treatment may vary due to age or medical condition  Airway Management Planned: Natural Airway  Additional Equipment: None  Intra-op Plan:   Post-operative Plan:   Informed Consent: I have reviewed the patients History and Physical, chart, labs and discussed the procedure including the risks, benefits and alternatives for the proposed anesthesia with the patient or authorized representative who has indicated his/her understanding and acceptance.     Dental advisory given  Plan Discussed with: Anesthesiologist  Anesthesia Plan Comments:         Anesthesia Quick Evaluation

## 2019-10-31 NOTE — Progress Notes (Signed)
FACULTY PRACTICE ANTEPARTUM(COMPREHENSIVE) NOTE  Jessica Vasquez is a 28 y.o. [id number] at [redacted]w[redacted]d , hospital Day 74,  who is admitted for Preeclampsia with severe features at premature gestation. .   Fetal presentation is cephalic. Pt is on continuous monitoring due to patient perceived decreased FM, and has minimal beat to beat, and a single late at 4:20 a.m BPP and dopplers ordered. Pt being hydrated with LR , is NPO til after doppler/ BPP. Length of Stay:  10  Days  Subjective: Pt denies headache or visual sx at present Patient reports the fetal movement as decreased . Patient reports uterine contraction  activity as none. Patient reports  vaginal bleeding as none. Patient describes fluid per vagina as None.  Vitals:  Blood pressure (!) 145/77, pulse 83, temperature 97.6 F (36.4 C), temperature source Oral, resp. rate 18, height 5\' 10"  (1.778 m), weight 120.1 kg, last menstrual period 03/17/2019, SpO2 100 %, currently breastfeeding.  Physical Examination:  General appearance - alert, well appearing, and in no distress, oriented to person, place, and time, overweight and anxious Heart - normal rate and regular rhythm Abdomen - soft, nontender, nondistended Fundal Height:  size equals dates Cervical Exam: Not evaluated Extremities: extremities normal, atraumatic, no cyanosis or edema and Homans sign is negative, no sign of DVT Membranes:intact  Fetal Monitoring:  Baseline: 135 bpm, Variability: Fair (1-6 bpm), Accelerations: nonreactive, Decelerations: single late at 4 am and currently no decels since 4:20 am  Labs:  Results for orders placed or performed during the hospital encounter of 10/21/19 (from the past 24 hour(s))  Glucose, capillary   Collection Time: 10/30/19  9:19 AM  Result Value Ref Range   Glucose-Capillary 103 (H) 70 - 99 mg/dL  Glucose, capillary   Collection Time: 10/30/19  2:11 PM  Result Value Ref Range   Glucose-Capillary 136 (H) 70 - 99 mg/dL  Glucose, capillary    Collection Time: 10/30/19 11:20 PM  Result Value Ref Range   Glucose-Capillary 160 (H) 70 - 99 mg/dL  Glucose, capillary   Collection Time: 10/31/19  6:04 AM  Result Value Ref Range   Glucose-Capillary 112 (H) 70 - 99 mg/dL    Imaging Studies:    Comments on BPP from Oct 30, 2019:  A biophysical profile performed today was 6 out of 8.  She  received -2 for absent fetal breathing movements.  There was normal amniotic fluid noted on today's ultrasound  exam. ----------------------------------------------------------------------                   Jessica Comings, MD Electronically Signed Final Report   30-Oct-2019 04:50 pm ----------------------------------------------------------------------  Currently EPIC will not allow sonographic studies to automatically populate into notes.  In the meantime, copy and paste results into note or free text.  Medications:  Scheduled . labetalol  600 mg Oral Q8H  . loratadine  10 mg Oral Daily  . prenatal multivitamin  1 tablet Oral Q1200  . sodium chloride flush  3 mL Intravenous Q12H  . valACYclovir  500 mg Oral BID   I have reviewed the patient's current medications.  ASSESSMENT: Patient Active Problem List   Diagnosis Date Noted  . Group beta Strep positive 10/23/2019  . Severe pre-eclampsia 10/21/2019  . Preeclampsia 10/14/2019  . GDM (gestational diabetes mellitus) 10/01/2019  . Gestational hypertension 09/30/2019  . Snoring 08/06/2019  . Alpha thalassemia silent carrier 08/05/2019  . Supervision of other normal pregnancy, antepartum 06/09/2019  . Chronic nasal congestion 05/07/2019  . Nasal polyp 05/07/2019  .  Anemia 10/20/2013  . HSV (herpes simplex virus) infection 10/20/2013    PLAN: NPO IV restarted for fluids BPP/dopplers this a.m.  Jessica Vasquez 10/31/2019,7:14 AM    Patient ID: Jessica Vasquez, female   DOB: [birth year], 28 y.o.   MRN: [id number]

## 2019-10-31 NOTE — Progress Notes (Signed)
Dr. Ilda Basset on the floor. He has reviewed FHR tracing. Toya Smothers, RN

## 2019-10-31 NOTE — Progress Notes (Signed)
Dr. Glo Herring called and made aware of FHR-minimal variability and no accelerations in the last 1.5 hours. Position changes attempted without change. MD to look over strip and said to keep patient on continuous monitoring until evaluation later this morning.

## 2019-10-31 NOTE — Plan of Care (Signed)
  Problem: Education: Goal: Knowledge of condition will improve Outcome: Progressing   Problem: Activity: Goal: Will verbalize the importance of balancing activity with adequate rest periods Outcome: Progressing   Problem: Activity: Goal: Ability to tolerate increased activity will improve Outcome: Progressing   Problem: Coping: Goal: Ability to identify and utilize available resources and services will improve Outcome: Progressing

## 2019-10-31 NOTE — Transfer of Care (Signed)
Immediate Anesthesia Transfer of Care Note  Patient: Jessica Vasquez  Procedure(s) Performed: CESAREAN SECTION (N/A ) BILATERAL TUBAL LIGATION (Bilateral )  Patient Location: PACU  Anesthesia Type:Spinal  Level of Consciousness: awake and alert   Airway & Oxygen Therapy: Patient Spontanous Breathing  Post-op Assessment: Report given to RN and Post -op Vital signs reviewed and stable  Post vital signs: Reviewed  Last Vitals:  Vitals Value Taken Time  BP 143/93 10/31/19 1130  Temp    Pulse 69 10/31/19 1130  Resp 12 10/31/19 1131  SpO2 100 % 10/31/19 1130  Vitals shown include unvalidated device data.  Last Pain:  Vitals:   10/31/19 0748  TempSrc:   PainSc: 0-No pain      Patients Stated Pain Goal: 2 (69/45/03 8882)  Complications: No apparent anesthesia complications

## 2019-10-31 NOTE — Progress Notes (Signed)
4 gram Magnesium bolus infusing. Toya Smothers, RN

## 2019-10-31 NOTE — Progress Notes (Signed)
Pt to OR. Report given to Anesthesia, OR coordinator, and CRNA. NICU is aware. Toya Smothers, RN

## 2019-10-31 NOTE — Anesthesia Postprocedure Evaluation (Signed)
Anesthesia Post Note  Patient: Jessica Vasquez  Procedure(s) Performed: CESAREAN SECTION (N/A ) BILATERAL TUBAL LIGATION (Bilateral )     Patient location during evaluation: PACU Anesthesia Type: Spinal Level of consciousness: oriented and awake and alert Pain management: pain level controlled Vital Signs Assessment: post-procedure vital signs reviewed and stable Respiratory status: spontaneous breathing and respiratory function stable Cardiovascular status: blood pressure returned to baseline and stable Postop Assessment: no headache, no backache and no apparent nausea or vomiting Anesthetic complications: no    Last Vitals:  Vitals:   10/31/19 1315 10/31/19 1323  BP: (!) 176/115 (!) 162/111  Pulse: 66 76  Resp: 14 20  Temp:    SpO2: 100% 100%    Last Pain:  Vitals:   10/31/19 1323  TempSrc:   PainSc: 0-No pain   Pain Goal: Patients Stated Pain Goal: 2 (10/30/19 0321)                 Lynda Rainwater

## 2019-10-31 NOTE — Anesthesia Procedure Notes (Signed)
Spinal  Patient location during procedure: OB Start time: 10/31/2019 9:51 AM End time: 10/31/2019 9:56 AM Staffing Anesthesiologist: Lynda Rainwater, MD Performed: anesthesiologist  Preanesthetic Checklist Completed: patient identified, surgical consent, pre-op evaluation, timeout performed, IV checked, risks and benefits discussed and monitors and equipment checked Spinal Block Patient position: sitting Prep: site prepped and draped and DuraPrep Patient monitoring: heart rate, cardiac monitor, continuous pulse ox and blood pressure Approach: midline Location: L3-4 Injection technique: single-shot Needle Needle type: Pencan  Needle gauge: 24 G Needle length: 10 cm Assessment Sensory level: T4

## 2019-10-31 NOTE — Progress Notes (Signed)
Decision made for c/s delivery. OR prep started. Pt has been NPO since 0700. Toya Smothers RN

## 2019-11-01 LAB — COMPREHENSIVE METABOLIC PANEL
ALT: 18 U/L (ref 0–44)
AST: 29 U/L (ref 15–41)
Albumin: 2 g/dL — ABNORMAL LOW (ref 3.5–5.0)
Alkaline Phosphatase: 82 U/L (ref 38–126)
Anion gap: 9 (ref 5–15)
BUN: 7 mg/dL (ref 6–20)
CO2: 22 mmol/L (ref 22–32)
Calcium: 7.1 mg/dL — ABNORMAL LOW (ref 8.9–10.3)
Chloride: 106 mmol/L (ref 98–111)
Creatinine, Ser: 0.71 mg/dL (ref 0.44–1.00)
GFR calc Af Amer: >60 mL/min (ref >60)
GFR calc non Af Amer: >60 mL/min (ref >60)
Glucose, Bld: 98 mg/dL (ref 70–99)
Potassium: 4.1 mmol/L (ref 3.5–5.1)
Sodium: 137 mmol/L (ref 135–145)
Total Bilirubin: 0.3 mg/dL (ref 0.3–1.2)
Total Protein: 4.9 g/dL — ABNORMAL LOW (ref 6.5–8.1)

## 2019-11-01 LAB — CBC
HCT: 31.6 % — ABNORMAL LOW (ref 36.0–46.0)
Hemoglobin: 10.3 g/dL — ABNORMAL LOW (ref 12.0–15.0)
MCH: 28.5 pg (ref 26.0–34.0)
MCHC: 32.6 g/dL (ref 30.0–36.0)
MCV: 87.5 fL (ref 80.0–100.0)
Platelets: 217 10*3/uL (ref 150–400)
RBC: 3.61 MIL/uL — ABNORMAL LOW (ref 3.87–5.11)
RDW: 14.2 % (ref 11.5–15.5)
WBC: 12.7 10*3/uL — ABNORMAL HIGH (ref 4.0–10.5)
nRBC: 0.2 % (ref 0.0–0.2)

## 2019-11-01 LAB — GLUCOSE, CAPILLARY: Glucose-Capillary: 127 mg/dL — ABNORMAL HIGH (ref 70–99)

## 2019-11-01 MED ORDER — LACTATED RINGERS BOLUS PEDS: 500.0000 mL | Freq: Once | INTRAVENOUS | Status: DC

## 2019-11-01 MED ORDER — LABETALOL HCL 200 MG PO TABS: 200.0000 mg | ORAL_TABLET | Freq: Two times a day (BID) | ORAL | Status: DC

## 2019-11-01 MED ORDER — LACTATED RINGERS IV BOLUS: 500.0000 mL | Freq: Once | INTRAVENOUS | Status: DC

## 2019-11-01 MED ORDER — LABETALOL HCL 200 MG PO TABS
200.0000 mg | ORAL_TABLET | Freq: Two times a day (BID) | ORAL | Status: DC
  Administered 2019-11-01: 200 mg via ORAL
  Filled 2019-11-01 (×2): qty 1

## 2019-11-01 NOTE — Progress Notes (Signed)
Dr. Roselie Awkward notified of Patient reporting dizziness and an overall feeling of not "feeling well" . BP 99/67 HR 78 R 16 99% on room air. Instructed to give patient 500cc bolus of LR and to check a blood sugar. BS 127. 500 cc bolus in progress. Toya Smothers, RN

## 2019-11-01 NOTE — Lactation Note (Signed)
This note was copied from a baby's chart. Lactation Consultation Note  Patient Name: Jessica Vasquez BULAG'T Date: 11/01/2019  Attempt to see times two.  Mom in NICU.  DEBP at bedside.  RN reports she will call lactation when mom returns.   Maternal Data    Feeding Feeding Type: Donor Breast Milk  LATCH Score                   Interventions    Lactation Tools Discussed/Used     Consult Status      Jessica Vasquez 11/01/2019, 6:08 PM

## 2019-11-01 NOTE — Progress Notes (Addendum)
Daily Obstetrics Note  Admission Date: 10/21/2019 Current Date: 11/01/2019 7:15 AM  Jessica Vasquez is a 28 y.o. [id number] POD#1 pLTCS for NRFHT remote from delivery @ 32/3 HD#11, admitted for severe pre-eclampsia (HA, visual s/s).  Pregnancy complicated by: Patient Active Problem List   Diagnosis Date Noted  . Non-reassuring fetal heart tones complicating pregnancy, antepartum 10/31/2019  . [redacted] weeks gestation of pregnancy 10/31/2019  . Group beta Strep positive 10/23/2019  . Severe pre-eclampsia 10/21/2019  . Preeclampsia 10/14/2019  . GDM (gestational diabetes mellitus) 10/01/2019  . Gestational hypertension 09/30/2019  . Snoring 08/06/2019  . Alpha thalassemia silent carrier 08/05/2019  . Supervision of other normal pregnancy, antepartum 06/09/2019  . Chronic nasal congestion 05/07/2019  . Nasal polyp 05/07/2019  . Anemia 10/20/2013  . HSV (herpes simplex virus) infection 10/20/2013    Overnight/24hr events:  Had to get several doses of IV meds in PACU to control BPs.   Subjective:  No s/s of pre-eclampsia. No flatus or ambulation yet. Foley still in place. Pain controlled with po meds.   Objective:    Current Vital Signs 24h Vital Sign Ranges  T 97.7 F (36.5 C) Temp  Avg: 97.8 F (36.6 C)  Min: 97.1 F (36.2 C)  Max: 98.8 F (37.1 C)  BP 122/65 BP  Min: 120/78  Max: 187/116  HR 89 Pulse  Avg: 74  Min: 64  Max: 115  RR 18 Resp  Avg: 16.6  Min: 10  Max: 24  SaO2 100 % Room Air SpO2  Avg: 99.4 %  Min: 98 %  Max: 100 %       24 Hour I/O Current Shift I/O  Time Ins Outs 11/14 0701 - 11/15 0700 In: 3830.5 [P.O.:1450; I.V.:2380.5] Out: 7261 [Urine:6500] No intake/output data recorded.   Patient Vitals for the past 24 hrs:  BP Temp Temp src Pulse Resp SpO2  11/01/19 0316 122/65 97.7 F (36.5 C) Oral 89 18 100 %  10/31/19 2359 120/78 97.9 F (36.6 C) Oral 92 17 100 %  10/31/19 2256 134/82 98.1 F (36.7 C) Oral 96 18 100 %  10/31/19 2012 127/66 (!) 97.4 F (36.3 C)  Oral 77 17 -  10/31/19 1730 127/67 - - 71 18 98 %  10/31/19 1700 - - - - - 99 %  10/31/19 1643 (!) 135/103 - - (!) 115 18 98 %  10/31/19 1633 (!) 151/100 - - 72 20 99 %  10/31/19 1630 (!) 163/91 - - 66 - 100 %  10/31/19 1535 (!) 158/90 (!) 97.5 F (36.4 C) Oral 68 18 99 %  10/31/19 1530 - - - - - 99 %  10/31/19 1525 - - - - - 100 %  10/31/19 1520 - - - - - 100 %  10/31/19 1515 - - - - - 99 %  10/31/19 1510 - - - - - 99 %  10/31/19 1505 - - - - - 99 %  10/31/19 1500 - - - - - 99 %  10/31/19 1440 (!) 152/82 98.5 F (36.9 C) Oral 73 20 -  10/31/19 1435 (!) 152/82 - - 74 - -  10/31/19 1425 - - - 71 13 99 %  10/31/19 1420 - - - 72 17 99 %  10/31/19 1415 (!) 156/94 - - 73 (!) 23 100 %  10/31/19 1410 - - - 72 (!) 22 98 %  10/31/19 1405 - - - 71 13 100 %  10/31/19 1400 (!) 161/103 98.8 F (37.1 C) Axillary  67 16 99 %  10/31/19 1355 - - - 76 15 99 %  10/31/19 1350 - - - 69 15 99 %  10/31/19 1345 - - - 71 (!) 21 100 %  10/31/19 1342 (!) 161/113 - - - - -  10/31/19 1340 - - - 72 10 100 %  10/31/19 1336 (!) 161/113 - - 75 14 100 %  10/31/19 1335 - - - 72 14 98 %  10/31/19 1330 - 97.8 F (36.6 C) Axillary 70 19 99 %  10/31/19 1325 - - - 70 13 98 %  10/31/19 1323 (!) 162/111 - - 76 20 100 %  10/31/19 1320 - - - 74 13 100 %  10/31/19 1315 (!) 176/115 - - 66 14 100 %  10/31/19 1310 (!) 187/116 - - 68 14 100 %  10/31/19 1305 - - - 70 15 100 %  10/31/19 1300 (!) 167/116 97.8 F (36.6 C) Axillary 70 15 100 %  10/31/19 1245 (!) 179/110 - - 64 18 100 %  10/31/19 1228 (!) 172/120 (!) 97.1 F (36.2 C) Axillary 68 (!) 24 100 %  10/31/19 1215 (!) 160/94 - - 74 16 100 %  10/31/19 1200 (!) 159/96 (!) 97.1 F (36.2 C) Oral 72 14 100 %  10/31/19 1145 (!) 149/100 - - 64 15 100 %  10/31/19 1129 (!) 143/93 97.7 F (36.5 C) Oral 68 13 100 %  10/31/19 0725 (!) 150/87 97.7 F (36.5 C) Oral 85 17 100 %   UOP >144mL/hr  Physical exam: General: Well nourished, well developed female in no acute  distress. Abdomen: obese, mildly distended, nttp. C/d/i dressing Cardiovascular: S1, S2 normal, no murmur, rub or gallop, regular rate and rhythm Respiratory: CTAB Extremities: no clubbing, cyanosis or edema Skin: Warm and dry.   Medications: Current Facility-Administered Medications  Medication Dose Route Frequency Provider Last Rate Last Dose  . acetaminophen (TYLENOL) tablet 650 mg  650 mg Oral Q4H PRN [place] Bing, MD   650 mg at 10/31/19 2009  . coconut oil  1 application Topical PRN [place] Bing, MD      . witch hazel-glycerin (TUCKS) pad 1 application  1 application Topical PRN [place] Bing, MD       And  . dibucaine (NUPERCAINAL) 1 % rectal ointment 1 application  1 application Rectal PRN [place] Bing, MD      . diphenhydrAMINE (BENADRYL) injection 12.5 mg  12.5 mg Intravenous Q4H PRN Lowella Curb, MD       Or  . diphenhydrAMINE (BENADRYL) capsule 25 mg  25 mg Oral Q4H PRN Lowella Curb, MD      . diphenhydrAMINE (BENADRYL) capsule 25 mg  25 mg Oral Q6H PRN [place] Bing, MD      . enoxaparin (LOVENOX) injection 60 mg  0.5 mg/kg Subcutaneous Q24H [place] Bing, MD      . famotidine (PEPCID) tablet 20 mg  20 mg Oral BID PRN [place] Bing, MD   20 mg at 10/31/19 0924  . ibuprofen (ADVIL) tablet 600 mg  600 mg Oral Q6H [place] Bing, MD   600 mg at 11/01/19 1443  . labetalol (NORMODYNE) injection 20 mg  20 mg Intravenous PRN [place] Bing, MD   20 mg at 10/31/19 1233   And  . labetalol (NORMODYNE) injection 40 mg  40 mg Intravenous PRN  Bing, MD   40 mg at 10/31/19 1258   And  . labetalol (NORMODYNE) injection 80 mg  80 mg Intravenous PRN Tyneisha Hegeman,  Ruther Ephraim, MD   80 mg at 10/31/19 1315  . labetalol (NORMODYNE) tablet 600 mg  600 mg Oral Q8H [place] Bing, MD   600 mg at 10/31/19 2140  . lactated ringers infusion   Intravenous Continuous [place] Bing, MD 75 mL/hr at 11/01/19 0400    . magnesium sulfate 40 grams in SWI 1000 mL  OB infusion  2 g/hr Intravenous Continuous [place] Bing, MD 50 mL/hr at 11/01/19 0359 2 g/hr at 11/01/19 0359  . menthol-cetylpyridinium (CEPACOL) lozenge 3 mg  1 lozenge Oral Q2H PRN [place] Bing, MD      . nalbuphine (NUBAIN) injection 5 mg  5 mg Intravenous Q4H PRN Lowella Curb, MD   5 mg at 11/01/19 0416   Or  . nalbuphine (NUBAIN) injection 5 mg  5 mg Subcutaneous Q4H PRN Lowella Curb, MD      . naloxone [facility]) injection 0.4 mg  0.4 mg Intravenous PRN Lowella Curb, MD       And  . sodium chloride flush (NS) 0.9 % injection 3 mL  3 mL Intravenous PRN Lowella Curb, MD      . naloxone HCl [facility]) 2 mg in dextrose 5 % 250 mL infusion  1-4 mcg/kg/hr Intravenous Continuous PRN Lowella Curb, MD      . ondansetron [facility]) injection 4 mg  4 mg Intravenous Q8H PRN Lowella Curb, MD      . oxyCODONE (Oxy IR/ROXICODONE) immediate release tablet 5-10 mg  5-10 mg Oral Q6H PRN [place] Bing, MD   5 mg at 11/01/19 0413  . polyethylene glycol (MIRALAX / GLYCOLAX) packet 17 g  17 g Oral Daily [place] Bing, MD      . prenatal multivitamin tablet 1 tablet  1 tablet Oral Q1200 [place] Bing, MD      . scopolamine (TRANSDERM-SCOP) 1 MG/3DAYS 1.5 mg  1 patch Transdermal Once Lowella Curb, MD      . senna-docusate (Senokot-S) tablet 2 tablet  2 tablet Oral QHS PRN [place] Bing, MD      . simethicone (MYLICON) chewable tablet 80 mg  80 mg Oral TID Ilean Skill, MD   80 mg at 10/31/19 1719  . Tdap (BOOSTRIX) injection 0.5 mL  0.5 mL Intramuscular Once [place] Bing, MD        Labs:  Recent Labs  Lab 10/28/19 0723 10/31/19 0826 11/01/19 0537  WBC 9.8 10.8* 12.7*  HGB 10.5* 10.3* 10.3*  HCT 32.5* 31.2* 31.6*  PLT 171 193 217    Recent Labs  Lab 10/28/19 0723 10/31/19 0826 11/01/19 0537  NA 136 134* 137  K 3.8 4.0 4.1  CL 106 106 106  CO2 19* 18* 22  BUN CREATININE 0.67 0.75 0.71  CALCIUM 8.9 8.4* 7.1*  PROT 5.2*  5.3* 4.9*  BILITOT 0.3 0.2* 0.3  ALKPHOS 95 86 82  ALT AST GLUCOSE 87 110* 98    Radiology:  No new imaging  Assessment & Plan:  Pt stable *PP/postop: routine care. AB POS. S/p BTL. Remove foley, f/u void *Severe pre-x: rpt labs negative. Mg off later this morning. Labetalol at 600 q8h before surgery and she didn't get her am dose prior to c-section yesterday. Bps a lot better this morning. So will watch carefully and do 200 bid.  *Pulm: continue cpap *GDMa1: 2h testing at PP visit *PPx: SCDs *FEN/GI: regular diet. mivf while on mg.  *Dispo: likely post  op day 3  Durene Romans. MD Attending Center for [facility])

## 2019-11-02 ENCOUNTER — Ambulatory Visit (HOSPITAL_COMMUNITY): Payer: BC Managed Care – PPO

## 2019-11-02 ENCOUNTER — Telehealth: Payer: Self-pay

## 2019-11-02 MED ORDER — ENALAPRIL MALEATE 5 MG PO TABS
10.0000 mg | ORAL_TABLET | Freq: Every day | ORAL | Status: DC
  Administered 2019-11-02: 10:00:00 10 mg via ORAL
  Filled 2019-11-02: qty 2

## 2019-11-02 MED ORDER — ZOLPIDEM TARTRATE 5 MG PO TABS
5.0000 mg | ORAL_TABLET | Freq: Every evening | ORAL | Status: DC | PRN
  Administered 2019-11-02: 01:00:00 5 mg via ORAL
  Filled 2019-11-02: qty 1

## 2019-11-02 NOTE — Progress Notes (Signed)
Pt to NICU to see baby. Offered pt 6am meds,  pt declines.. Pt states,"will likely return to unit aorund 8am.". Alveda Reasons RN made aware    Gilmer Mor RN

## 2019-11-02 NOTE — Lactation Note (Signed)
This note was copied from a baby's chart. Lactation Consultation Note  Patient Name: Jessica Vasquez NOTRR'N Date: 11/02/2019 Reason for consult: Initial assessment;Preterm <34wks;NICU baby;Infant < 6lbs Baby is 68 hours old  [place] met mom in her room, reviewed basics , hand expressing and the DEBP.  The #24 F was a good fit for both nipples and per mom comfortable.  LC explained to mom when her milk comes in she may need to increase to the #27 is breast are  Really full. Mom was able to demo back hand expressing with drops.  Sore nipple and engorgement prevention and tx reviewed.  Per mom needs to call her insurance company ( [place] / [employer] ) for her DEBP.  LC recommended to inform the insurance company her baby is in NICU .  LC also recommended planning on pumping in NICU when visiting the baby.  Storage of breast milk reviewed in NICU booklet.  And pamphlet with phone numbers provided.   Maternal Data Has patient been taught Hand Expression?: Yes Does the patient have breastfeeding experience prior to this delivery?: Yes  Feeding Feeding Type: Donor Breast Milk  LATCH Score                   Interventions Interventions: Breast feeding basics reviewed;DEBP  Lactation Tools Discussed/Used Tools: Pump;Flanges Flange Size: 24(Good fit , per mom  comfortable) Breast pump type: Double-Electric Breast Pump WIC Program: No Pump Review: Milk Storage Initiated by:: MAI Date initiated:: 11/02/19   Consult Status Consult Status: Follow-up Date: 11/03/19 Follow-up type: In-patient    [place] 11/02/2019, 10:28 AM

## 2019-11-02 NOTE — Telephone Encounter (Signed)
TC from [place] that she will be assisting with patient for nursing support  Her contact number [phone]

## 2019-11-02 NOTE — Clinical Social Work Maternal (Signed)
CLINICAL SOCIAL WORK MATERNAL/CHILD NOTE  Patient Details  Name: Jessica Vasquez MRN: [id number] Date of Birth: November 10, 1991  Date:  11/02/2019  Clinical Social Worker Initiating Note:  Laurey Arrow Date/Time: Initiated:  11/02/19/1403     Child's Name:  Jessica Vasquez   Biological Parents:  Mother   Need for Interpreter:  None   Reason for Referral:  Other (Comment)(Edinburgh Score of 14)   Address:  [address] [address]    Phone number:  [phone] (home)     Additional phone number:   Household Members/Support Persons (HM/SP):   Household Member/Support Person 1, Household Member/Support Person 2(MOB reports that she and her older 2 children lives with her grandmother.)   HM/SP Name Relationship DOB or Age  HM/SP -1 Jessica Vasquez daughter 04/09/2012  HM/SP -2 Jessica Vasquez son 04/27/2014  HM/SP -3        HM/SP -4        HM/SP -5        HM/SP -6        HM/SP -7        HM/SP -8          Natural Supports (not living in the home):  Parent, Immediate Family, Extended Family   Professional Supports: None   Employment: Full-time   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   Education:      Homebound arranged:    Financial Resources:  Medicaid, Multimedia programmer   Other Resources:  (MOB reports that she plans to apply for [place].  CSW provided MBO with WIC's information.)   Cultural/Religious Considerations Which May Impact Care:  None reported  Strengths:  Ability to meet basic needs , Pediatrician chosen, Home prepared for child    Psychotropic Medications:         Pediatrician:    [place]  Pediatrician List:   [place] [place], [place]      Pediatrician Fax Number:    Risk Factors/Current Problems:  None   Cognitive State:  Able to Concentrate , Alert , Insightful , Linear Thinking , Goal Oriented     Mood/Affect:  Happy , Bright , Interested , Relaxed    CSW Assessment: CSW met with MOB at infant's bedside (room 322) to complete an assessment for Edinburgh score of 14.  When CSW arrived, MOB was observing bedside nurse care for infant in her isolette.  CSW explained CSW's role and MOB gave CSW permission to complete the assessment while MOB was visiting with infant. MOB was polite, easy to engage, and was receptive to meeting with CSW.   CSW reviewed MOB's Edinburgh score and MOB laughed and communicated, "I feel like everyone should have a hight score on the assessment with COVID and especially if the mother has had any type of pregnancy problems like I did."  CSW validated and normalized MOB's response and thanked MOB for being honest while taking the [place].  MOB denied having PPD with MOB's older 2 children.  CSW provided education regarding Baby Blues vs PMADs and provided MOB with resources for mental health follow up.  CSW encouraged MOB to evaluate her mental health throughout the postpartum period with the use of the New Mom Checklist developed by Postpartum Progress as well as the [place] Postnatal Depression Scale and notify a medical professional if symptoms arise.  MOB did  not present with any acute MH symptoms and reported feeling comfortable seeking help if warranted.  CSW was also open to learning about other emotions that MOB may experience during the postpartum period.  MOB reported that FOB will not be involved (per MOB that is not a stressor) however she has the support of her immediate and extended family members. MOB declined resources for outpatient counseling.  When assessed for safety MOB denied SI, HI, and DV.  MOB communicated having all essential items for infant and feeling prepared to parent. CSW will continue to offer resources and supports to MOB while infant remains in NICU.    CSW Plan/Description:  Psychosocial Support and Ongoing Assessment of Needs,  Sudden Infant Death Syndrome (SIDS) Education, Perinatal Mood and Anxiety Disorder (PMADs) Education, Other Patient/Family Education, Other Information/Referral to [employer], MSW, [employer] Clinical Social Work [phone]   Dimple Nanas, LCSW 11/02/2019, 2:09 PM

## 2019-11-02 NOTE — Progress Notes (Signed)
Subjective:had episode of dizziness yesterday, feels better now Postpartum Day 2: Cesarean Delivery Patient reports tolerating PO and no problems voiding.    Objective: Vital signs in last 24 hours: Temp:  [97.6 F (36.4 C)-98.5 F (36.9 C)] 98.5 F (36.9 C) (11/16 0500) Pulse Rate:  [72-86] 76 (11/16 0500) Resp:  [17-18] 17 (11/16 0500) BP: (99-157)/(64-95) 157/95 (11/16 0500) SpO2:  [98 %-100 %] 99 % (11/16 0500)  Physical Exam:  General: alert, cooperative and no distress Lochia: appropriate Uterine Fundus: firm Incision: no significant drainage DVT Evaluation: No evidence of DVT seen on physical exam.  Recent Labs    10/31/19 0826 11/01/19 0537  HGB 10.3* 10.3*  HCT 31.2* 31.6*    Assessment/Plan: Status post Cesarean section. Postoperative course complicated by severe preelampsia  Continue current care.  [name] 11/02/2019, 6:51 AM

## 2019-11-03 LAB — SURGICAL PATHOLOGY

## 2019-11-03 MED ORDER — ENALAPRIL MALEATE 5 MG PO TABS
15.0000 mg | ORAL_TABLET | Freq: Every day | ORAL | Status: DC
  Administered 2019-11-03 – 2019-11-04 (×2): 15 mg via ORAL
  Filled 2019-11-03 (×2): qty 3

## 2019-11-03 MED ORDER — LORATADINE 10 MG PO TABS
10.0000 mg | ORAL_TABLET | Freq: Every day | ORAL | Status: DC
  Administered 2019-11-03 – 2019-11-04 (×2): 10 mg via ORAL
  Filled 2019-11-03 (×2): qty 1

## 2019-11-03 NOTE — Lactation Note (Signed)
This note was copied from a baby's chart. Lactation Consultation Note  Patient Name: Girl Renesme Kerrigan [id number] Date: 11/03/2019 Reason for consult: Follow-up assessment   P3 mother whose infant is now 17 hours old.  This is a preterm infant at 32+4 weeks weighing < 6 lbs and in the NICU.  Mother breast fed her first child for 6 months and her second child for 9 months.  Mother was attempting to sleep when I arrived.  She had no questions regarding pumping.  Mother denies pain with pumping.  Reminded her to continue pumping every three hours and to perform hand expression before/after pumping to help increase milk supply.  Mother is able to express drops and stated she took some colostrum to the NICU yesterday.  Mother has pumped at baby's bedside which I encouraged.  She will obtain a DEBP from her insurance company.  She knows to do this as soon as possible.  We may need to reevaluate the need for a DEBP at discharge if she does not acquire her pump by discharge date or have a pump available.       Consult Status Consult Status: Follow-up Date: 11/04/19 Follow-up type: In-patient    Mikyle Sox R Makayla Lanter 11/03/2019, 9:26 AM

## 2019-11-03 NOTE — Progress Notes (Signed)
Postpartum Day 3: Cesarean Delivery Subjective: Patient had episode of severe range BP yesterday evening, had to receive Labetalol 20 mg and 40 mg. Was started on Enalapril 10 mg yesterday morning.  Patient denies any headaches, visual symptoms, RUQ/epigastric pain or other concerning symptoms. Patient reports tolerating PO and no problems voiding.  + flatus, no BM.  Baby doing well in NICU.   Objective: Vital signs in last 24 hours: Temp:  [97.9 F (36.6 C)-98.4 F (36.9 C)] 98.3 F (36.8 C) (11/17 0851) Pulse Rate:  [75-94] 89 (11/17 0851) Resp:  [18-20] 18 (11/17 0851) BP: (135-184)/(81-113) 140/87 (11/17 0851) SpO2:  [99 %-100 %] 100 % (11/17 0851) Patient Vitals for the past 24 hrs:  BP Temp Temp src Pulse Resp SpO2  11/03/19 0851 140/87 98.3 F (36.8 C) Oral 89 18 100 %  11/03/19 0525 - - - - - 99 %  11/03/19 0524 135/89 98.3 F (36.8 C) Oral 90 20 -  11/03/19 0022 (!) 155/87 98.4 F (36.9 C) Oral 87 20 99 %  11/02/19 2042 (!) 145/95 - - 94 - -  11/02/19 1940 - 98.2 F (36.8 C) Oral - 18 100 %  11/02/19 1901 (!) 144/86 - - 90 - -  11/02/19 1851 (!) 144/81 - - 86 - -  11/02/19 1841 (!) 150/91 - - 88 - -  11/02/19 1831 (!) 154/90 - - 87 - -  11/02/19 1824 (!) 147/89 - - 88 18 -  11/02/19 1810 135/87 - - 88 - -  11/02/19 1809 137/83 - - 89 - -  11/02/19 1801 (!) 162/94 - - 82 - -  11/02/19 1751 (!) 158/92 - - 80 - -  11/02/19 1741 (!) 156/100 - - 80 - -  11/02/19 1731 (!) 159/96 - - 75 - -  11/02/19 1720 (!) 158/91 - - 86 - -  11/02/19 1711 (!) 184/99 - - 86 - -  11/02/19 1654 (!) 166/106 - - 83 - -  11/02/19 1652 (!) 153/113 97.9 F (36.6 C) Oral 85 18 99 %    Physical Exam:  General: alert, cooperative and no distress Lochia: appropriate Uterine Fundus: firm Incision: no significant drainage, old honeycomb is off (will be replaced soon by RN) DVT Evaluation: No evidence of DVT seen on physical exam.  Labs: CMP Latest Ref Rng & Units 11/01/2019 10/31/2019  10/28/2019  Glucose 70 - 99 mg/dL 98 110(H) 87  BUN 6 - 20 mg/dL 7 10 7   Creatinine 0.44 - 1.00 mg/dL 0.71 0.75 0.67  Sodium 135 - 145 mmol/L 137 134(L) 136  Potassium 3.5 - 5.1 mmol/L 4.1 4.0 3.8  Chloride 98 - 111 mmol/L 106 106 106  CO2 22 - 32 mmol/L 22 18(L) 19(L)  Calcium 8.9 - 10.3 mg/dL 7.1(L) 8.4(L) 8.9  Total Protein 6.5 - 8.1 g/dL 4.9(L) 5.3(L) 5.2(L)  Total Bilirubin 0.3 - 1.2 mg/dL 0.3 0.2(L) 0.3  Alkaline Phos 38 - 126 U/L 82 86 95  AST 15 - 41 U/L 29 24 22   ALT 0 - 44 U/L 18 19 17    CBC Latest Ref Rng & Units 11/01/2019 10/31/2019 10/28/2019  WBC 4.0 - 10.5 K/uL 12.7(H) 10.8(H) 9.8  Hemoglobin 12.0 - 15.0 g/dL 10.3(L) 10.3(L) 10.5(L)  Hematocrit 36.0 - 46.0 % 31.6(L) 31.2(L) 32.5(L)  Platelets 150 - 400 K/uL 217 193 171   Assessment/Plan: Status post Cesarean section for NRFHT remote from delivery at [redacted]w[redacted]d in the setting of severe preelampsia  - Enalapril increased to 15  mg daily, will observe BP today. If stable, will consider discharge tomorrow. Discussed management of preeclampsia in detail with patient and her mother (who was on the phone), explained that she will still need close outpatient follow up and may need further medication adjustments postpartum. They were under the impression that delivery "cures the preeclampsia"; I explained that BP disorders such as preeclampsia can still persist postpartum or even occur postpartum. However, delivery starts the process of treatment and also prevents associated fetal morbidity and mortality.  They expressed understanding of this.  -Continue routine care   Jaynie Collins, MD 11/03/2019, 9:41 AM

## 2019-11-04 DIAGNOSIS — Z98891 History of uterine scar from previous surgery: Secondary | ICD-10-CM

## 2019-11-04 MED ORDER — ENALAPRIL MALEATE 20 MG PO TABS: 20.0000 mg | ORAL_TABLET | Freq: Every day | ORAL | 0 refills | Status: DC

## 2019-11-04 MED ORDER — SENNOSIDES-DOCUSATE SODIUM 8.6-50 MG PO TABS: 2.0000 | ORAL_TABLET | Freq: Two times a day (BID) | ORAL | 2 refills | Status: DC | PRN

## 2019-11-04 MED ORDER — OXYCODONE HCL 5 MG PO TABS: 5.0000 mg | ORAL_TABLET | Freq: Four times a day (QID) | ORAL | 0 refills | Status: DC | PRN

## 2019-11-04 MED ORDER — IBUPROFEN 600 MG PO TABS: 600.0000 mg | ORAL_TABLET | Freq: Four times a day (QID) | ORAL | 2 refills | Status: DC | PRN

## 2019-11-04 MED FILL — SENNA S 8.6-50 MG TABS: 8.6-50 | 8 days supply | Qty: 30 | Fill #0

## 2019-11-04 MED FILL — ENALAPRIL MALEATE 20 MG TAB: 20 | 30 days supply | Qty: 30 | Fill #0

## 2019-11-04 MED FILL — oxyCODONE HCL 5 MG TABS: 5 | 5 days supply | Qty: 30 | Fill #0

## 2019-11-04 MED FILL — IBUPROFEN 600 MG TABLET: 600 | 5 days supply | Qty: 30 | Fill #0

## 2019-11-04 NOTE — Discharge Instructions (Signed)
Cesarean Delivery, Care After This sheet gives you information about how to care for yourself after your procedure. Your health care provider may also give you more specific instructions. If you have problems or questions, contact your health care provider. What can I expect after the procedure? After the procedure, it is common to have:  A small amount of blood or clear fluid coming from the incision.  Some redness, swelling, and pain in your incision area.  Some abdominal pain and soreness.  Vaginal bleeding (lochia). Even though you did not have a vaginal delivery, you will still have vaginal bleeding and discharge.  Pelvic cramps.  Fatigue. You may have pain, swelling, and discomfort in the tissue between your vagina and your anus (perineum) if:  Your C-section was unplanned, and you were allowed to labor and push.  An incision was made in the area (episiotomy) or the tissue tore during attempted vaginal delivery. Follow these instructions at home: Incision care   Follow instructions from your health care provider about how to take care of your incision. Make sure you: ? Wash your hands with soap and water before you change your bandage (dressing). If soap and water are not available, use hand sanitizer. ? If you have a dressing, change it or remove it as told by your health care provider. ? Leave stitches (sutures), skin staples, skin glue, or adhesive strips in place. These skin closures may need to stay in place for 2 weeks or longer. If adhesive strip edges start to loosen and curl up, you may trim the loose edges. Do not remove adhesive strips completely unless your health care provider tells you to do that.  Check your incision area every day for signs of infection. Check for: ? More redness, swelling, or pain. ? More fluid or blood. ? Warmth. ? Pus or a bad smell.  Do not take baths, swim, or use a hot tub until your health care provider says it's okay. Ask your health  care provider if you can take showers.  When you cough or sneeze, hug a pillow. This helps with pain and decreases the chance of your incision opening up (dehiscing). Do this until your incision heals. Medicines  Take over-the-counter and prescription medicines only as told by your health care provider.  If you were prescribed an antibiotic medicine, take it as told by your health care provider. Do not stop taking the antibiotic even if you start to feel better.  Do not drive or use heavy machinery while taking prescription pain medicine. Lifestyle  Do not drink alcohol. This is especially important if you are breastfeeding or taking pain medicine.  Do not use any products that contain nicotine or tobacco, such as cigarettes, e-cigarettes, and chewing tobacco. If you need help quitting, ask your health care provider. Eating and drinking  Drink at least 8 eight-ounce glasses of water every day unless told not to by your health care provider. If you breastfeed, you may need to drink even more water.  Eat high-fiber foods every day. These foods may help prevent or relieve constipation. High-fiber foods include: ? Whole grain cereals and breads. ? Brown rice. ? Beans. ? Fresh fruits and vegetables. Activity   If possible, have someone help you care for your baby and help with household activities for at least a few days after you leave the hospital.  Return to your normal activities as told by your health care provider. Ask your health care provider what activities are safe for  you.  Rest as much as possible. Try to rest or take a nap while your baby is sleeping.  Do not lift anything that is heavier than 10 lbs (4.5 kg), or the limit that you were told, until your health care provider says that it is safe.  Talk with your health care provider about when you can engage in sexual activity. This may depend on your: ? Risk of infection. ? How fast you heal. ? Comfort and desire to  engage in sexual activity. General instructions  Do not use tampons or douches until your health care provider approves.  Wear loose, comfortable clothing and a supportive and well-fitting bra.  Keep your perineum clean and dry. Wipe from front to back when you use the toilet.  If you pass a blood clot, save it and call your health care provider to discuss. Do not flush blood clots down the toilet before you get instructions from your health care provider.  Keep all follow-up visits for you and your baby as told by your health care provider. This is important. Contact a health care provider if:  You have: ? A fever. ? Bad-smelling vaginal discharge. ? Pus or a bad smell coming from your incision. ? Difficulty or pain when urinating. ? A sudden increase or decrease in the frequency of your bowel movements. ? More redness, swelling, or pain around your incision. ? More fluid or blood coming from your incision. ? A rash. ? Nausea. ? Little or no interest in activities you used to enjoy. ? Questions about caring for yourself or your baby.  Your incision feels warm to the touch.  Your breasts turn red or become painful or hard.  You feel unusually sad or worried.  You vomit.  You pass a blood clot from your vagina.  You urinate more than usual.  You are dizzy or light-headed. Get help right away if:  You have: ? Pain that does not go away or get better with medicine. ? Chest pain. ? Difficulty breathing. ? Blurred vision or spots in your vision. ? Thoughts about hurting yourself or your baby. ? New pain in your abdomen or in one of your legs. ? A severe headache.  You faint.  You bleed from your vagina so much that you fill more than one sanitary pad in one hour. Bleeding should not be heavier than your heaviest period. Summary  After the procedure, it is common to have pain at your incision site, abdominal cramping, and slight bleeding from your vagina.  Check  your incision area every day for signs of infection.  Tell your health care provider about any unusual symptoms.  Keep all follow-up visits for you and your baby as told by your health care provider. This information is not intended to replace advice given to you by your health care provider. Make sure you discuss any questions you have with your health care provider. Document Released: 08/25/2002 Document Revised: 06/11/2018 Document Reviewed: 06/11/2018 Elsevier Patient Education  2020 [employer].    Postpartum Hypertension Postpartum hypertension is high blood pressure that remains higher than normal after childbirth. You may not realize that you have postpartum hypertension if your blood pressure is not being checked regularly. In most cases, postpartum hypertension will go away on its own, usually within a week of delivery. However, for some women, medical treatment is required to prevent serious complications, such as seizures or stroke. What are the causes? This condition may be caused by one or  more of the following:  Hypertension that existed before pregnancy (chronic hypertension).  Hypertension that comes on as a result of pregnancy (gestational hypertension).  Hypertensive disorders during pregnancy (preeclampsia) or seizures in women who have high blood pressure during pregnancy (eclampsia).  A condition in which the liver, platelets, and red blood cells are damaged during pregnancy (HELLP syndrome).  A condition in which the thyroid produces too much hormones (hyperthyroidism).  Other rare problems of the nerves (neurological disorders) or blood disorders. In some cases, the cause may not be known. What increases the risk? The following factors may make you more likely to develop this condition:  Chronic hypertension. In some cases, this may not have been diagnosed before pregnancy.  Obesity.  Type 2 diabetes.  Kidney disease.  History of preeclampsia or  eclampsia.  Other medical conditions that change the level of hormones in the body (hormonal imbalance). What are the signs or symptoms? As with all types of hypertension, postpartum hypertension may not have any symptoms. Depending on how high your blood pressure is, you may experience:  Headaches. These may be mild, moderate, or severe. They may also be steady, constant, or sudden in onset (thunderclap headache).  Changes in your ability to see (visual changes).  Dizziness.  Shortness of breath.  Swelling of your hands, feet, lower legs, or face. In some cases, you may have swelling in more than one of these locations.  Heart palpitations or a racing heartbeat.  Difficulty breathing while lying down.  Decrease in the amount of urine that you pass. Other rare signs and symptoms may include:  Sweating more than usual. This lasts longer than a few days after delivery.  Chest pain.  Sudden dizziness when you get up from sitting or lying down.  Seizures.  Nausea or vomiting.  Abdominal pain. How is this diagnosed? This condition may be diagnosed based on the results of a physical exam, blood pressure measurements, and blood and urine tests. You may also have other tests, such as a CT scan or an MRI, to check for other problems of postpartum hypertension. How is this treated? If blood pressure is high enough to require treatment, your options may include:  Medicines to reduce blood pressure (antihypertensives). Tell your health care provider if you are breastfeeding or if you plan to breastfeed. There are many antihypertensive medicines that are safe to take while breastfeeding.  Stopping medicines that may be causing hypertension.  Treating medical conditions that are causing hypertension.  Treating the complications of hypertension, such as seizures, stroke, or kidney problems. Your health care provider will also continue to monitor your blood pressure closely until it is  within a safe range for you. Follow these instructions at home:  Take over-the-counter and prescription medicines only as told by your health care provider.  Return to your normal activities as told by your health care provider. Ask your health care provider what activities are safe for you.  Do not use any products that contain nicotine or tobacco, such as cigarettes and e-cigarettes. If you need help quitting, ask your health care provider.  Keep all follow-up visits as told by your health care provider. This is important. Contact a health care provider if:  Your symptoms get worse.  You have new symptoms, such as: ? A headache that does not get better. ? Dizziness. ? Visual changes. Get help right away if:  You suddenly develop swelling in your hands, ankles, or face.  You have sudden, rapid weight gain.  You develop difficulty breathing, chest pain, racing heartbeat, or heart palpitations. °· You develop severe pain in your abdomen. °· You have any symptoms of a stroke. "BE FAST" is an easy way to remember the main warning signs of a stroke: °? B - Balance. Signs are dizziness, sudden trouble walking, or loss of balance. °? E - Eyes. Signs are trouble seeing or a sudden change in vision. °? F - Face. Signs are sudden weakness or numbness of the face, or the face or eyelid drooping on one side. °? A - Arms. Signs are weakness or numbness in an arm. This happens suddenly and usually on one side of the body. °? S - Speech. Signs are sudden trouble speaking, slurred speech, or trouble understanding what people say. °? T - Time. Time to call emergency services. Write down what time symptoms started. °· You have other signs of a stroke, such as: °? A sudden, severe headache with no known cause. °? Nausea or vomiting. °? Seizure. °These symptoms may represent a serious problem that is an emergency. Do not wait to see if the symptoms will go away. Get medical help right away. Call your local  emergency services (911 in the [place] Do not drive yourself to the hospital. °Summary °· Postpartum hypertension is high blood pressure that remains higher than normal after childbirth. °· In most cases, postpartum hypertension will go away on its own, usually within a week of delivery. °· For some women, medical treatment is required to prevent serious complications, such as seizures or stroke. °This information is not intended to replace advice given to you by your health care provider. Make sure you discuss any questions you have with your health care provider. °Document Released: 08/06/2014 Document Revised: 01/09/2019 Document Reviewed: 09/23/2017 °Elsevier Patient Education © 2020 Elsevier Inc. ° ° ° °

## 2019-11-04 NOTE — Discharge Summary (Signed)
Postpartum Discharge Summary    Patient Name: Jessica Vasquez DOB: July 12, 1991 MRN: [id number]  Date of admission: 10/21/2019 Delivering Provider: Aletha Halim   Date of discharge: 11/04/2019  Admitting diagnosis: 46WKS PRE-ECLAMPSIA Intrauterine pregnancy: [redacted]w[redacted]d    Secondary diagnosis:  Principal Problem:   Severe preeclampsia, delivered Active Problems:   HSV (herpes simplex virus) infection   Supervision of high-risk pregnancy   Alpha thalassemia silent carrier   GDM (gestational diabetes mellitus)   Group beta Strep positive   Non-reassuring fetal heart tones complicating pregnancy, antepartum   [redacted] weeks gestation of pregnancy   S/P cesarean section  Additional problems: None     Discharge diagnosis: Preterm Pregnancy Delivered, Preeclampsia (severe) and GDM A1                                                                                                Post partum procedures:None  Augmentation: None  Complications: None  Hospital course:   28y.o. yo [id number] admitted to the hospital 10/21/2019 for severe preeclampsia.  She was observed on Antenatal Unit and BP was treated.  However, she has nonreassuring fetal tracing, and given she was remote from vaginal delivery, she underwent urgent cesarean section with the following indication:Non-Reassuring FHR.  Membrane Rupture Time/Date:  ,   Patient delivered a Viable infant.10/31/2019  Details of operation can be found in separate operative note.  Patient had a postpartum course complicated by BP which was difficult to control, but was managed on oral Enalapril eventually. She did receive magnesium sulfate for eclampsia prophylaxis as per protocol.  She is ambulating, tolerating a regular diet, passing flatus, and urinating well. Patient is discharged home in stable condition on  11/04/19, with plan for close outpatient follow up.         Delivery time: 10:22 AM    Magnesium Sulfate received: Yes BMZ  received: Yes on 10/29/19 and 10/30/19 Rhophylac:N/A MMR:N/A Transfusion:No  Physical exam  Vitals:   11/03/19 1929 11/04/19 0012 11/04/19 0402 11/04/19 0743  BP: (!) 149/92 (!) 144/86 (!) 141/96 (!) 149/95  Pulse: 92 92 (!) 102 97  Resp: _0 Temp: 98.8 F (37.1 C) 98.5 F (36.9 C) 98.3 F (36.8 C) 98.3 F (36.8 C)  TempSrc: Oral Oral Oral Oral  SpO2: 100% 100% 99% 99%  Weight:      Height:       General: alert, cooperative and no distress Lochia: appropriate Uterine Fundus: firm, NT Incision: Healing well with no significant drainage, No significant erythema, Dressing is clean, dry, and intact DVT Evaluation: No evidence of DVT seen on physical exam. Negative Homan's sign. No cords or calf tenderness. Labs: Lab Results  Component Value Date   WBC 12.7 (H) 11/01/2019   HGB 10.3 (L) 11/01/2019   HCT 31.6 (L) 11/01/2019   MCV 87.5 11/01/2019   PLT 217 11/01/2019   CMP Latest Ref Rng & Units 11/01/2019  Glucose 70 - 99 mg/dL 98  BUN 6 - 20 mg/dL 7  Creatinine 0.44 - 1.00 mg/dL 0.71  Sodium 135 - 145 mmol/L  137  Potassium 3.5 - 5.1 mmol/L 4.1  Chloride 98 - 111 mmol/L 106  CO2 22 - 32 mmol/L 22  Calcium 8.9 - 10.3 mg/dL 7.1(L)  Total Protein 6.5 - 8.1 g/dL 4.9(L)  Total Bilirubin 0.3 - 1.2 mg/dL 0.3  Alkaline Phos 38 - 126 U/L 82  AST 15 - 41 U/L 29  ALT 0 - 44 U/L 18    Discharge instruction: per After Visit Summary and "Baby and Me Booklet".  After visit meds:  Allergies as of 11/04/2019   No Known Allergies     Medication List    STOP taking these medications   Accu-Chek FastClix Lancets Misc   Accu-Chek Guide test strip Generic drug: glucose blood   valACYclovir 500 MG tablet Commonly known as: VALTREX     TAKE these medications   Blood Pressure Monitoring Kit 1 kit by Does not apply route once a week.   Butalbital-APAP-Caffeine 50-325-40 MG capsule Take 1-2 capsules by mouth every 6 (six) hours as needed for headache.    cetirizine-pseudoephedrine 5-120 MG tablet Commonly known as: ZYRTEC-D Take 1 tablet by mouth as needed for allergies.   enalapril 20 MG tablet Commonly known as: VASOTEC Take 1 tablet (20 mg total) by mouth daily.   ferrous sulfate 325 (65 FE) MG EC tablet Take 1 tablet (325 mg total) by mouth 3 (three) times daily with meals. What changed: when to take this   ibuprofen 600 MG tablet Commonly known as: ADVIL Take 1 tablet (600 mg total) by mouth every 6 (six) hours as needed for headache, mild pain, moderate pain or cramping.   oxyCODONE 5 MG immediate release tablet Commonly known as: Oxy IR/ROXICODONE Take 1 tablet (5 mg total) by mouth every 6 (six) hours as needed for moderate pain, severe pain or breakthrough pain.   pantoprazole 40 MG tablet Commonly known as: PROTONIX TAKE 1 TABLET BY MOUTH EVERY DAY   PrePLUS 27-1 MG Tabs Take 1 tablet by mouth daily.   senna-docusate 8.6-50 MG tablet Commonly known as: Senokot-S Take 2 tablets by mouth 2 (two) times daily as needed for mild constipation or moderate constipation.            Discharge Care Instructions  (From admission, onward)         Start     Ordered   11/04/19 0000  Discharge wound care:    Comments: As per discharge handout and nursing instructions   11/04/19 1053          Diet: carb modified diet  Activity: Advance as tolerated. Pelvic rest for 6 weeks.   Follow up Visit: [facility] Follow up in 1 week(s).   Specialty: Obstetrics and Gynecology Why: BP check and incision check Contact information: [address] [place] [phone]         Newborn Data: Live born female  Birth Weight: 4 lb 0.9 oz (1840 g) APGAR: 7, 8  Newborn Delivery   Birth date/time: 10/31/2019 10:22:00 Delivery type: C-Section, Low Transverse Trial of labor: No C-section categorization: Primary      Baby  Feeding: Bottle and Breast Disposition:NICU   11/04/2019 Verita Schneiders, MD

## 2019-11-04 NOTE — Progress Notes (Signed)
Pt discharged home with printed instructions. Pt verbalized an understanding. No concerns noted. Nawal L Ashlyne Olenick, RN 

## 2019-11-04 NOTE — Lactation Note (Signed)
This note was copied from a baby's chart. Lactation Consultation Note  Patient Name: Jessica Vasquez [id number] Date: 11/04/2019 Reason for consult: Follow-up assessment;NICU baby;Preterm <34wks;Infant < 6lbs  LC in to visit with P3 Mom of baby in the NICU.  Baby 68 hrs old and Mom has been pumping regularly and now expressing 30 ml.  Mom prefers pumping at baby's bedside.  Instructed Mom to pump on the regular setting now that her volume has increased to over  20 ml.  Mom encouraged to do breast massage and hand expression as well.  Engorgement prevention and treatment reviewed.  Mom aware of OP lactation support available to her, and encouraged to call with any questions.    Mom ordered a Spectra DEBP from her insurance company.  Will be borrowing a pump from a friend until then.  Reminded Mom of rental available in [employer].  Mom will use the Symphony pump when visiting her baby in the NICU.    Interventions Interventions: Breast feeding basics reviewed;Skin to skin;Breast massage;Hand express;DEBP  Lactation Tools Discussed/Used Tools: Pump Breast pump type: Double-Electric Breast Pump   Consult Status Consult Status: Complete Date: 11/04/19 Follow-up type: Call as needed    Jessica Vasquez 11/04/2019, 8:48 AM

## 2019-11-06 ENCOUNTER — Other Ambulatory Visit: Payer: Self-pay

## 2019-11-06 ENCOUNTER — Inpatient Hospital Stay (HOSPITAL_COMMUNITY)
Admission: AD | Admit: 2019-11-06 | Discharge: 2019-11-06 | Disposition: A | Discharge status: Home / Self Care | Payer: BC Managed Care – PPO | Attending: Obstetrics & Gynecology | Admitting: Obstetrics & Gynecology

## 2019-11-06 ENCOUNTER — Encounter (HOSPITAL_COMMUNITY): Payer: Self-pay

## 2019-11-06 ENCOUNTER — Inpatient Hospital Stay (HOSPITAL_BASED_OUTPATIENT_CLINIC_OR_DEPARTMENT_OTHER): Payer: BC Managed Care – PPO

## 2019-11-06 DIAGNOSIS — Z79899 Other long term (current) drug therapy: Secondary | ICD-10-CM | POA: Insufficient documentation

## 2019-11-06 DIAGNOSIS — R2241 Localized swelling, mass and lump, right lower limb: Secondary | ICD-10-CM | POA: Diagnosis not present

## 2019-11-06 DIAGNOSIS — M7989 Other specified soft tissue disorders: Secondary | ICD-10-CM | POA: Diagnosis not present

## 2019-11-06 DIAGNOSIS — R609 Edema, unspecified: Secondary | ICD-10-CM

## 2019-11-06 DIAGNOSIS — M79621 Pain in right upper arm: Secondary | ICD-10-CM | POA: Diagnosis not present

## 2019-11-06 DIAGNOSIS — Z87891 Personal history of nicotine dependence: Secondary | ICD-10-CM | POA: Diagnosis not present

## 2019-11-06 DIAGNOSIS — D649 Anemia, unspecified: Secondary | ICD-10-CM | POA: Diagnosis not present

## 2019-11-06 DIAGNOSIS — J45909 Unspecified asthma, uncomplicated: Secondary | ICD-10-CM | POA: Insufficient documentation

## 2019-11-06 MED ORDER — ENALAPRIL MALEATE 20 MG PO TABS
20.0000 mg | ORAL_TABLET | Freq: Once | ORAL | Status: AC
  Administered 2019-11-06: 11:00:00 20 mg via ORAL
  Filled 2019-11-06: qty 1

## 2019-11-06 NOTE — MAU Note (Signed)
Pt is G4P3 delivered 10/31/19 C-section, was d/c on 11/18, presents with upper right arm pain, bruising and warmth after receiving an injection while an inpatient. Rating pain 10/10. Has taken hot and cold pack, Ibuprofen and oxycodone with no relief.

## 2019-11-06 NOTE — Progress Notes (Signed)
   11/06/19 0948  Vital Signs  BP (!) 149/97  Provider aware of Bps-

## 2019-11-06 NOTE — MAU Provider Note (Signed)
Chief Complaint:  Arm Pain   First Provider Initiated Contact with Patient 11/06/19 [phone]      HPI: Jessica Vasquez is a 28 y.o. [id number] s/p C-section on 11/14 for NRFHT at 32.4. Course complicated by severe Pre-E. Patient started on Enalapril post-partum and was discharged on 11/18. Reports she has been doing well. No complaints from surgery. She was receiving Lovenox in the hospital and reports that on day of discharge she received a Lovenox injection in right upper arm and heard a "pop." She has had pain since that time that has been worsening. Patient reports swelling. She has tried applying ice without relief.  She denies SOB, LE swelling, headache, visual changes, RUQ pain, vaginal itching/burning, urinary symptoms, h/a, dizziness, n/v, or fever/chills.    Past Medical History: Past Medical History:  Diagnosis Date  . Anemia   . Asthma   . Cholelithiasis   . Headache   . Herpes    last outbreak two weeks ago ( mid march 2015)  . Hydradenitis   . Sleep apnea   . Trichimoniasis 01/14/2019    Past obstetric history: OB History  Gravida Para Term Preterm AB Living  _0 SAB TAB Ectopic Multiple Live Births  1 0 0 0 3    # Outcome Date GA Lbr Len/2nd Weight Sex Delivery Anes PTL Lv  4 Preterm 10/31/19 [redacted]w[redacted]d 1840 g F CS-LTranv Spinal  LIV  3 Term 04/27/14 364w6d0:10 / 00:05 3544 g M Vag-Spont EPI  LIV  2 Term 04/09/12 4125w1d:50 / 00:58 4020 g F Vag-Spont EPI  LIV  1 SAB             Past Surgical History: Past Surgical History:  Procedure Laterality Date  . CESAREAN SECTION N/A 10/31/2019   Procedure: CESAREAN SECTION;  Surgeon: PicAletha HalimD;  Location: [facility];  Service: Obstetrics;  Laterality: N/A;  . CHOLECYSTECTOMY N/A 10/15/2015   Procedure: LAPAROSCOPIC CHOLECYSTECTOMY WITH INTRAOPERATIVE CHOLANGIOGRAM;  Surgeon: MatDonnie MesaD;  [facility];  Laterality: N/A;  . ERCP N/A 10/16/2015   Procedure: ENDOSCOPIC RETROGRADE  CHOLANGIOPANCREATOGRAPHY (ERCP);  Surgeon: CarGatha MayerD;  [facility]  Service: Endoscopy;  Laterality: N/A;  . nasal sugery    . TUBAL LIGATION Bilateral 10/31/2019   Procedure: BILATERAL TUBAL LIGATION;  Surgeon: PicAletha HalimD;  Location: [facility];  Service: Obstetrics;  Laterality: Bilateral;    Family History: Family History  Problem Relation Age of Onset  . Gallstones Mother   . Arthritis Mother   . ADD / ADHD Brother   . Diabetes Maternal Grandmother   . Heart disease Maternal Grandmother   . Hypertension Maternal Grandmother   . Stroke Maternal Grandmother     Social History: Social History   Tobacco Use  . Smoking status: Former Smoker    Quit date: 05/16/2011    Years since quitting: 8.4  . Smokeless tobacco: Never Used  Substance Use Topics  . Alcohol use: No  . Drug use: No    Allergies: No Known Allergies  Meds:  Medications Prior to Admission  Medication Sig Dispense Refill Last Dose  . enalapril (VASOTEC) 20 MG tablet Take 1 tablet (20 mg total) by mouth daily. 30 tablet 0 11/05/2019 at Unknown time  . ibuprofen (ADVIL) 600 MG tablet Take 1 tablet (600 mg total) by mouth every 6 (six) hours as needed for headache, mild pain, moderate pain or cramping. 30Butters  tablet 2 11/05/2019 at Unknown time  . oxyCODONE (OXY IR/ROXICODONE) 5 MG immediate release tablet Take 1 tablet (5 mg total) by mouth every 6 (six) hours as needed for moderate pain, severe pain or breakthrough pain. 30 tablet 0 11/05/2019 at Unknown time  . Prenatal Vit-Fe Fumarate-FA (PREPLUS) 27-1 MG TABS Take 1 tablet by mouth daily. 30 tablet 13 11/05/2019 at Unknown time  . senna-docusate (SENOKOT-S) 8.6-50 MG tablet Take 2 tablets by mouth 2 (two) times daily as needed for mild constipation or moderate constipation. 30 tablet 2 11/05/2019 at Unknown time  . Blood Pressure Monitoring KIT 1 kit by Does not apply route once a week. 1 kit 0   . Butalbital-APAP-Caffeine 50-325-40 MG  capsule Take 1-2 capsules by mouth every 6 (six) hours as needed for headache. 30 capsule 3  at not taking  . cetirizine-pseudoephedrine (ZYRTEC-D) 5-120 MG tablet Take 1 tablet by mouth as needed for allergies.     . ferrous sulfate 325 (65 FE) MG EC tablet Take 1 tablet (325 mg total) by mouth 3 (three) times daily with meals. (Patient taking differently: Take 325 mg by mouth daily with breakfast. ) 90 tablet 3   . pantoprazole (PROTONIX) 40 MG tablet TAKE 1 TABLET BY MOUTH EVERY DAY 30 tablet 1  at not taking    ROS:  Review of Systems All other systems negative unless noted above in HPI.   I have reviewed patient's Past Medical Hx, Surgical Hx, Family Hx, Social Hx, medications and allergies.   Physical Exam   Patient Vitals for the past 24 hrs:  BP Temp Temp src Pulse Resp SpO2 Height Weight  11/06/19 1215 (!) 143/94 - - - 16 - - -  11/06/19 1001 (!) 144/87 - - 87 - - - -  11/06/19 0948 (!) 149/97 - - 78 - - - -  11/06/19 0931 (!) 138/97 98.4 F (36.9 C) Oral 91 19 99 % '5\' 10"'$  (1.778 m) 115.2 kg   Constitutional: Well-developed, well-nourished female in no acute distress.  Cardiovascular: normal rate Respiratory: normal effort GI: Abd soft, non-tender, incision site with honeycomb dressing and CDI MS: Lower extremities nontender, no edema, normal ROM; right upper extremity with superficial swelling just inferior to deltoid muscle; tender to palpation. Non-erythematous and no warmth appreciated. Neurologic: Alert and oriented x 4.  GU: Neg CVAT.   Labs: No results found for this or any previous visit (from the past 24 hour(s)). --/--/AB POS (11/14 7416)  Imaging:  RUE [place]: Right: No evidence of deep vein thrombosis in the upper extremity. No evidence of superficial vein thrombosis in the upper extremity.  MAU Course/MDM: Orders Placed This Encounter  Procedures  . Discharge patient Discharge disposition: 01-Home or Self Care; Discharge patient date: 11/06/2019  . VAS [place]  UPPER EXTREMITY VENOUS DUPLEX    Meds ordered this encounter  Medications  . enalapril (VASOTEC) tablet 20 mg    Patient presents with localized right upper extremity swelling. Likely local inflammation due to irritation from Lovenox injection but will rule out thrombophlebitis or DVT. BP's in mild-range. Patient has not yet taken Enalapril today and this was given.  US obtained of RUE due to patient concern without evidence of clot.  Compression dressing applied and instructed to continued to apply ice. Patient declined any pain medication and felt comfortable discharging. Patient verbalized understanding of discharge and follow-up instructions and was ambulating without assistance upon discharge.    Assessment: 1. Pain in right upper arm  2. Swelling   3. Localized swelling of right lower extremity     Plan: Discharge home with return precautions; f/u as scheduled.  Follow-up [place] for [facility] Follow up.   Specialty: Obstetrics and Gynecology Contact information: [address] [place], [place] [id number] [place] [id number] [phone]         Allergies as of 11/06/2019   No Known Allergies     Medication List    TAKE these medications   Blood Pressure Monitoring Kit 1 kit by Does not apply route once a week.   Butalbital-APAP-Caffeine 50-325-40 MG capsule Take 1-2 capsules by mouth every 6 (six) hours as needed for headache.   cetirizine-pseudoephedrine 5-120 MG tablet Commonly known as: ZYRTEC-D Take 1 tablet by mouth as needed for allergies.   enalapril 20 MG tablet Commonly known as: VASOTEC Take 1 tablet (20 mg total) by mouth daily.   ferrous sulfate 325 (65 FE) MG EC tablet Take 1 tablet (325 mg total) by mouth 3 (three) times daily with meals. What changed: when to take this   ibuprofen 600 MG tablet Commonly known as: ADVIL Take 1 tablet (600 mg total) by mouth every 6 (six)  hours as needed for headache, mild pain, moderate pain or cramping.   oxyCODONE 5 MG immediate release tablet Commonly known as: Oxy IR/ROXICODONE Take 1 tablet (5 mg total) by mouth every 6 (six) hours as needed for moderate pain, severe pain or breakthrough pain.   pantoprazole 40 MG tablet Commonly known as: PROTONIX TAKE 1 TABLET BY MOUTH EVERY DAY   PrePLUS 27-1 MG Tabs Take 1 tablet by mouth daily.   senna-docusate 8.6-50 MG tablet Commonly known as: Senokot-S Take 2 tablets by mouth 2 (two) times daily as needed for mild constipation or moderate constipation.       Barrington Ellison, MD [facility] Family Medicine Fellow, [facility] for [facility], [place] [facility] 11/06/2019 12:28 PM

## 2019-11-06 NOTE — MAU Note (Signed)
PT did not take BP meds today.

## 2019-11-06 NOTE — Progress Notes (Signed)
RUE venous duplex       has been completed. Preliminary results can be found under CV proc through chart review. Korri Ask, BS, RDMS, RVT   

## 2019-11-09 ENCOUNTER — Other Ambulatory Visit: Payer: Self-pay

## 2019-11-09 ENCOUNTER — Ambulatory Visit (INDEPENDENT_AMBULATORY_CARE_PROVIDER_SITE_OTHER): Payer: BC Managed Care – PPO

## 2019-11-09 VITALS — BP 138/94 | HR 94 | Wt 250.1 lb

## 2019-11-09 DIAGNOSIS — Z0130 Encounter for examination of blood pressure without abnormal findings: Secondary | ICD-10-CM

## 2019-11-09 DIAGNOSIS — Z3493 Encounter for supervision of normal pregnancy, unspecified, third trimester: Secondary | ICD-10-CM

## 2019-11-09 NOTE — Progress Notes (Signed)
Patient seen and assessed by nursing staff during this encounter. I have reviewed the chart and agree with the documentation and plan.  Mora Bellman, MD 11/09/2019 10:47 AM

## 2019-11-09 NOTE — Progress Notes (Signed)
..  Subjective:  Jessica Vasquez is a 28 y.o. female here for BP check.   Hypertension ROS: taking medications as instructed, no medication side effects noted, no TIA's, no chest pain on exertion, no dyspnea on exertion and no swelling of ankles.    Objective:  BP (!) 138/94   Pulse 94   Wt 250 lb 1.6 oz (113.4 kg)   Breastfeeding Yes   BMI 35.89 kg/m   Appearance alert, well appearing, and in no distress. General exam BP noted to be well controlled today in office.    Assessment:   Blood Pressure stable.  Pt's incision is clean and dry. Pt denies discharge and odor.  Plan:  Per provider instructions, advised pt to continue taking vasotec as prescribed and Tylenol if headache occurs. Advised pt that if headache is not relieved by Tylenol, report to MAU, pt agreed. PP visit scheduled for 12-02-19.Marland Kitchen

## 2019-11-16 ENCOUNTER — Ambulatory Visit: Payer: Self-pay

## 2019-11-16 NOTE — Lactation Note (Signed)
This note was copied from a baby's chart. Lactation Consultation Note: Feeding consult with mother and baby . Infant is 87 weeks old.  Mother reports that she is breastfeeding infant every other feeding.   Mother reports that she needs assistance with latching infant to the left breast.  Positioning mother and infant STS with infant in football position.  Assist mother with firming her nipple and hand expressing.  Infant latched without difficulty and sustained latch for 10 mins with steady pattern of suckling and audible swallows. Assist mother with flanging infants lips for wider gape several times.  Infant became sleepy and shut down.  Mother taught to do breast compression.  Infant placed in cross cradle hold   and attempt to latch but too sleepy.   Observed that infants lips are blanched white and appear that they have suckling blisters.  Mother reports that her lips have been like this since she was born. Informed nurse Earlie Raveling of infants lips. She reports that she thinks that conditions of lips are coming from infant being dry.   Mother reports that she is pumping every 2-3 hours. She is pumping 60-62ml per breast with each pumping. Discussed importance of breast massage and hands on pumping.   Mother reports that she normally stays with infant for several days before going home. She plans to go home today for 3-4 hours.Advised mother to continue with [place] follow as needed. Encouraged mother to phone when she wants [place] assistance.   Patient Name: Girl Shantele Reller [id number] Date: 11/16/2019 Reason for consult: Follow-up assessment;NICU baby   Maternal Data    Feeding Feeding Type: Breast Milk with Formula added Nipple Type: Nfant Extra Slow Flow (gold)  LATCH Score Latch: Grasps breast easily, tongue down, lips flanged, rhythmical sucking.  Audible Swallowing: Spontaneous and intermittent  Type of Nipple: Everted at rest and after stimulation  Comfort (Breast/Nipple):  Filling, red/small blisters or bruises, mild/mod discomfort  Hold (Positioning): Assistance needed to correctly position infant at breast and maintain latch.  LATCH Score: 8  Interventions Interventions: Assisted with latch;Skin to skin;Hand express;Breast compression;Adjust position;Support pillows;Position options;Hand pump;DEBP  Lactation Tools Discussed/Used     Consult Status Consult Status: PRN    Darla Lesches 11/16/2019, 12:47 PM

## 2019-12-02 ENCOUNTER — Ambulatory Visit: Payer: BC Managed Care – PPO | Admitting: Obstetrics and Gynecology

## 2019-12-03 ENCOUNTER — Other Ambulatory Visit: Payer: Self-pay

## 2019-12-03 ENCOUNTER — Ambulatory Visit (INDEPENDENT_AMBULATORY_CARE_PROVIDER_SITE_OTHER): Payer: BC Managed Care – PPO | Admitting: Women's Health

## 2019-12-03 ENCOUNTER — Encounter: Payer: Self-pay | Admitting: Women's Health

## 2019-12-03 DIAGNOSIS — O1414 Severe pre-eclampsia complicating childbirth: Secondary | ICD-10-CM

## 2019-12-03 DIAGNOSIS — Z1389 Encounter for screening for other disorder: Secondary | ICD-10-CM | POA: Diagnosis not present

## 2019-12-03 MED ORDER — ENALAPRIL MALEATE 10 MG PO TABS: 10.0000 mg | ORAL_TABLET | Freq: Every day | ORAL | 1 refills | Status: DC

## 2019-12-03 NOTE — Progress Notes (Signed)
Post Partum Exam  Jessica Vasquez is a 28 y.o. [phone] female who presents for a postpartum visit. She is 4 weeks postpartum following a low transverse Cesarean section. I have fully reviewed the prenatal and intrapartum course. The delivery was at [redacted]w[redacted]d gestational weeks.  Anesthesia: spinal. Postpartum course has been unremarkable. Baby's course has been unremarkable. Baby is feeding by both breast and bottle - unsure formula name.Bleeding: intermittent light bleeding. Bowel function is normal. Bladder function is normal. Patient is not sexually active. Contraception method is tubal ligation. Postpartum depression screening:neg EPDS= 0. Pt reports no concerns today.  Last pap smear done 06/2019 and was Normal.  BP today 111/76. Pt reports has been taking enalapril 20mg , but did not take it this morning.  Review of Systems Pertinent items noted in HPI and remainder of comprehensive ROS otherwise negative.    Objective:  currently breastfeeding.  General:  alert, cooperative and no distress   Breasts:  inspection negative, no nipple discharge or bleeding, no masses or nodularity palpable  Lungs: clear to auscultation bilaterally  Heart:  regular rate and rhythm, S1, S2 normal, no murmur, click, rub or gallop  Abdomen: soft, non-tender; bowel sounds normal; no masses,  no organomegaly   Vulva:  pt declined  Vagina: pt declined  Cervix:  pt declined  Corpus: pt declined  Adnexa:  pt declined  Rectal Exam: pt declined        Assessment:    Normal postpartum exam. Pap smear not done at today's visit.   Plan:   1. Contraception: tubal ligation, pt unsure of decision, discussed tubal reversal and IVF 2. Unable to perform ppartum GDM testing today as patient is not fasting, will reschedule, pt aware. 3. Per consultation with Dr. Harolyn Rutherford, will start to wean patient off enalapril, will reduce to 10mg  daily (new RX sent) and patient to return in one week for BP check as she does not have a  primary care provider. 4. Pt denies any HSV outbreaks at this time 5. Follow up in: 1 week for BP check and post-partum GDM testing.

## 2019-12-03 NOTE — Patient Instructions (Signed)
Postpartum Care After Cesarean Delivery °This sheet gives you information about how to care for yourself from the time you deliver your baby to up to 6-12 weeks after delivery (postpartum period). Your health care provider may also give you more specific instructions. If you have problems or questions, contact your health care provider. °Follow these instructions at home: °Medicines °· Take over-the-counter and prescription medicines only as told by your health care provider. °· If you were prescribed an antibiotic medicine, take it as told by your health care provider. Do not stop taking the antibiotic even if you start to feel better. °· Ask your health care provider if the medicine prescribed to you: °? Requires you to avoid driving or using heavy machinery. °? Can cause constipation. You may need to take actions to prevent or treat constipation, such as: °§ Drink enough fluid to keep your urine pale yellow. °§ Take over-the-counter or prescription medicines. °§ Eat foods that are high in fiber, such as beans, whole grains, and fresh fruits and vegetables. °§ Limit foods that are high in fat and processed sugars, such as fried or sweet foods. °Activity °· Gradually return to your normal activities as told by your health care provider. °· Avoid activities that take a lot of effort and energy (are strenuous) until approved by your health care provider. Walking at a slow to moderate pace is usually safe. Ask your health care provider what activities are safe for you. °? Do not lift anything that is heavier than your baby or 10 lb (4.5 kg) as told by your health care provider. °? Do not vacuum, climb stairs, or drive a car for as long as told by your health care provider. °· If possible, have someone help you at home until you are able to do your usual activities yourself. °· Rest as much as possible. Try to rest or take naps while your baby is sleeping. °Vaginal bleeding °· It is normal to have vaginal bleeding  (lochia) after delivery. Wear a sanitary pad to absorb vaginal bleeding and discharge. °? During the first week after delivery, the amount and appearance of lochia is often similar to a menstrual period. °? Over the next few weeks, it will gradually decrease to a dry, yellow-brown discharge. °? For most women, lochia stops completely by 4-6 weeks after delivery. Vaginal bleeding can vary from woman to woman. °· Change your sanitary pads frequently. Watch for any changes in your flow, such as: °? A sudden increase in volume. °? A change in color. °? Large blood clots. °· If you pass a blood clot, save it and call your health care provider to discuss. Do not flush blood clots down the toilet before you get instructions from your health care provider. °· Do not use tampons or douches until your health care provider says this is safe. °· If you are not breastfeeding, your period should return 6-8 weeks after delivery. If you are breastfeeding, your period may return anytime between 8 weeks after delivery and the time that you stop breastfeeding. °Perineal care ° °· If your C-section (Cesarean section) was unplanned, and you were allowed to labor and push before delivery, you may have pain, swelling, and discomfort of the tissue between your vaginal opening and your anus (perineum). You may also have an incision in the tissue (episiotomy) or the tissue may have torn during delivery. Follow these instructions as told by your health care provider: °? Keep your perineum clean and dry as told by   your health care provider. Use medicated pads and pain-relieving sprays and creams as directed. °? If you have an episiotomy or vaginal tear, check the area every day for signs of infection. Check for: °§ Redness, swelling, or pain. °§ Fluid or blood. °§ Warmth. °§ Pus or a bad smell. °? You may be given a squirt bottle to use instead of wiping to clean the perineum area after you go to the bathroom. As you start healing, you may use  the squirt bottle before wiping yourself. Make sure to wipe gently. °? To relieve pain caused by an episiotomy, vaginal tear, or hemorrhoids, try taking a warm sitz bath 2-3 times a day. A sitz bath is a warm water bath that is taken while you are sitting down. The water should only come up to your hips and should cover your buttocks. °Breast care °· Within the first few days after delivery, your breasts may feel heavy, full, and uncomfortable (breast engorgement). You may also have milk leaking from your breasts. Your health care provider can suggest ways to help relieve breast discomfort. Breast engorgement should go away within a few days. °· If you are breastfeeding: °? Wear a bra that supports your breasts and fits you well. °? Keep your nipples clean and dry. Apply creams and ointments as told by your health care provider. °? You may need to use breast pads to absorb milk leakage. °? You may have uterine contractions every time you breastfeed for several weeks after delivery. Uterine contractions help your uterus return to its normal size. °? If you have any problems with breastfeeding, work with your health care provider or a lactation consultant. °· If you are not breastfeeding: °? Avoid touching your breasts as this can make your breasts produce more milk. °? Wear a well-fitting bra and use cold packs to help with swelling. °? Do not squeeze out (express) milk. This causes you to make more milk. °Intimacy and sexuality °· Ask your health care provider when you can engage in sexual activity. This may depend on your: °? Risk of infection. °? Healing rate. °? Comfort and desire to engage in sexual activity. °· You are able to get pregnant after delivery, even if you have not had your period. If desired, talk with your health care provider about methods of family planning or birth control (contraception). °Lifestyle °· Do not use any products that contain nicotine or tobacco, such as cigarettes, e-cigarettes,  and chewing tobacco. If you need help quitting, ask your health care provider. °· Do not drink alcohol, especially if you are breastfeeding. °Eating and drinking ° °· Drink enough fluid to keep your urine pale yellow. °· Eat high-fiber foods every day. These may help prevent or relieve constipation. High-fiber foods include: °? Whole grain cereals and breads. °? Brown rice. °? Beans. °? Fresh fruits and vegetables. °· Take your prenatal vitamins until your postpartum checkup or until your health care provider tells you it is okay to stop. °General instructions °· Keep all follow-up visits for you and your baby as told by your health care provider. Most women visit their health care provider for a postpartum checkup within the first 3-6 weeks after delivery. °Contact a health care provider if you: °· Feel unable to cope with the changes that a new baby brings to your life, and these feelings do not go away. °· Feel unusually sad or worried. °· Have breasts that are painful, hard, or turn red. °· Have a fever. °·   Have trouble holding urine or keeping urine from leaking. °· Have little or no interest in activities you used to enjoy. °· Have not breastfed at all and you have not had a menstrual period for 12 weeks after delivery. °· Have stopped breastfeeding and you have not had a menstrual period for 12 weeks after you stopped breastfeeding. °· Have questions about caring for yourself or your baby. °· Pass a blood clot from your vagina. °Get help right away if you: °· Have chest pain. °· Have difficulty breathing. °· Have sudden, severe leg pain. °· Have severe pain or cramping in your abdomen. °· Bleed from your vagina so much that you fill more than one sanitary pad in one hour. Bleeding should not be heavier than your heaviest period. °· Develop a severe headache. °· Faint. °· Have blurred vision or spots in your vision. °· Have a bad-smelling vaginal discharge. °· Have thoughts about hurting yourself or your  baby. °If you ever feel like you may hurt yourself or others, or have thoughts about taking your own life, get help right away. You can go to your nearest emergency department or call: °· Your local emergency services (911 in the [place] °· A suicide crisis helpline, such as the National Suicide Prevention Lifeline at 1-800-273-8255. This is open 24 hours a day. °Summary °· The period of time from when you deliver your baby to up to 6-12 weeks after delivery is called the postpartum period. °· Gradually return to your normal activities as told by your health care provider. °· Keep all follow-up visits for you and your baby as told by your health care provider. °This information is not intended to replace advice given to you by your health care provider. Make sure you discuss any questions you have with your health care provider. °Document Released: 11/30/2000 Document Revised: 07/23/2018 Document Reviewed: 07/23/2018 °Elsevier Patient Education © 2020 [employer] ° °

## 2019-12-09 ENCOUNTER — Ambulatory Visit: Payer: BC Managed Care – PPO

## 2019-12-09 VITALS — BP 150/87 | HR 88

## 2019-12-09 DIAGNOSIS — Z0130 Encounter for examination of blood pressure without abnormal findings: Secondary | ICD-10-CM

## 2019-12-09 NOTE — Progress Notes (Signed)
..  Subjective:  Jessica Vasquez is a 28 y.o. female here for BP check.   Hypertension ROS: not taking medications regularly as instructed, no chest pain on exertion, no dyspnea on exertion, no swelling of ankles and pt reports headaches..    Objective:  BP (!) 150/87   Pulse 88   Appearance alert, complains of headaches. Pt states that she thought she could stop taking BP med because her BP was good at her last visit.   Assessment:   Blood Pressure poorly controlled.   Plan:  Instructed patient to start taking previously prescribed BP med and follow up for BP check in 2 weeks. Advised to contact us if symptoms do not improve or report to hospital, pt agreed.

## 2019-12-14 ENCOUNTER — Other Ambulatory Visit: Payer: BC Managed Care – PPO

## 2019-12-22 ENCOUNTER — Inpatient Hospital Stay (HOSPITAL_COMMUNITY): Admit: 2019-12-22 | Payer: Self-pay

## 2019-12-24 ENCOUNTER — Ambulatory Visit (INDEPENDENT_AMBULATORY_CARE_PROVIDER_SITE_OTHER): Payer: BC Managed Care – PPO

## 2019-12-24 VITALS — BP 128/79

## 2019-12-24 DIAGNOSIS — O1414 Severe pre-eclampsia complicating childbirth: Secondary | ICD-10-CM

## 2019-12-24 NOTE — Progress Notes (Signed)
Virtual Visit via Telephone Note  I connected with Jessica Vasquez on 12/24/19 at  3:15 PM EST by telephone and verified that I am speaking with the correct person using two identifiers.  Subjective:  Jessica Vasquez is a 29 y.o. female virtual visit for BP check.   Hypertension ROS: taking medications as instructed, no medication side effects noted, no TIA's, no chest pain on exertion, no dyspnea on exertion and no swelling of ankles.    Objective:  128/79  Appearance alert, well appearing, and in no distress. General exam BP noted to be well controlled today in office.    Assessment:   Blood Pressure well controlled.   Plan:  Current treatment plan is effective, no change in therapy.

## 2020-01-03 ENCOUNTER — Other Ambulatory Visit: Payer: Self-pay | Admitting: Obstetrics & Gynecology

## 2020-01-03 DIAGNOSIS — Z3480 Encounter for supervision of other normal pregnancy, unspecified trimester: Secondary | ICD-10-CM

## 2020-01-04 ENCOUNTER — Other Ambulatory Visit: Payer: Self-pay

## 2020-01-04 DIAGNOSIS — O1414 Severe pre-eclampsia complicating childbirth: Secondary | ICD-10-CM

## 2020-01-04 MED ORDER — ENALAPRIL MALEATE 10 MG PO TABS: 10.0000 mg | ORAL_TABLET | Freq: Every day | ORAL | 1 refills | Status: DC

## 2020-01-15 NOTE — Progress Notes (Signed)
Patient seen and assessed by nursing staff during this encounter. I have reviewed the chart and agree with the documentation and plan.  Scheryl Darter, MD 01/15/2020 2:49 PM  Patient ID: Jessica Vasquez, female   DOB: 03-Jul-1991, 29 y.o.   MRN: [id number]

## 2020-01-25 ENCOUNTER — Other Ambulatory Visit: Payer: Self-pay

## 2020-01-25 DIAGNOSIS — O1414 Severe pre-eclampsia complicating childbirth: Secondary | ICD-10-CM

## 2020-01-25 MED ORDER — ENALAPRIL MALEATE 10 MG PO TABS: 10.0000 mg | ORAL_TABLET | Freq: Every day | ORAL | 0 refills | Status: DC

## 2020-02-15 ENCOUNTER — Telehealth: Payer: Self-pay

## 2020-02-15 NOTE — Telephone Encounter (Signed)
TC to pt regarding triage fax c/o heavy bleeding While breastfeeding I consulted with provider in Office and if bleeding was irregular and scant all this expected provider following a BTL  stated if bleeding is still heavy pt needs to go to hospital for a eval Pt confirms bleeding has slowed down pt advised to monitor and if bleeding increases to soaking a pad in 30 min to 1 hr to report to hospital pt voiced understanding.

## 2020-03-03 ENCOUNTER — Other Ambulatory Visit: Payer: Self-pay | Admitting: Obstetrics and Gynecology

## 2020-03-03 DIAGNOSIS — O1414 Severe pre-eclampsia complicating childbirth: Secondary | ICD-10-CM

## 2020-04-04 IMAGING — US US MFM FETAL BPP W/O NON-STRESS
1 series · 12 of 28 positions shown · non-contrast
Comparison: none

[Series 1: us mfm fetal bpp w/o non-stress · 34 acquisitions, 12 frames shown]
[im 2/34]
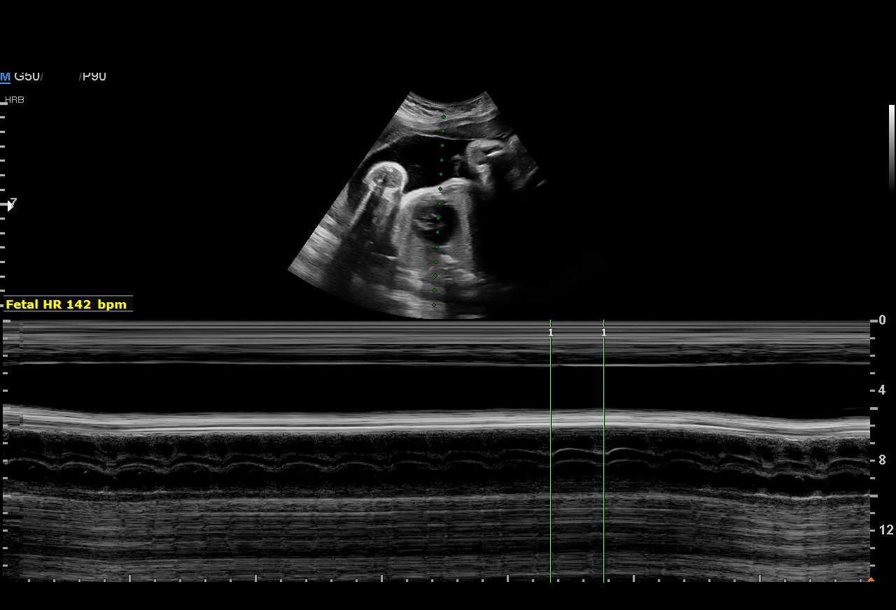
[im 4/34]
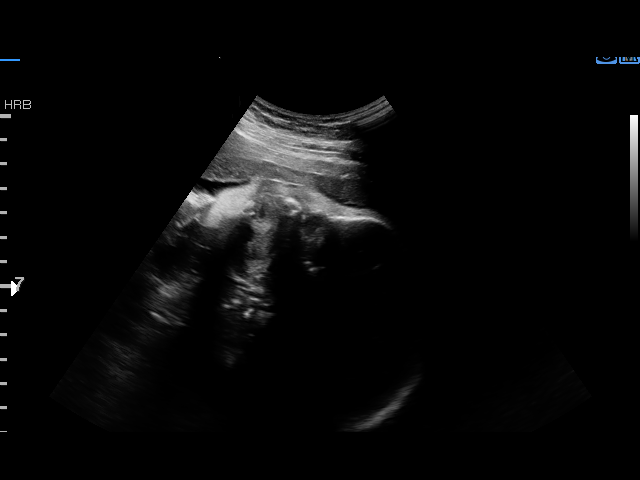
[im 7/34]
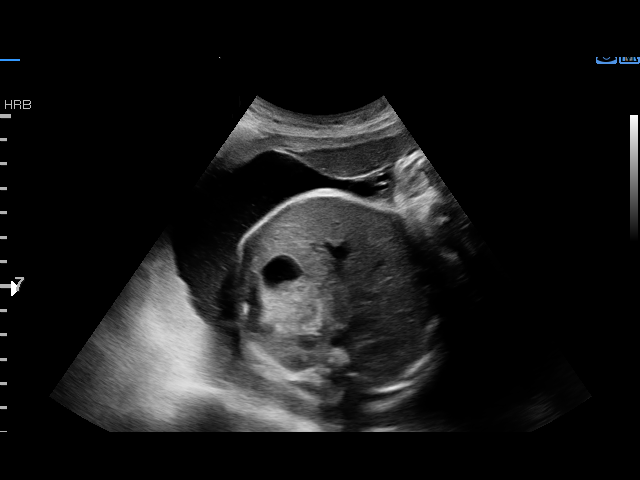
[im 10/34]
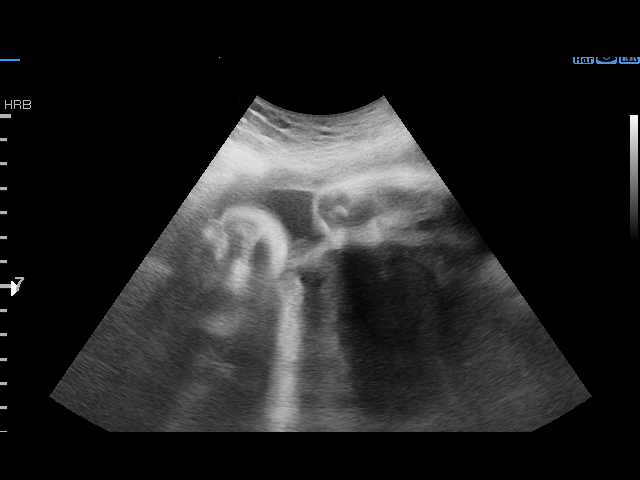
[im 13/34]
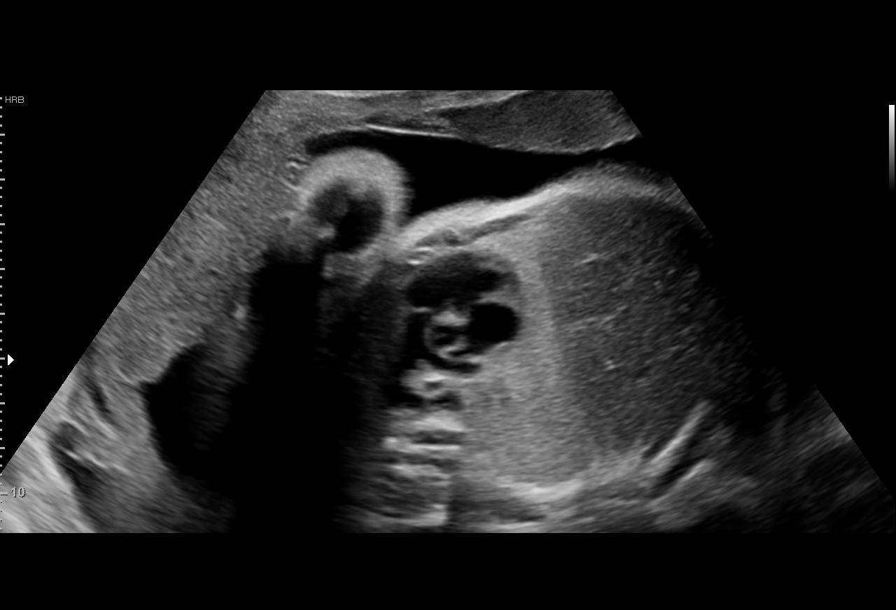
[im 15/34]
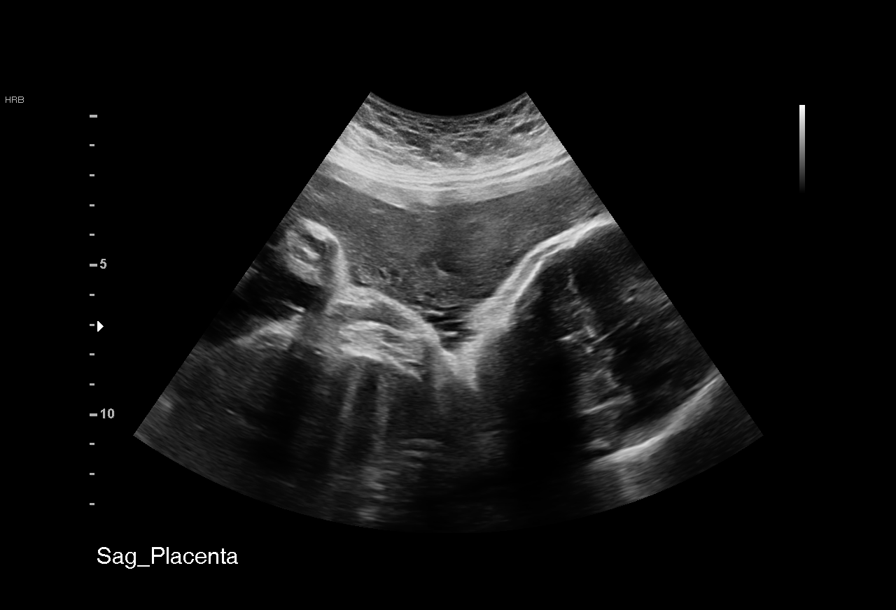
[im 19/34]
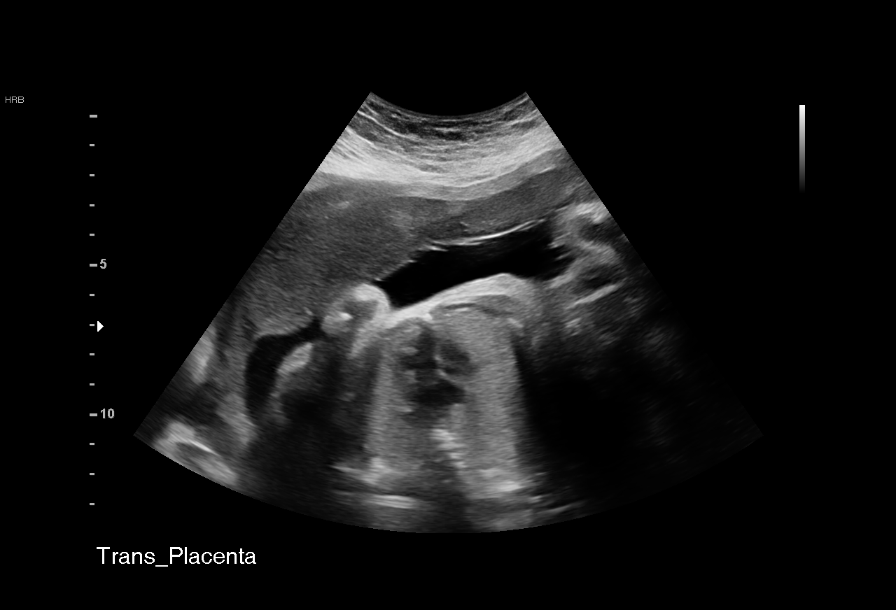
[im 21/34]
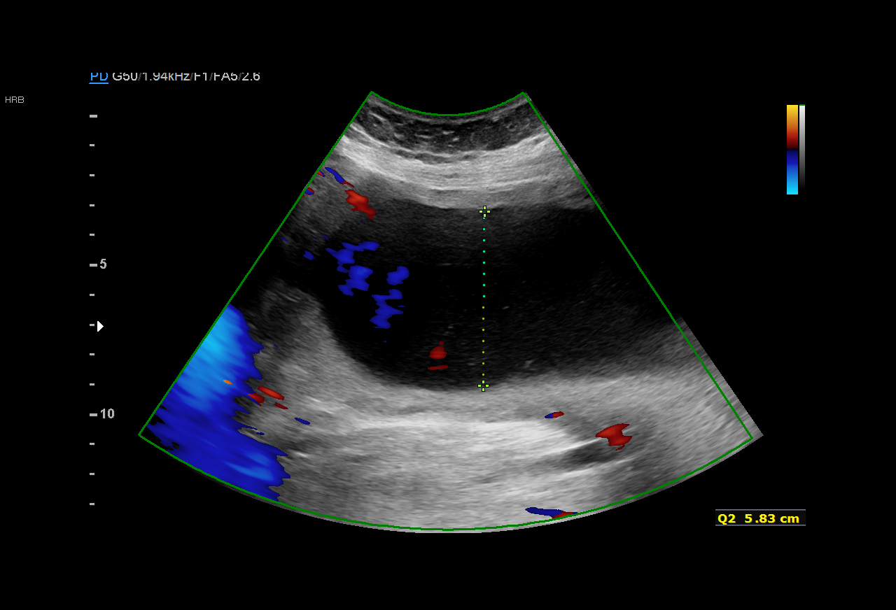
[im 24/34]
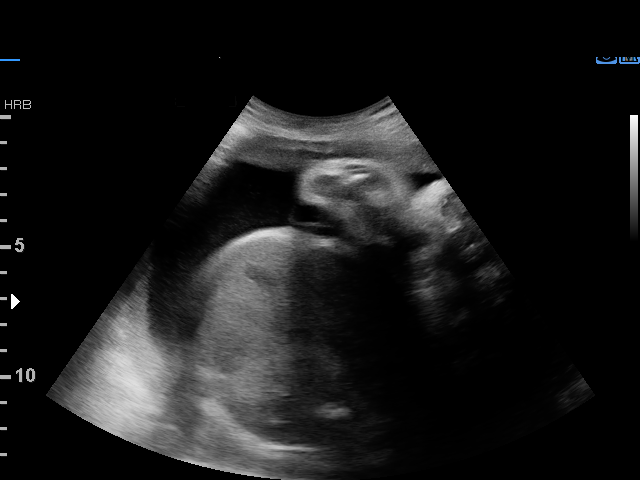
[im 27/34]
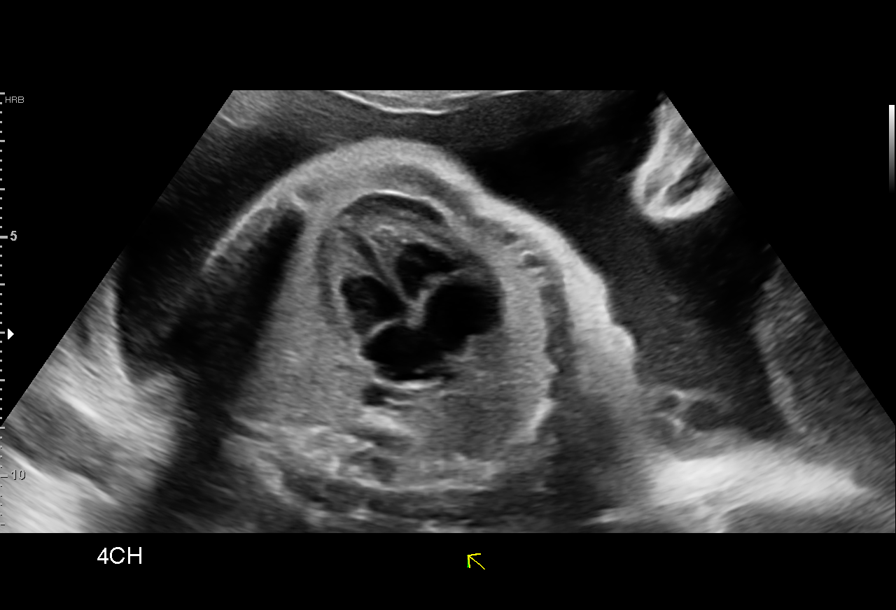
[im 30/34]
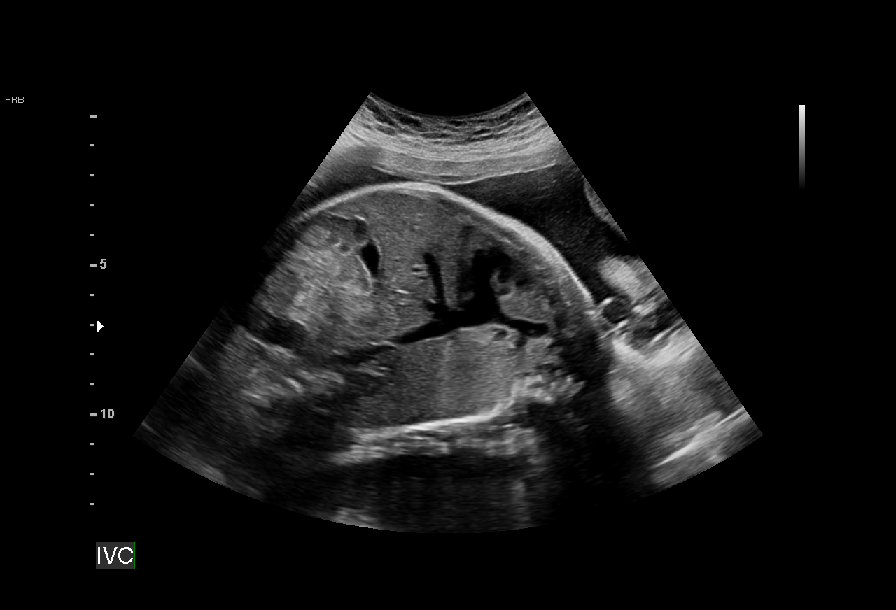
[im 32/34]
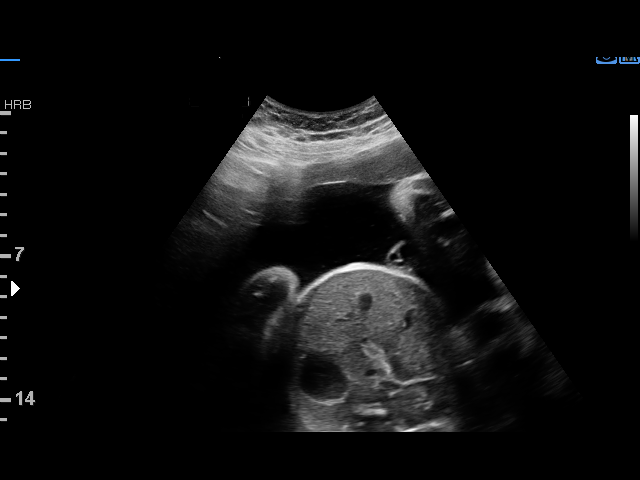

[12 of 28 positions shown; findings below may reference images not displayed]

Attending:        Marta Heniek Malmon        Secondary Phy.:   [facility]
                                                            Care

 ----------------------------------------------------------------------

 ----------------------------------------------------------------------
Indications

  Abnormal finding on antenatal screening
  ([DATE] BPP last night)
  31 weeks gestation of pregnancy
  Genetic carrier (Alpha Thalassemia silent
  carrier)
  Gestational hypertension without significant
  proteinuria, third trimester
  Anemia during pregnancy in third trimester
  Obesity complicating pregnancy, third
  trimester
 ----------------------------------------------------------------------
Vital Signs

                                                Height:        5'9"
Fetal Evaluation

 Num Of Fetuses:         1
 Fetal Heart Rate(bpm):  142
 Cardiac Activity:       Observed
 Presentation:           Cephalic
 Placenta:               Anterior

 Amniotic Fluid
 AFI FV:      Within normal limits

 AFI Sum(cm)     %Tile       Largest Pocket(cm)
 21.68           84

 RUQ(cm)       RLQ(cm)       LUQ(cm)        LLQ(cm)

Biophysical Evaluation

 Amniotic F.V:   Within normal limits       F. Tone:        Observed
 F. Movement:    Observed                   Score:          [DATE]
 F. Breathing:   Observed
OB History

 Gravidity:    4         Term:   2        Prem:   0        SAB:   1
 TOP:          0       Ectopic:  0        Living: 2
Gestational Age

 LMP:           31w 6d        Date:  03/17/19                 EDD:   12/22/19
 Best:          31w 6d     Det. By:  LMP  (03/17/19)          EDD:   12/22/19
Anatomy

 Stomach:               Appears normal, left   Bladder:                Appears normal
                        sided
 Kidneys:               Appear normal
Cervix Uterus Adnexa

 Cervix
 Not visualized (advanced GA >02wks)
Impression

 Preeclampsia with severe features.
 Amniotic fluid is normal and good fetal activity is seen.
 Antenatal testing is reassuring. BPP [DATE].
Recommendations

 -Continue twice-weekly BPP till delivery.
                 Jarquin, Ajay

## 2020-08-01 ENCOUNTER — Ambulatory Visit (HOSPITAL_COMMUNITY)
Admission: EM | Admit: 2020-08-01 | Discharge: 2020-08-01 | Disposition: A | Discharge status: Home / Self Care | Payer: BC Managed Care – PPO | Attending: Family Medicine | Admitting: Family Medicine

## 2020-08-01 ENCOUNTER — Encounter (HOSPITAL_COMMUNITY): Payer: Self-pay

## 2020-08-01 DIAGNOSIS — D649 Anemia, unspecified: Secondary | ICD-10-CM | POA: Diagnosis not present

## 2020-08-01 DIAGNOSIS — G4730 Sleep apnea, unspecified: Secondary | ICD-10-CM | POA: Diagnosis not present

## 2020-08-01 DIAGNOSIS — J069 Acute upper respiratory infection, unspecified: Secondary | ICD-10-CM | POA: Diagnosis not present

## 2020-08-01 DIAGNOSIS — Z79899 Other long term (current) drug therapy: Secondary | ICD-10-CM | POA: Diagnosis not present

## 2020-08-01 DIAGNOSIS — Z9049 Acquired absence of other specified parts of digestive tract: Secondary | ICD-10-CM | POA: Diagnosis not present

## 2020-08-01 DIAGNOSIS — R05 Cough: Secondary | ICD-10-CM | POA: Insufficient documentation

## 2020-08-01 DIAGNOSIS — Z87891 Personal history of nicotine dependence: Secondary | ICD-10-CM | POA: Diagnosis not present

## 2020-08-01 DIAGNOSIS — Z20822 Contact with and (suspected) exposure to covid-19: Secondary | ICD-10-CM | POA: Diagnosis not present

## 2020-08-01 DIAGNOSIS — J45909 Unspecified asthma, uncomplicated: Secondary | ICD-10-CM | POA: Diagnosis not present

## 2020-08-01 NOTE — ED Provider Notes (Signed)
[facility]    CSN: [id number] Arrival date & time: 08/01/20  1620      History   Chief Complaint Chief Complaint  Patient presents with  . Cough    HPI Jessica Vasquez is a 29 y.o. female.   Jessica Vasquez presents with complaints of cough. Cough is productive. Started 2 days ago. No fever. Some sore throat. No nasal congestion. No ear pain. No body aches or headache. Nausea, no vomiting or diarrhea. No known ill contacts. Requesting covid testing. Had covid in June on 2021. Works as a Pharmacist, hospital. Hasn't Taken any medications for her symptoms. No shortness of breath .    ROS per HPI, negative if not otherwise mentioned.      Past Medical History:  Diagnosis Date  . [redacted] weeks gestation of pregnancy 10/31/2019  . Anemia   . Asthma   . Cholelithiasis   . Gestational hypertension 09/30/2019   Noted at 28 week visit Will need delivery @ 37 weeks  . Group beta Strep positive 10/23/2019  . Headache   . Herpes    last outbreak two weeks ago ( mid march 2015)  . Hydradenitis   . Non-reassuring fetal heart tones complicating pregnancy, antepartum 10/31/2019  . Preeclampsia 10/14/2019   No severe features  Guidelines for Antenatal Testing and Sonography  (with updated ICD-10 codes)  Updated  07/19/19 with Dr. Tama High  INDICATION U/S 2 X week NST/AFI  or full BPP wkly DELIVERY Diabetes   A1 - good control - O24.410    A2 - good control - O24.419      A2  - poor control or poor compliance - O24.419, E11.65   (Macrosomia or polyhydramnios) **E11.65 is extra code for poor contr  . Sleep apnea   . Supervision of high-risk pregnancy 06/09/2019    Nursing Staff Provider Office Location  [facility] Dating  LMP/1st trimester [place]  Language   ENGLISH Anatomy US  Normal with limited views> wnl Flu Vaccine  Declined 09/30/19 Genetic Screen  NIPS: low risk, female    AFP: low risk TDaP vaccine   declined 09/30/19 Hgb A1C or  GTT Early  Third trimester  Rhogam   NA, AB+   LAB RESULTS  Feeding  Plan  Breastfeed Blood Type AB/Positive/-- (07/08 1333)   . Trichimoniasis 01/14/2019    Patient Active Problem List   Diagnosis Date Noted  . S/P cesarean section 11/04/2019  . Severe preeclampsia, delivered 10/21/2019  . GDM (gestational diabetes mellitus) 10/01/2019  . Snoring 08/06/2019  . Alpha thalassemia silent carrier 08/05/2019  . Chronic nasal congestion 05/07/2019  . Nasal polyp 05/07/2019  . Anemia 10/20/2013  . HSV (herpes simplex virus) infection 10/20/2013    Past Surgical History:  Procedure Laterality Date  . CESAREAN SECTION N/A 10/31/2019   Procedure: CESAREAN SECTION;  Surgeon: Aletha Halim, MD;  Location: [facility];  Service: Obstetrics;  Laterality: N/A;  . CHOLECYSTECTOMY N/A 10/15/2015   Procedure: LAPAROSCOPIC CHOLECYSTECTOMY WITH INTRAOPERATIVE CHOLANGIOGRAM;  Surgeon: Donnie Mesa, MD;  Location: [place];  Service: General;  Laterality: N/A;  . ERCP N/A 10/16/2015   Procedure: ENDOSCOPIC RETROGRADE CHOLANGIOPANCREATOGRAPHY (ERCP);  Surgeon: Gatha Mayer, MD;  Location[facility];  Service: Endoscopy;  Laterality: N/A;  . nasal sugery    . TUBAL LIGATION Bilateral 10/31/2019   Procedure: BILATERAL TUBAL LIGATION;  Surgeon: Aletha Halim, MD;  Location: [facility];  Service: Obstetrics;  Laterality: Bilateral;    OB History    [place]  4   Para  3   Term  2   Preterm  1   AB  1   Living  3     SAB  1   TAB  0   Ectopic  0   Multiple  0   Live Births  3            Home Medications    Prior to Admission medications   Medication Sig Start Date End Date Taking? Authorizing Provider  Blood Pressure Monitoring KIT 1 kit by Does not apply route once a week. Patient not taking: Reported on 11/09/2019 06/24/19   Tresea Mall, CNM  Butalbital-APAP-Caffeine [phone] MG capsule Take 1-2 capsules by mouth every 6 (six) hours as needed for headache. Patient not taking: Reported on 11/09/2019 07/10/19   Seabron Spates, CNM    cetirizine-pseudoephedrine (ZYRTEC-D) 5-120 MG tablet Take 1 tablet by mouth as needed for allergies.    [provider]  enalapril (VASOTEC) 10 MG tablet Take 1 tablet (10 mg total) by mouth daily. 01/25/20 03/25/20  Constant, Peggy, MD  ferrous sulfate 325 (65 FE) MG EC tablet Take 1 tablet (325 mg total) by mouth 3 (three) times daily with meals. Patient not taking: Reported on 11/09/2019 09/30/19   Sloan Leiter, MD  ibuprofen (ADVIL) 600 MG tablet Take 1 tablet (600 mg total) by mouth every 6 (six) hours as needed for headache, mild pain, moderate pain or cramping. Patient not taking: Reported on 12/03/2019 11/04/19   Anyanwu, Sallyanne Havers, MD  oxyCODONE (OXY IR/ROXICODONE) 5 MG immediate release tablet Take 1 tablet (5 mg total) by mouth every 6 (six) hours as needed for moderate pain, severe pain or breakthrough pain. Patient not taking: Reported on 11/09/2019 11/04/19   Osborne Oman, MD  pantoprazole (PROTONIX) 40 MG tablet TAKE 1 TABLET BY MOUTH EVERY DAY 01/04/20   Woodroe Mode, MD  Prenatal Vit-Fe Fumarate-FA (PREPLUS) 27-1 MG TABS Take 1 tablet by mouth daily. Patient not taking: Reported on 11/09/2019 01/30/19   Darlina Rumpf, CNM  senna-docusate (SENOKOT-S) 8.6-50 MG tablet Take 2 tablets by mouth 2 (two) times daily as needed for mild constipation or moderate constipation. Patient not taking: Reported on 11/09/2019 11/04/19   Osborne Oman, MD    Family History Family History  Problem Relation Age of Onset  . Gallstones Mother   . Arthritis Mother   . ADD / ADHD Brother   . Diabetes Maternal Grandmother   . Heart disease Maternal Grandmother   . Hypertension Maternal Grandmother   . Stroke Maternal Grandmother     Social History Social History   Tobacco Use  . Smoking status: Former Smoker    Quit date: 05/16/2011    Years since quitting: 9.2  . Smokeless tobacco: Never Used  Vaping Use  . Vaping Use: Never used  Substance Use Topics  . Alcohol  use: No  . Drug use: No     Allergies   Patient has no known allergies.   Review of Systems Review of Systems   Physical Exam Triage Vital Signs ED Triage Vitals  Enc Vitals Group     BP 08/01/20 1853 111/66     Pulse Rate 08/01/20 1853 87     Resp 08/01/20 1853 18     Temp 08/01/20 1853 99.5 F (37.5 C)     Temp Source 08/01/20 1853 Oral     SpO2 08/01/20 1853 99 %     Weight --  Height --      Head Circumference --      Peak Flow --      Pain Score 08/01/20 1852 0     Pain Loc --      Pain Edu? --      Excl. in [place]? --    No data found.  Updated Vital Signs BP 111/66 (BP Location: Right Arm)   Pulse 87   Temp 99.5 F (37.5 C) (Oral)   Resp 18   LMP  (Within Weeks) Comment: 1 week  SpO2 99%   Visual Acuity Right Eye Distance:   Left Eye Distance:   Bilateral Distance:    Right Eye Near:   Left Eye Near:    Bilateral Near:     Physical Exam Constitutional:      General: She is not in acute distress.    Appearance: She is well-developed.  Cardiovascular:     Rate and Rhythm: Normal rate.  Pulmonary:     Effort: Pulmonary effort is normal.  Skin:    General: Skin is warm and dry.  Neurological:     Mental Status: She is alert and oriented to person, place, and time.      UC Treatments / Results  Labs (all labs ordered are listed, but only abnormal results are displayed) Labs Reviewed  SARS CORONAVIRUS 2 (TAT 6-24 HRS)    EKG   Radiology No results found.  Procedures Procedures (including critical care time)  Medications Ordered in UC Medications - No data to display  Initial Impression / Assessment and Plan / UC Course  I have reviewed the triage vital signs and the nursing notes.  Pertinent labs & imaging results that were available during my care of the patient were reviewed by me and considered in my medical decision making (see chart for details).     Non toxic. Benign physical exam.  History and physical consistent  with viral illness.  Supportive cares recommended. covid testing collected and pending. Return precautions provided. Patient verbalized understanding and agreeable to plan.   Final Clinical Impressions(s) / UC Diagnoses   Final diagnoses:  Upper respiratory tract infection, unspecified type     Discharge Instructions     Self isolate until covid results are back and negative.  Will notify you by phone of any positive findings. Your negative results will be sent through your MyChart.        ED Prescriptions    None     PDMP not reviewed this encounter.   Zigmund Gottron, NP 08/02/20 1420

## 2020-08-01 NOTE — ED Triage Notes (Signed)
Pt presents for COVID testing. States having cough and nausea x 2-3 days. Denies, fever, chills.

## 2020-08-01 NOTE — Discharge Instructions (Signed)
Self isolate until covid results are back and negative.  °Will notify you by phone of any positive findings. Your negative results will be sent through your MyChart.     ° °

## 2020-08-02 LAB — SARS CORONAVIRUS 2 (TAT 6-24 HRS): SARS Coronavirus 2: NEGATIVE

## 2020-11-19 ENCOUNTER — Ambulatory Visit
Admission: EM | Admit: 2020-11-19 | Discharge: 2020-11-19 | Disposition: A | Discharge status: Home / Self Care | Payer: BC Managed Care – PPO | Attending: Urgent Care | Admitting: Urgent Care

## 2020-11-19 ENCOUNTER — Other Ambulatory Visit: Payer: Self-pay

## 2020-11-19 DIAGNOSIS — Z202 Contact with and (suspected) exposure to infections with a predominantly sexual mode of transmission: Secondary | ICD-10-CM | POA: Diagnosis present

## 2020-11-19 DIAGNOSIS — R21 Rash and other nonspecific skin eruption: Secondary | ICD-10-CM | POA: Insufficient documentation

## 2020-11-19 DIAGNOSIS — L732 Hidradenitis suppurativa: Secondary | ICD-10-CM

## 2020-11-19 MED ORDER — MUPIROCIN 2 % EX OINT
1.0000 | TOPICAL_OINTMENT | Freq: Three times a day (TID) | CUTANEOUS | 0 refills | Status: DC
Start: 2020-11-19 — End: 2022-03-09

## 2020-11-19 MED ORDER — TRIAMCINOLONE ACETONIDE 0.1 % EX CREA
1.0000 | TOPICAL_CREAM | Freq: Two times a day (BID) | CUTANEOUS | 0 refills | Status: DC
Start: 2020-11-19 — End: 2022-03-09

## 2020-11-19 MED ORDER — DOXYCYCLINE HYCLATE 100 MG PO CAPS
100.0000 mg | ORAL_CAPSULE | Freq: Two times a day (BID) | ORAL | 0 refills | Status: DC
Start: 2020-11-19 — End: 2022-03-09

## 2020-11-19 MED ORDER — AZITHROMYCIN 500 MG PO TABS: 1000.0000 mg | ORAL_TABLET | Freq: Once | ORAL | Status: DC

## 2020-11-19 NOTE — Discharge Instructions (Addendum)
Avoid all forms of sexual intercourse (oral, vaginal, anal) for the next 7 days to avoid spreading/reinfecting. Return if symptoms worsen/do not resolve, you develop fever, abdominal pain, blood in your urine, or are re-exposed to an STI.   Apply the steroid cream, triamcinolone to the areas of hidradenitis twice a day for a week.  Use light layers.  You can use Bactroban 3 times a day to address infection of your hidradenitis.  Make sure you follow-up with your dermatologist ASAP.

## 2020-11-19 NOTE — ED Provider Notes (Signed)
[place]   MRN: [id number] DOB: 1991-07-02  Subjective:   Jessica Vasquez is a 29 y.o. female presenting for 3-day history of recurrent boils over her right medial thigh. Patient also has a history of hidradenitis. She does see a dermatologist. States that she is supposed to be on Humira but has not wanted to do this. Denies fever, nausea, vomiting, pelvic pain. Does not have any vaginal discharge. She does report exposure to chlamydia. Had sex with 1 female and did not use condoms for protection, he turned out to have chlamydia and patient was informed this yesterday. She is currently breast-feeding.  No current facility-administered medications for this encounter.  Current Outpatient Medications:  .  Blood Pressure Monitoring KIT, 1 kit by Does not apply route once a week. (Patient not taking: Reported on 11/09/2019), Disp: 1 kit, Rfl: 0 .  Butalbital-APAP-Caffeine 50-325-40 MG capsule, Take 1-2 capsules by mouth every 6 (six) hours as needed for headache. (Patient not taking: Reported on 11/09/2019), Disp: 30 capsule, Rfl: 3 .  cetirizine-pseudoephedrine (ZYRTEC-D) 5-120 MG tablet, Take 1 tablet by mouth as needed for allergies., Disp: , Rfl:  .  enalapril (VASOTEC) 10 MG tablet, Take 1 tablet (10 mg total) by mouth daily., Disp: 90 tablet, Rfl: 0 .  ferrous sulfate 325 (65 FE) MG EC tablet, Take 1 tablet (325 mg total) by mouth 3 (three) times daily with meals. (Patient not taking: Reported on 11/09/2019), Disp: 90 tablet, Rfl: 3 .  ibuprofen (ADVIL) 600 MG tablet, Take 1 tablet (600 mg total) by mouth every 6 (six) hours as needed for headache, mild pain, moderate pain or cramping. (Patient not taking: Reported on 12/03/2019), Disp: 30 tablet, Rfl: 2 .  oxyCODONE (OXY IR/ROXICODONE) 5 MG immediate release tablet, Take 1 tablet (5 mg total) by mouth every 6 (six) hours as needed for moderate pain, severe pain or breakthrough pain. (Patient not taking: Reported on 11/09/2019), Disp:  30 tablet, Rfl: 0 .  pantoprazole (PROTONIX) 40 MG tablet, TAKE 1 TABLET BY MOUTH EVERY DAY, Disp: 30 tablet, Rfl: 1 .  Prenatal Vit-Fe Fumarate-FA (PREPLUS) 27-1 MG TABS, Take 1 tablet by mouth daily. (Patient not taking: Reported on 11/09/2019), Disp: 30 tablet, Rfl: 13 .  senna-docusate (SENOKOT-S) 8.6-50 MG tablet, Take 2 tablets by mouth 2 (two) times daily as needed for mild constipation or moderate constipation. (Patient not taking: Reported on 11/09/2019), Disp: 30 tablet, Rfl: 2   No Known Allergies  Past Medical History:  Diagnosis Date  . [redacted] weeks gestation of pregnancy 10/31/2019  . Anemia   . Asthma   . Cholelithiasis   . Gestational hypertension 09/30/2019   Noted at 28 week visit Will need delivery @ 37 weeks  . Group beta Strep positive 10/23/2019  . Headache   . Herpes    last outbreak two weeks ago ( mid march 2015)  . Hydradenitis   . Non-reassuring fetal heart tones complicating pregnancy, antepartum 10/31/2019  . Preeclampsia 10/14/2019   No severe features  Guidelines for Antenatal Testing and Sonography  (with updated ICD-10 codes)  Updated  07-18-2019 with Dr. Tama High  INDICATION U/S 2 X week NST/AFI  or full BPP wkly DELIVERY Diabetes   A1 - good control - O24.410    A2 - good control - O24.419      A2  - poor control or poor compliance - O24.419, E11.65   (Macrosomia or polyhydramnios) **E11.65 is extra code for poor contr  . Sleep apnea   .  Supervision of high-risk pregnancy 06/09/2019    Nursing Staff Provider Office Location  [facility] Dating  LMP/1st trimester [place]  Language   ENGLISH Anatomy US  Normal with limited views> wnl Flu Vaccine  Declined 09/30/19 Genetic Screen  NIPS: low risk, female    AFP: low risk TDaP vaccine   declined 09/30/19 Hgb A1C or  GTT Early  Third trimester  Rhogam   NA, AB+   LAB RESULTS  Feeding Plan  Breastfeed Blood Type AB/Positive/-- (07/08 1333)   . Trichimoniasis 01/14/2019     Past Surgical History:  Procedure Laterality  Date  . CESAREAN SECTION N/A 10/31/2019   Procedure: CESAREAN SECTION;  Surgeon: Aletha Halim, MD;  Location: [facility];  Service: Obstetrics;  Laterality: N/A;  . CHOLECYSTECTOMY N/A 10/15/2015   Procedure: LAPAROSCOPIC CHOLECYSTECTOMY WITH INTRAOPERATIVE CHOLANGIOGRAM;  Surgeon: Donnie Mesa, MD;  Location: [place];  Service: General;  Laterality: N/A;  . ERCP N/A 10/16/2015   Procedure: ENDOSCOPIC RETROGRADE CHOLANGIOPANCREATOGRAPHY (ERCP);  Surgeon: Gatha Mayer, MD;  Location: [facility];  Service: Endoscopy;  Laterality: N/A;  . nasal sugery    . TUBAL LIGATION Bilateral 10/31/2019   Procedure: BILATERAL TUBAL LIGATION;  Surgeon: Aletha Halim, MD;  Location: [facility];  Service: Obstetrics;  Laterality: Bilateral;    Family History  Problem Relation Age of Onset  . Gallstones Mother   . Arthritis Mother   . ADD / ADHD Brother   . Diabetes Maternal Grandmother   . Heart disease Maternal Grandmother   . Hypertension Maternal Grandmother   . Stroke Maternal Grandmother     Social History   Tobacco Use  . Smoking status: Former Smoker    Quit date: 05/16/2011    Years since quitting: 9.5  . Smokeless tobacco: Never Used  Vaping Use  . Vaping Use: Never used  Substance Use Topics  . Alcohol use: No  . Drug use: No    ROS   Objective:   Vitals: BP 107/87 (BP Location: Left Arm)   Pulse 84   Temp 97.9 F (36.6 C) (Oral)   Resp 18   LMP 11/03/2020 (Approximate)   SpO2 98%   Physical Exam Constitutional:      General: She is not in acute distress.    Appearance: Normal appearance. She is well-developed. She is obese. She is not ill-appearing, toxic-appearing or diaphoretic.  HENT:     Head: Normocephalic and atraumatic.     Nose: Nose normal.     Mouth/Throat:     Mouth: Mucous membranes are moist.     Pharynx: Oropharynx is clear.  Eyes:     General: No scleral icterus.       Right eye: No discharge.        Left eye: No discharge.      Extraocular Movements: Extraocular movements intact.     Conjunctiva/sclera: Conjunctivae normal.     Pupils: Pupils are equal, round, and reactive to light.  Cardiovascular:     Rate and Rhythm: Normal rate.  Pulmonary:     Effort: Pulmonary effort is normal.  Skin:    General: Skin is warm and dry.       Neurological:     General: No focal deficit present.     Mental Status: She is alert and oriented to person, place, and time.  Psychiatric:        Mood and Affect: Mood normal.        Behavior: Behavior normal.  Thought Content: Thought content normal.        Judgment: Judgment normal.      Assessment and Plan :   PDMP not reviewed this encounter.  1. Hidradenitis   2. Rash and nonspecific skin eruption   3. Exposure to chlamydia     Recommended using triamcinolone, mupirocin as patient has significant concern for infection hidradenitis. Emphasized need for follow-up with her PCP. As per CDC guidelines and the RID and up-to-date, will use doxycycline to cover for chlamydia exposure. Patient has tubal ligation and therefore deferred pregnancy test. Counseled on safe sex practices including abstaining for 1 week following treatment.  Counseled patient on potential for adverse effects with medications prescribed/recommended today, ER and return-to-clinic precautions discussed, patient verbalized understanding.    Jaynee Eagles, PA-C 11/19/20 1119

## 2020-11-19 NOTE — ED Triage Notes (Signed)
Patient states she has a boil/abcess on her right inner thigh near her groin area that has been there x 3 days. Pt also would like to be tested for STIs due to her partner telling her that he tested positive for chlamydia. Pt is aox4 and ambulatory.

## 2020-11-22 ENCOUNTER — Telehealth (HOSPITAL_COMMUNITY): Payer: Self-pay | Admitting: Emergency Medicine

## 2020-11-22 LAB — CERVICOVAGINAL ANCILLARY ONLY
Bacterial Vaginitis (gardnerella): POSITIVE — AB
Candida Glabrata: POSITIVE — AB
Candida Vaginitis: NEGATIVE
Chlamydia: POSITIVE — AB
Comment: NEGATIVE
Comment: NEGATIVE
Comment: NEGATIVE
Comment: NEGATIVE
Comment: NEGATIVE
Comment: NORMAL
Neisseria Gonorrhea: NEGATIVE
Trichomonas: NEGATIVE

## 2020-11-22 MED ORDER — TERCONAZOLE 0.4 % VA CREA: 1.0000 | TOPICAL_CREAM | Freq: Every day | VAGINAL | 0 refills | Status: AC

## 2020-11-22 MED ORDER — METRONIDAZOLE 0.75 % VA GEL: 1.0000 | Freq: Every day | VAGINAL | 0 refills | Status: AC

## 2020-11-22 MED ORDER — AZITHROMYCIN 250 MG PO TABS
1000.0000 mg | ORAL_TABLET | Freq: Once | ORAL | 0 refills | Status: AC
Start: 2020-11-22 — End: 2020-11-22

## 2020-11-22 NOTE — Telephone Encounter (Signed)
Patient called and states her baby is developing diarrhea with doxycycline (patient is breastfeeding).  Reviewed with Urban Gibson, APP who gave verbal for azithromycin one time dose.  LVM informing patient

## 2021-02-21 ENCOUNTER — Other Ambulatory Visit: Payer: Self-pay

## 2021-11-23 ENCOUNTER — Other Ambulatory Visit: Payer: Self-pay

## 2021-11-23 ENCOUNTER — Emergency Department (HOSPITAL_COMMUNITY): Payer: BC Managed Care – PPO

## 2021-11-23 ENCOUNTER — Encounter (HOSPITAL_COMMUNITY): Payer: Self-pay | Admitting: Emergency Medicine

## 2021-11-23 ENCOUNTER — Emergency Department (HOSPITAL_COMMUNITY)
Admission: EM | Admit: 2021-11-23 | Discharge: 2021-11-23 | Disposition: A | Discharge status: Home / Self Care | Payer: BC Managed Care – PPO | Attending: Emergency Medicine | Admitting: Emergency Medicine

## 2021-11-23 DIAGNOSIS — R0789 Other chest pain: Secondary | ICD-10-CM | POA: Insufficient documentation

## 2021-11-23 DIAGNOSIS — R079 Chest pain, unspecified: Secondary | ICD-10-CM

## 2021-11-23 DIAGNOSIS — Z87891 Personal history of nicotine dependence: Secondary | ICD-10-CM | POA: Insufficient documentation

## 2021-11-23 DIAGNOSIS — N939 Abnormal uterine and vaginal bleeding, unspecified: Secondary | ICD-10-CM | POA: Insufficient documentation

## 2021-11-23 DIAGNOSIS — N9489 Other specified conditions associated with female genital organs and menstrual cycle: Secondary | ICD-10-CM | POA: Diagnosis not present

## 2021-11-23 DIAGNOSIS — J45909 Unspecified asthma, uncomplicated: Secondary | ICD-10-CM | POA: Diagnosis not present

## 2021-11-23 LAB — CBC
HCT: 37.6 % (ref 36.0–46.0)
Hemoglobin: 12.2 g/dL (ref 12.0–15.0)
MCH: 28.3 pg (ref 26.0–34.0)
MCHC: 32.4 g/dL (ref 30.0–36.0)
MCV: 87.2 fL (ref 80.0–100.0)
Platelets: 284 10*3/uL (ref 150–400)
RBC: 4.31 MIL/uL (ref 3.87–5.11)
RDW: 13.2 % (ref 11.5–15.5)
WBC: 5.6 10*3/uL (ref 4.0–10.5)
nRBC: 0 % (ref 0.0–0.2)

## 2021-11-23 LAB — TROPONIN I (HIGH SENSITIVITY)
Troponin I (High Sensitivity): 3 ng/L (ref <18)
Troponin I (High Sensitivity): <2 ng/L (ref <18)

## 2021-11-23 LAB — I-STAT BETA HCG BLOOD, ED (MC, WL, AP ONLY): I-stat hCG, quantitative: 8.7 m[IU]/mL — ABNORMAL HIGH (ref <5)

## 2021-11-23 LAB — BASIC METABOLIC PANEL
Anion gap: 9 (ref 5–15)
BUN: 7 mg/dL (ref 6–20)
CO2: 23 mmol/L (ref 22–32)
Calcium: 8.8 mg/dL — ABNORMAL LOW (ref 8.9–10.3)
Chloride: 105 mmol/L (ref 98–111)
Creatinine, Ser: 0.78 mg/dL (ref 0.44–1.00)
GFR, Estimated: >60 mL/min (ref >60)
Glucose, Bld: 96 mg/dL (ref 70–99)
Potassium: 4 mmol/L (ref 3.5–5.1)
Sodium: 137 mmol/L (ref 135–145)

## 2021-11-23 NOTE — Discharge Instructions (Signed)
Follow up with your primary doctor or OB GYN doctor regarding your irregular vaginal bleeding.  Return to the ED as needed for worsening symptoms

## 2021-11-23 NOTE — ED Triage Notes (Signed)
Patient complains of intermittent chest tightness for the last three days and vaginal bleeding for three weeks. Denies nausea and shortness of breath. Patient also reports generalized weakness four the last several days. Patient alert, oriented, ambulatory, speaking in complete sentences, and in no apparent distress at this time.

## 2021-11-23 NOTE — ED Provider Notes (Signed)
[facility] Provider Note   CSN: [id number] Arrival date & time: 11/23/21  0746     History Chief Complaint  Patient presents with   Chest Pain    Jessica Vasquez is a 30 y.o. female.   Chest Pain  HPI: A 30 year old patient presents for evaluation of chest pain. Initial onset of pain was approximately 1-3 hours ago. The patient's chest pain is sharp and is not worse with exertion. The patient's chest pain is middle- or left-sided, is not well-localized, is not described as heaviness/pressure/tightness and does not radiate to the arms/jaw/neck. The patient does not complain of nausea and denies diaphoresis. The patient has no history of stroke, has no history of peripheral artery disease, has not smoked in the past 90 days, denies any history of treated diabetes, has no relevant family history of coronary artery disease (first degree relative at less than age 74), is not hypertensive, has no history of hypercholesterolemia and does not have an elevated BMI (>=30).  Patient's chest pain is intermittent and lasts less than a minute at a time.    Patient also states she has had irregular menstrual bleeding for the last couple of weeks.  It has never stopped.  Patient has had a tubal ligation in the past.  She does not believe that she is pregnant. Past Medical History:  Diagnosis Date   [redacted] weeks gestation of pregnancy 10/31/2019   Anemia    Asthma    Cholelithiasis    Gestational hypertension 09/30/2019   Noted at 28 week visit Will need delivery @ 37 weeks   Group beta Strep positive 10/23/2019   Headache    Herpes    last outbreak two weeks ago ( mid march 2015)   Hydradenitis    Non-reassuring fetal heart tones complicating pregnancy, antepartum 10/31/2019   Preeclampsia 10/14/2019   No severe features  Guidelines for Antenatal Testing and Sonography  (with updated ICD-10 codes)  Updated  01-Aug-2019 with Dr. Tama High  INDICATION U/S 2 X week  NST/AFI  or full BPP wkly DELIVERY Diabetes   A1 - good control - O24.410    A2 - good control - O24.419      A2  - poor control or poor compliance - O24.419, E11.65   (Macrosomia or polyhydramnios) **E11.65 is extra code for poor contr   Sleep apnea    Supervision of high-risk pregnancy 06/09/2019    Nursing Staff Provider Office Location  [facility] Dating  LMP/1st trimester [place]  Language   ENGLISH Anatomy US  Normal with limited views> wnl Flu Vaccine  Declined 09/30/19 Genetic Screen  NIPS: low risk, female    AFP: low risk TDaP vaccine   declined 09/30/19 Hgb A1C or  GTT Early  Third trimester  Rhogam   NA, AB+   LAB RESULTS  Feeding Plan  Breastfeed Blood Type AB/Positive/-- (07/08 1333)    Trichimoniasis 01/14/2019    Patient Active Problem List   Diagnosis Date Noted   S/P cesarean section 11/04/2019   Severe preeclampsia, delivered 10/21/2019   GDM (gestational diabetes mellitus) 10/01/2019   Snoring 08/06/2019   Alpha thalassemia silent carrier 08/05/2019   Chronic nasal congestion 05/07/2019   Nasal polyp 05/07/2019   Anemia 10/20/2013   HSV (herpes simplex virus) infection 10/20/2013    Past Surgical History:  Procedure Laterality Date   CESAREAN SECTION N/A 10/31/2019   Procedure: CESAREAN SECTION;  Surgeon: Aletha Halim, MD;  Location: [facility];  Service:  Obstetrics;  Laterality: N/A;   CHOLECYSTECTOMY N/A 10/15/2015   Procedure: LAPAROSCOPIC CHOLECYSTECTOMY WITH INTRAOPERATIVE CHOLANGIOGRAM;  Surgeon: Donnie Mesa, MD;  Location: [place];  Service: General;  Laterality: N/A;   ERCP N/A 10/16/2015   Procedure: ENDOSCOPIC RETROGRADE CHOLANGIOPANCREATOGRAPHY (ERCP);  Surgeon: Gatha Mayer, MD;  Location: [facility];  Service: Endoscopy;  Laterality: N/A;   nasal sugery     TUBAL LIGATION Bilateral 10/31/2019   Procedure: BILATERAL TUBAL LIGATION;  Surgeon: Aletha Halim, MD;  Location: [facility];  Service: Obstetrics;  Laterality: Bilateral;     OB History      Gravida  4   Para  3   Term  2   Preterm  1   AB  1   Living  3      SAB  1   IAB  0   Ectopic  0   Multiple  0   Live Births  3           Family History  Problem Relation Age of Onset   Gallstones Mother    Arthritis Mother    ADD / ADHD Brother    Diabetes Maternal Grandmother    Heart disease Maternal Grandmother    Hypertension Maternal Grandmother    Stroke Maternal Grandmother     Social History   Tobacco Use   Smoking status: Former    Types: Cigarettes    Quit date: 05/16/2011    Years since quitting: 10.5   Smokeless tobacco: Never  Vaping Use   Vaping Use: Never used  Substance Use Topics   Alcohol use: No   Drug use: No    Home Medications Prior to Admission medications   Medication Sig Start Date End Date Taking? Authorizing Provider  Blood Pressure Monitoring KIT 1 kit by Does not apply route once a week. Patient not taking: Reported on 11/09/2019 06/24/19   Tresea Mall, CNM  Butalbital-APAP-Caffeine [phone] MG capsule Take 1-2 capsules by mouth every 6 (six) hours as needed for headache. Patient not taking: Reported on 11/09/2019 07/10/19   Seabron Spates, CNM  doxycycline (VIBRAMYCIN) 100 MG capsule Take 1 capsule (100 mg total) by mouth 2 (two) times daily. Patient not taking: Reported on 11/23/2021 11/19/20   Jaynee Eagles, PA-C  enalapril (VASOTEC) 10 MG tablet Take 1 tablet (10 mg total) by mouth daily. Patient not taking: Reported on 11/23/2021 01/25/20 03/25/20  Constant, Peggy, MD  ferrous sulfate 325 (65 FE) MG EC tablet Take 1 tablet (325 mg total) by mouth 3 (three) times daily with meals. Patient not taking: Reported on 11/09/2019 09/30/19   Sloan Leiter, MD  ibuprofen (ADVIL) 600 MG tablet Take 1 tablet (600 mg total) by mouth every 6 (six) hours as needed for headache, mild pain, moderate pain or cramping. Patient not taking: Reported on 12/03/2019 11/04/19   Osborne Oman, MD  mupirocin ointment (BACTROBAN) 2 %  Apply 1 application topically 3 (three) times daily. Patient not taking: Reported on 11/23/2021 11/19/20   Jaynee Eagles, PA-C  oxyCODONE (OXY IR/ROXICODONE) 5 MG immediate release tablet Take 1 tablet (5 mg total) by mouth every 6 (six) hours as needed for moderate pain, severe pain or breakthrough pain. Patient not taking: Reported on 11/09/2019 11/04/19   Osborne Oman, MD  pantoprazole (PROTONIX) 40 MG tablet TAKE 1 TABLET BY MOUTH EVERY DAY Patient not taking: Reported on 11/23/2021 01/04/20   Woodroe Mode, MD  Prenatal Vit-Fe Fumarate-FA (PREPLUS) 27-1 MG TABS  Take 1 tablet by mouth daily. Patient not taking: Reported on 11/09/2019 01/30/19   Darlina Rumpf, CNM  senna-docusate (SENOKOT-S) 8.6-50 MG tablet Take 2 tablets by mouth 2 (two) times daily as needed for mild constipation or moderate constipation. Patient not taking: Reported on 11/09/2019 11/04/19   Anyanwu, Sallyanne Havers, MD  triamcinolone (KENALOG) 0.1 % Apply 1 application topically 2 (two) times daily. Patient not taking: Reported on 11/23/2021 11/19/20   Jaynee Eagles, PA-C    Allergies    Patient has no known allergies.  Review of Systems   Review of Systems  Cardiovascular:  Positive for chest pain.  All other systems reviewed and are negative.  Physical Exam Updated Vital Signs BP 108/74   Pulse 73   Temp 98.9 F (37.2 C) (Oral)   Resp 14   LMP 11/09/2021 (Approximate)   SpO2 100%   Physical Exam Vitals and nursing note reviewed.  Constitutional:      General: She is not in acute distress.    Appearance: She is well-developed.  HENT:     Head: Normocephalic and atraumatic.     Right Ear: External ear normal.     Left Ear: External ear normal.  Eyes:     General: No scleral icterus.       Right eye: No discharge.        Left eye: No discharge.     Conjunctiva/sclera: Conjunctivae normal.  Neck:     Trachea: No tracheal deviation.  Cardiovascular:     Rate and Rhythm: Normal rate and regular rhythm.   Pulmonary:     Effort: Pulmonary effort is normal. No respiratory distress.     Breath sounds: Normal breath sounds. No stridor. No wheezing or rales.  Abdominal:     General: Bowel sounds are normal. There is no distension.     Palpations: Abdomen is soft.     Tenderness: There is no abdominal tenderness. There is no guarding or rebound.  Musculoskeletal:        General: No tenderness or deformity.     Cervical back: Neck supple.  Skin:    General: Skin is warm and dry.     Findings: No rash.  Neurological:     General: No focal deficit present.     Mental Status: She is alert.     Cranial Nerves: No cranial nerve deficit (no facial droop, extraocular movements intact, no slurred speech).     Sensory: No sensory deficit.     Motor: No abnormal muscle tone or seizure activity.     Coordination: Coordination normal.  Psychiatric:        Mood and Affect: Mood normal.    ED Results / Procedures / Treatments   Labs (all labs ordered are listed, but only abnormal results are displayed) Labs Reviewed  BASIC METABOLIC PANEL - Abnormal; Notable for the following components:      Result Value   Calcium 8.8 (*)    All other components within normal limits  I-STAT BETA HCG BLOOD, ED ([facility], [facility], [facility] ONLY) - Abnormal; Notable for the following components:   I-stat hCG, quantitative 8.7 (*)    All other components within normal limits  CBC  HCG, SERUM, QUALITATIVE  TROPONIN I (HIGH SENSITIVITY)  TROPONIN I (HIGH SENSITIVITY)    EKG EKG Interpretation  Date/Time:  Thursday November 23 2021 07:55:15 EST Ventricular Rate:  80 PR Interval:  170 QRS Duration: 82 QT Interval:  376 QTC Calculation: 433 R Axis:  57 Text Interpretation: Normal sinus rhythm Nonspecific T wave abnormality Abnormal ECG No old tracing to compare Confirmed by Dorie Rank [phone]) on 11/23/2021 4:28:13 PM  Radiology DG Chest 2 View  Result Date: 11/23/2021 CLINICAL DATA:  Chest pain and weakness for 2 weeks.  EXAM: CHEST - 2 VIEW COMPARISON:  10/10/2018 FINDINGS: The heart size and mediastinal contours are within normal limits. Both lungs are clear. The visualized skeletal structures are unremarkable. IMPRESSION: No active cardiopulmonary disease. Electronically Signed   By: Marlaine Hind M.D.   On: 11/23/2021 08:15    Procedures Procedures   Medications Ordered in ED Medications - No data to display  ED Course  I have reviewed the triage vital signs and the nursing notes.  Pertinent labs & imaging results that were available during my care of the patient were reviewed by me and considered in my medical decision making (see chart for details).    MDM Rules/Calculators/A&P HEAR Score: 0                         Patient presented with complaints of chest pain.  Symptoms atypical for acute coronary syndrome.  Serial troponins and EKG were reassuring.  Doubt symptoms are cardiac related.  No complaints of shortness of breath.  Low risk for PE and PERC negative.  I doubt pulmonary embolism.  Etiology of her symptoms unclear but overall reassuring work-up.  Stable for outpatient follow-up.  Patient also complained of irregular vaginal bleeding.  She has had a tubal ligation.  Her hCG was slightly elevated 8.7 but I suspect this is a false positive.  Patient is not having any pain.  Recommend outpatient follow-up with OB/GYN.  She is not anemic and not having any heavy bleeding at this time. Final Clinical Impression(s) / ED Diagnoses Final diagnoses:  Chest pain, unspecified type  Vaginal bleeding    Rx / DC Orders ED Discharge Orders     None        Dorie Rank, MD 11/23/21 1949

## 2021-11-23 NOTE — ED Notes (Signed)
RN reviewed discharge instructions with pt. Pt verbalized understanding and had no further questions. VSS upon discharge.  

## 2022-03-09 ENCOUNTER — Ambulatory Visit
Admission: EM | Admit: 2022-03-09 | Discharge: 2022-03-09 | Disposition: A | Discharge status: Home / Self Care | Payer: Medicaid Other | Attending: Emergency Medicine | Admitting: Emergency Medicine

## 2022-03-09 DIAGNOSIS — H66001 Acute suppurative otitis media without spontaneous rupture of ear drum, right ear: Secondary | ICD-10-CM | POA: Diagnosis not present

## 2022-03-09 DIAGNOSIS — J3089 Other allergic rhinitis: Secondary | ICD-10-CM | POA: Diagnosis not present

## 2022-03-09 DIAGNOSIS — J310 Chronic rhinitis: Secondary | ICD-10-CM

## 2022-03-09 DIAGNOSIS — J329 Chronic sinusitis, unspecified: Secondary | ICD-10-CM

## 2022-03-09 DIAGNOSIS — J302 Other seasonal allergic rhinitis: Secondary | ICD-10-CM

## 2022-03-09 MED ORDER — FLUTICASONE PROPIONATE 50 MCG/ACT NA SUSP: 2.0000 | Freq: Every day | NASAL | 1 refills | Status: AC

## 2022-03-09 MED ORDER — FLUOCINOLONE ACETONIDE 0.01 % OT OIL: 5.0000 [drp] | TOPICAL_OIL | Freq: Two times a day (BID) | OTIC | 0 refills | Status: AC | PRN

## 2022-03-09 MED ORDER — OLOPATADINE HCL 0.1 % OP SOLN: 1.0000 [drp] | Freq: Two times a day (BID) | OPHTHALMIC | 1 refills | Status: AC

## 2022-03-09 MED ORDER — METHYLPREDNISOLONE SODIUM SUCC 125 MG IJ SOLR
125.0000 mg | Freq: Once | INTRAMUSCULAR | Status: AC
  Administered 2022-03-09: 125 mg via INTRAMUSCULAR

## 2022-03-09 MED ORDER — IPRATROPIUM BROMIDE 0.06 % NA SOLN: 2.0000 | Freq: Four times a day (QID) | NASAL | 1 refills | Status: AC

## 2022-03-09 MED ORDER — AMOXICILLIN-POT CLAVULANATE 875-125 MG PO TABS: 1.0000 | ORAL_TABLET | Freq: Two times a day (BID) | ORAL | 0 refills | Status: AC

## 2022-03-09 MED ORDER — CETIRIZINE HCL 10 MG PO TABS: 10.0000 mg | ORAL_TABLET | Freq: Every day | ORAL | 1 refills | Status: AC

## 2022-03-09 MED ORDER — LANSOPRAZOLE 30 MG PO CPDR: 30.0000 mg | DELAYED_RELEASE_CAPSULE | Freq: Every day | ORAL | 2 refills | Status: AC

## 2022-03-09 NOTE — ED Provider Notes (Addendum)
[facility]    CSN: [id number] Arrival date & time: 03/09/22  1023    HISTORY   Chief Complaint  Patient presents with   Nasal Congestion   Sore Throat   Headache   HPI Jessica Vasquez is a 31 y.o. female. Pt c/o sore throat, headache, itchy eyes, congestion/ runny nose that began yesterday.  Patient states she has significant allergies which have been significantly worse for the past 7 days however yesterday is when she began to feel really sick.  Patient states she has a significant amount of pressure in her face, particular on the right side.  Patient states both of her ears have been itchy and her eyes have been red, itchy and burning.  Patient states she is also felt fatigue and body aches.  Patient denies difficulty swallowing, changes in her voice.  Patient denies nausea, vomiting, diarrhea.  Patient denies known sick contacts.  The history is provided by the patient.  Past Medical History:  Diagnosis Date   [redacted] weeks gestation of pregnancy 10/31/2019   Anemia    Asthma    Cholelithiasis    Gestational hypertension 09/30/2019   Noted at 28 week visit Will need delivery @ 37 weeks   Group beta Strep positive 10/23/2019   Headache    Herpes    last outbreak two weeks ago ( mid march 2015)   Hydradenitis    Non-reassuring fetal heart tones complicating pregnancy, antepartum 10/31/2019   Preeclampsia 10/14/2019   No severe features  Guidelines for Antenatal Testing and Sonography  (with updated ICD-10 codes)  Updated  2019/07/19 with Dr. Noralee Space  INDICATION U/S 2 X week NST/AFI  or full BPP wkly DELIVERY Diabetes   A1 - good control - O24.410    A2 - good control - O24.419      A2  - poor control or poor compliance - O24.419, E11.65   (Macrosomia or polyhydramnios) **E11.65 is extra code for poor contr   Sleep apnea    Supervision of high-risk pregnancy 06/09/2019    Nursing Staff Provider Office Location  [facility] Dating  LMP/1st trimester [place]  Language    ENGLISH Anatomy US  Normal with limited views> wnl Flu Vaccine  Declined 09/30/19 Genetic Screen  NIPS: low risk, female    AFP: low risk TDaP vaccine   declined 09/30/19 Hgb A1C or  GTT Early  Third trimester  Rhogam   NA, AB+   LAB RESULTS  Feeding Plan  Breastfeed Blood Type AB/Positive/-- (07/08 1333)    Trichimoniasis 01/14/2019   Patient Active Problem List   Diagnosis Date Noted   S/P cesarean section 11/04/2019   Severe preeclampsia, delivered 10/21/2019   GDM (gestational diabetes mellitus) 10/01/2019   Snoring 08/06/2019   Alpha thalassemia silent carrier 08/05/2019   Chronic nasal congestion 05/07/2019   Nasal polyp 05/07/2019   Anemia 10/20/2013   HSV (herpes simplex virus) infection 10/20/2013   Past Surgical History:  Procedure Laterality Date   CESAREAN SECTION N/A 10/31/2019   Procedure: CESAREAN SECTION;  Surgeon: [place] Bing, MD;  Location: [facility]S;  Service: Obstetrics;  Laterality: N/A;   CHOLECYSTECTOMY N/A 10/15/2015   Procedure: LAPAROSCOPIC CHOLECYSTECTOMY WITH INTRAOPERATIVE CHOLANGIOGRAM;  Surgeon: Manus Rudd, MD;  Location: [facility] OR;  Service: General;  Laterality: N/A;   ERCP N/A 10/16/2015   Procedure: ENDOSCOPIC RETROGRADE CHOLANGIOPANCREATOGRAPHY (ERCP);  Surgeon: Iva Boop, MD;  Location: [facility] ENDOSCOPY;  Service: Endoscopy;  Laterality: N/A;   nasal sugery  TUBAL LIGATION Bilateral 10/31/2019   Procedure: BILATERAL TUBAL LIGATION;  Surgeon: [place] Bing, MD;  Location: [facility];  Service: Obstetrics;  Laterality: Bilateral;   OB History     Gravida  4   Para  3   Term  2   Preterm  1   AB  1   Living  3      SAB  1   IAB  0   Ectopic  0   Multiple  0   Live Births  3          Home Medications    Prior to Admission medications   Medication Sig Start Date End Date Taking? Authorizing Provider   Family History Family History  Problem Relation Age of Onset   Gallstones Mother    Arthritis Mother    ADD /  ADHD Brother    Diabetes Maternal Grandmother    Heart disease Maternal Grandmother    Hypertension Maternal Grandmother    Stroke Maternal Grandmother    Social History Social History   Tobacco Use   Smoking status: Former    Types: Cigarettes    Quit date: 05/16/2011    Years since quitting: 10.8   Smokeless tobacco: Never  Vaping Use   Vaping Use: Never used  Substance Use Topics   Alcohol use: No   Drug use: No   Allergies   Patient has no known allergies.  Review of Systems Review of Systems Pertinent findings noted in history of present illness.   Physical Exam Triage Vital Signs ED Triage Vitals  Enc Vitals Group     BP 10/13/21 0827 (!) 147/82     Pulse Rate 10/13/21 0827 72     Resp 10/13/21 0827 18     Temp 10/13/21 0827 98.3 F (36.8 C)     Temp Source 10/13/21 0827 Oral     SpO2 10/13/21 0827 98 %     Weight --      Height --      Head Circumference --      Peak Flow --      Pain Score 10/13/21 0826 5     Pain Loc --      Pain Edu? --      Excl. in GC? --   No data found.  Updated Vital Signs BP 112/79 (BP Location: Right Arm)   Pulse 95   Temp 97.6 F (36.4 C) (Oral)   Resp 18   LMP 02/15/2022   SpO2 96%   Physical Exam Vitals and nursing note reviewed.  Constitutional:      General: She is not in acute distress.    Appearance: Normal appearance. She is not ill-appearing.  HENT:     Head: Normocephalic and atraumatic.     Salivary Glands: Right salivary gland is not diffusely enlarged or tender. Left salivary gland is not diffusely enlarged or tender.     Right Ear: Ear canal and external ear normal. No drainage. A middle ear effusion (Suppurative) is present. There is no impacted cerumen. Tympanic membrane is injected, erythematous and bulging.     Left Ear: Ear canal and external ear normal. No drainage. A middle ear effusion is present. There is no impacted cerumen. Tympanic membrane is bulging. Tympanic membrane is not injected or  erythematous.     Ears:     Comments: Bilateral EACs with mild erythema, left TM bulging with clear fluid    Nose: Rhinorrhea present. No nasal deformity, septal  deviation, signs of injury, nasal tenderness, mucosal edema or congestion. Rhinorrhea is clear.     Right Nostril: Occlusion present. No foreign body, epistaxis or septal hematoma.     Left Nostril: Occlusion present. No foreign body, epistaxis or septal hematoma.     Right Turbinates: Enlarged, swollen and pale.     Left Turbinates: Enlarged, swollen and pale.     Right Sinus: No maxillary sinus tenderness or frontal sinus tenderness.     Left Sinus: No maxillary sinus tenderness or frontal sinus tenderness.      Mouth/Throat:     Lips: Pink. No lesions.     Mouth: Mucous membranes are moist. No oral lesions.     Pharynx: Oropharynx is clear. Uvula midline. Posterior oropharyngeal erythema present. No pharyngeal swelling, oropharyngeal exudate or uvula swelling.     Tonsils: No tonsillar exudate. 0 on the right. 0 on the left.     Comments: Postnasal drip Eyes:     General: Lids are normal. Allergic shiner present.        Right eye: No discharge.        Left eye: No discharge.     Extraocular Movements: Extraocular movements intact.     Conjunctiva/sclera:     Right eye: Right conjunctiva is injected. No exudate.    Left eye: Left conjunctiva is injected. No exudate. Neck:     Trachea: Trachea and phonation normal.  Cardiovascular:     Rate and Rhythm: Normal rate and regular rhythm.     Pulses: Normal pulses.     Heart sounds: Normal heart sounds. No murmur heard.   No friction rub. No gallop.  Pulmonary:     Effort: Pulmonary effort is normal. No accessory muscle usage, prolonged expiration or respiratory distress.     Breath sounds: Normal breath sounds. No stridor, decreased air movement or transmitted upper airway sounds. No decreased breath sounds, wheezing, rhonchi or rales.  Chest:     Chest wall: No tenderness.   Musculoskeletal:        General: Normal range of motion.     Cervical back: Normal range of motion and neck supple. Normal range of motion.  Lymphadenopathy:     Cervical: No cervical adenopathy.  Skin:    General: Skin is warm and dry.     Findings: No erythema or rash.  Neurological:     General: No focal deficit present.     Mental Status: She is alert and oriented to person, place, and time.  Psychiatric:        Mood and Affect: Mood normal.        Behavior: Behavior normal.    Visual Acuity Right Eye Distance:   Left Eye Distance:   Bilateral Distance:    Right Eye Near:   Left Eye Near:    Bilateral Near:     [facility] Couse / Diagnostics / Procedures:    EKG  Radiology No results found.  Procedures Procedures (including critical care time)  UC Diagnoses / Final Clinical Impressions(s)   I have reviewed the triage vital signs and the nursing notes.  Pertinent labs & imaging results that were available during my care of the patient were reviewed by me and considered in my medical decision making (see chart for details).   Final diagnoses:  Acute suppurative otitis media of right ear  Perennial allergic rhinitis with seasonal variation  Rhinosinusitis   Patient has severe allergies which have likely contributed to the infection in in her right  ear and likely infection in her right maxillary sinus.  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  This should significantly reduce her inflammation and swelling in her face as well as calm her allergies.  Patient advised to begin several versions of allergy medications including Zyrtec, Flonase, Pataday, Atrovent, Derm otic oil, prescriptions provided.  Patient also provided with a 10-day prescription for Augmentin and return precautions.  Patient provided with lansoprazole to prevent irritation of GI tract. ED  Prescriptions     Medication Sig Dispense Auth. Provider   amoxicillin-clavulanate (AUGMENTIN) 875-125 MG tablet Take 1 tablet by mouth 2 (two) times daily for 10 days. 20 tablet Theadora Rama Scales, PA-C   ipratropium (ATROVENT) 0.06 % nasal spray Place 2 sprays into both nostrils 4 (four) times daily. As needed for nasal congestion, runny nose 45 mL Theadora Rama Scales, PA-C   fluticasone (FLONASE) 50 MCG/ACT nasal spray Place 2 sprays into both nostrils daily. 48 mL Theadora Rama Scales, PA-C   cetirizine (ZYRTEC ALLERGY) 10 MG tablet Take 1 tablet (10 mg total) by mouth at bedtime. 90 tablet Theadora Rama Scales, PA-C   olopatadine (PATADAY) 0.1 % ophthalmic solution Place 1 drop into both eyes 2 (two) times daily. 5 mL Theadora Rama Scales, PA-C   Fluocinolone Acetonide (DERMOTIC) 0.01 % OIL Place 5 drops in ear(s) 2 (two) times daily as needed (Itching in ears). 20 mL Theadora Rama Scales, PA-C   lansoprazole (PREVACID) 30 MG capsule Take 1 capsule (30 mg total) by mouth daily at 12 noon. 30 capsule Theadora Rama Scales, PA-C      PDMP not reviewed this encounter.  Pending results:  Labs Reviewed - No data to display  Medications Ordered in UC: Medications  methylPREDNISolone sodium succinate (SOLU-MEDROL) 125 mg/2 mL injection 125 mg (has no administration in time range)    Disposition Upon Discharge:  Condition: stable for discharge home Home: take medications as prescribed; routine discharge instructions as discussed; follow up as advised.  Patient presented with an acute illness with associated systemic symptoms and significant discomfort requiring urgent management. In my opinion, this is a condition that a prudent lay person (someone who possesses an average knowledge of health and medicine) may potentially expect to result in complications if not addressed urgently such as respiratory distress, impairment of bodily function or dysfunction of bodily organs.    Routine symptom specific, illness specific and/or disease specific instructions were discussed with the patient and/or caregiver at length.   As such, the patient has been evaluated and assessed, work-up was performed and treatment was provided in alignment with urgent care protocols and evidence based medicine.  Patient/parent/caregiver has been advised that the patient may require follow up for further testing and treatment if the symptoms continue in spite of treatment, as clinically indicated and appropriate.  If the patient was tested for COVID-19, Influenza and/or RSV, then the patient/parent/guardian was advised to isolate at home pending the results of his/her diagnostic coronavirus test and potentially longer if they're positive. I have also advised pt that if his/her COVID-19 test returns positive, it's recommended to self-isolate for at least 10 days after symptoms first appeared AND until fever-free for 24 hours without fever reducer AND other symptoms have improved or resolved. Discussed self-isolation recommendations as well as instructions for household member/close contacts as per the [facility] and [place] DHHS, and also gave patient the COVID packet with this information.  Patient/parent/caregiver has  been advised to return to the [facility] or PCP in 3-5 days if no better; to PCP or the Emergency Department if new signs and symptoms develop, or if the current signs or symptoms continue to change or worsen for further workup, evaluation and treatment as clinically indicated and appropriate  The patient will follow up with their current PCP if and as advised. If the patient does not currently have a PCP we will assist them in obtaining one.   The patient may need specialty follow up if the symptoms continue, in spite of conservative treatment and management, for further workup, evaluation, consultation and treatment as clinically indicated and appropriate.  Patient/parent/caregiver verbalized  understanding and agreement of plan as discussed.  All questions were addressed during visit.  Please see discharge instructions below for further details of plan.  Discharge Instructions:   Discharge Instructions      Your symptoms and my physical exam findings are concerning for exacerbation of your underlying allergies.  It is important that you are consistent with taking allergy medications exactly as prescribed.     Please see the list below for recommended medications, dosages and frequencies to provide relief of your current symptoms:    Augmentin (amoxicillin - clavulanic acid):  take 1 tablet twice daily for 10 days, you can take it with or without food.  This antibiotic can cause upset stomach, this will resolve once antibiotics are complete.  You are welcome to use a probiotic, eat yogurt, take Imodium while taking this medication.  Please avoid other systemic medications such as Maalox, Pepto-Bismol or milk of magnesia as they can interfere with your body's ability to absorb the antibiotics.  I recommend that you take a proton pump inhibitor to reduce stomach acid while you are taking this medication.  I have sent a prescription for Prevacid to your pharmacy.  Please take 1 capsule daily.  Solu-Medrol (methylprednisolone):  To quickly address your significant respiratory inflammation, you were provided with an injection of methylprednisolone in the office today.  You should continue to feel the full benefit of the steroid for the next 4 to 6 hours.  No further steroids are recommended.  Zyrtec (cetirizine): This is an excellent second-generation antihistamine that helps to reduce respiratory inflammatory response to environmental allergens.  In some patients, this medication can cause daytime sleepiness so I recommend that you take 1 tablet daily at bedtime.  I have provided you with a prescription.   Flonase (fluticasone): This is a steroid nasal spray that you use once daily, 1 spray  in each nare.  This medication does not work well if you decide to use it only used as you feel you need to, it works best used on a daily basis.  After 3 to 5 days of use, you will notice significant reduction of the inflammation and mucus production that is currently being caused by exposure to allergens, whether seasonal or environmental.  The most common side effect of this medication is nosebleeds.  If you experience a nosebleed, please discontinue use for 1 week, then feel free to resume.  I have provided you with a prescription.   Atrovent (ipratropium): This is an excellent nasal decongestant spray that does not cause rebound congestion, please instill 2 sprays into each nare with each use.  Because nasal steroids can take several days before they begin to provide full benefit, I recommend that you use this spray in addition to the nasal steroid prescribed for you.  Please use it after  you have used your nasal steroid and repeat up to 4 times daily as needed.  I have provided you with a prescription for this medication.     Pataday (olopatadine): This is an antihistamine eyedrop that will reduce the itchiness and redness caused by allergies.  Please place 1 drop in each eye every 12 hours as needed.  Derm-otic (fluocinolone oil): This is a topical steroid oil that can be placed into your ears to calm the itching and inflammation in your ears.  Please instill 5 drops twice daily as needed.  You may find that after your initial symptoms have significantly improved, you may just want to use this drop once a week to keep earwax soft to keep things under control.  Please keep the following in mind:  Not taking your allergy medications on a regular basis increases your risk of more frequent upper respiratory infections that may or may not require the use of antibiotics, serious exacerbations that require the use of oral steroids, loss of time at work and missed social opportunities.   Antibiotics can  increase you risk of diarrhea, C. difficile infection, allergic reactions such as anaphylaxis, Stevens-Johnson syndrome, drug related rashes, yeast infections, systemic lupus.  Taking hormonal birth control pills, antibiotics can interfere with her ability to prevent pregnancy.   Oral steroids can cause muscle weakness, swelling in the extremities, mood swings, upset stomach with nausea and vomiting, difficulty sleeping, headaches, fatigue and fragile skin.  Oral steroids can also increase your risk of bacterial infection.   If you find that you have not had significant relief of your symptoms in the next 7 to 10 days, please follow-up with your primary care provider or return to urgent care for repeat evaluation.   Thank you for visiting urgent care today.  We appreciate the opportunity to participate in your care.     This office note has been dictated using Teaching laboratory technician.  Unfortunately, and despite my best efforts, this method of dictation can sometimes lead to occasional typographical or grammatical errors.  I apologize in advance if this occurs.     Theadora Rama Scales, PA-C 03/09/22 1104    Theadora Rama Scales, [place] 03/09/22 [phone]

## 2022-03-09 NOTE — ED Triage Notes (Signed)
Pt c/o sore throat, headache, itchy eyes, congestion/ runny nose that began yesterday. ?

## 2022-03-09 NOTE — Discharge Instructions (Addendum)
Your symptoms and my physical exam findings are concerning for exacerbation of your underlying allergies.  It is important that you are consistent with taking allergy medications exactly as prescribed.   ?  ?Please see the list below for recommended medications, dosages and frequencies to provide relief of your current symptoms:   ? ?Augmentin (amoxicillin - clavulanic acid):  take 1 tablet twice daily for 10 days, you can take it with or without food.  This antibiotic can cause upset stomach, this will resolve once antibiotics are complete.  You are welcome to use a probiotic, eat yogurt, take Imodium while taking this medication.  Please avoid other systemic medications such as Maalox, Pepto-Bismol or milk of magnesia as they can interfere with your body's ability to absorb the antibiotics.  I recommend that you take a proton pump inhibitor to reduce stomach acid while you are taking this medication.  I have sent a prescription for Prevacid to your pharmacy.  Please take 1 capsule daily. ? ?Solu-Medrol (methylprednisolone):  To quickly address your significant respiratory inflammation, you were provided with an injection of methylprednisolone in the office today.  You should continue to feel the full benefit of the steroid for the next 4 to 6 hours.  No further steroids are recommended. ? ?Zyrtec (cetirizine): This is an excellent second-generation antihistamine that helps to reduce respiratory inflammatory response to environmental allergens.  In some patients, this medication can cause daytime sleepiness so I recommend that you take 1 tablet daily at bedtime.  I have provided you with a prescription. ?  ?Flonase (fluticasone): This is a steroid nasal spray that you use once daily, 1 spray in each nare.  This medication does not work well if you decide to use it only used as you feel you need to, it works best used on a daily basis.  After 3 to 5 days of use, you will notice significant reduction of the  inflammation and mucus production that is currently being caused by exposure to allergens, whether seasonal or environmental.  The most common side effect of this medication is nosebleeds.  If you experience a nosebleed, please discontinue use for 1 week, then feel free to resume.  I have provided you with a prescription. ?  ?Atrovent (ipratropium): This is an excellent nasal decongestant spray that does not cause rebound congestion, please instill 2 sprays into each nare with each use.  Because nasal steroids can take several days before they begin to provide full benefit, I recommend that you use this spray in addition to the nasal steroid prescribed for you.  Please use it after you have used your nasal steroid and repeat up to 4 times daily as needed.  I have provided you with a prescription for this medication.    ? ?Pataday (olopatadine): This is an antihistamine eyedrop that will reduce the itchiness and redness caused by allergies.  Please place 1 drop in each eye every 12 hours as needed. ? ?Derm-otic (fluocinolone oil): This is a topical steroid oil that can be placed into your ears to calm the itching and inflammation in your ears.  Please instill 5 drops twice daily as needed.  You may find that after your initial symptoms have significantly improved, you may just want to use this drop once a week to keep earwax soft to keep things under control. ? ?Please keep the following in mind: ? ?Not taking your allergy medications on a regular basis increases your risk of more frequent upper respiratory infections  that may or may not require the use of antibiotics, serious exacerbations that require the use of oral steroids, loss of time at work and missed social opportunities. ?  ?Antibiotics can increase you risk of diarrhea, C. difficile infection, allergic reactions such as anaphylaxis, Stevens-Johnson syndrome, drug related rashes, yeast infections, systemic lupus.  Taking hormonal birth control pills,  antibiotics can interfere with her ability to prevent pregnancy. ?  ?Oral steroids can cause muscle weakness, swelling in the extremities, mood swings, upset stomach with nausea and vomiting, difficulty sleeping, headaches, fatigue and fragile skin.  Oral steroids can also increase your risk of bacterial infection. ?  ?If you find that you have not had significant relief of your symptoms in the next 7 to 10 days, please follow-up with your primary care provider or return to urgent care for repeat evaluation. ?  ?Thank you for visiting urgent care today.  We appreciate the opportunity to participate in your care. ? ?

## 2023-10-06 ENCOUNTER — Encounter (HOSPITAL_COMMUNITY): Payer: Self-pay

## 2023-10-06 ENCOUNTER — Emergency Department (HOSPITAL_COMMUNITY): Payer: BC Managed Care – PPO

## 2023-10-06 ENCOUNTER — Emergency Department (HOSPITAL_COMMUNITY)
Admission: EM | Admit: 2023-10-06 | Discharge: 2023-10-06 | Disposition: A | Discharge status: Home / Self Care | Payer: BC Managed Care – PPO | Attending: Emergency Medicine | Admitting: Emergency Medicine

## 2023-10-06 ENCOUNTER — Other Ambulatory Visit: Payer: Self-pay

## 2023-10-06 DIAGNOSIS — S0083XA Contusion of other part of head, initial encounter: Secondary | ICD-10-CM | POA: Diagnosis not present

## 2023-10-06 DIAGNOSIS — M25561 Pain in right knee: Secondary | ICD-10-CM | POA: Diagnosis not present

## 2023-10-06 DIAGNOSIS — M791 Myalgia, unspecified site: Secondary | ICD-10-CM | POA: Diagnosis not present

## 2023-10-06 DIAGNOSIS — Y92410 Unspecified street and highway as the place of occurrence of the external cause: Secondary | ICD-10-CM | POA: Diagnosis not present

## 2023-10-06 DIAGNOSIS — S0993XA Unspecified injury of face, initial encounter: Secondary | ICD-10-CM | POA: Diagnosis present

## 2023-10-06 DIAGNOSIS — M7918 Myalgia, other site: Secondary | ICD-10-CM

## 2023-10-06 MED ORDER — NAPROXEN 250 MG PO TABS
500.0000 mg | ORAL_TABLET | Freq: Once | ORAL | Status: DC
  Filled 2023-10-06: qty 2

## 2023-10-06 MED ORDER — ACETAMINOPHEN 500 MG PO TABS
1000.0000 mg | ORAL_TABLET | Freq: Once | ORAL | Status: AC
  Administered 2023-10-06: 1000 mg via ORAL
  Filled 2023-10-06: qty 2

## 2023-10-06 MED ORDER — NAPROXEN 500 MG PO TABS: 500.0000 mg | ORAL_TABLET | Freq: Two times a day (BID) | ORAL | 0 refills | Status: DC

## 2023-10-06 MED ORDER — METHOCARBAMOL 500 MG PO TABS: 500.0000 mg | ORAL_TABLET | Freq: Two times a day (BID) | ORAL | 0 refills | Status: AC | PRN

## 2023-10-06 MED ORDER — METHOCARBAMOL 500 MG PO TABS
500.0000 mg | ORAL_TABLET | Freq: Once | ORAL | Status: AC
  Administered 2023-10-06: 500 mg via ORAL
  Filled 2023-10-06: qty 1
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
## 2023-10-06 NOTE — ED Notes (Signed)
C-collar in place (by EMS)

## 2023-10-06 NOTE — Discharge Instructions (Addendum)
Alternate ice and heat to areas of injury 3-4 times per day to limit inflammation and spasm.  Avoid strenuous activity and heavy lifting.  We recommend consistent use of naproxen in addition to Robaxin for muscle spasms. Do not drive or drink alcohol after taking Robaxin as it may make you drowsy and impair your judgment.  We recommend follow-up with a primary care doctor to ensure resolution of symptoms.  Return to the ED for any new or concerning symptoms. 

## 2024-04-12 ENCOUNTER — Other Ambulatory Visit: Payer: Self-pay

## 2024-04-12 ENCOUNTER — Emergency Department (HOSPITAL_COMMUNITY)
Admission: EM | Admit: 2024-04-12 | Discharge: 2024-04-12 | Disposition: A | Discharge status: Home / Self Care | Attending: Emergency Medicine | Admitting: Emergency Medicine

## 2024-04-12 ENCOUNTER — Emergency Department (HOSPITAL_COMMUNITY)

## 2024-04-12 ENCOUNTER — Encounter (HOSPITAL_COMMUNITY): Payer: Self-pay

## 2024-04-12 DIAGNOSIS — N938 Other specified abnormal uterine and vaginal bleeding: Secondary | ICD-10-CM | POA: Diagnosis not present

## 2024-04-12 DIAGNOSIS — Z87891 Personal history of nicotine dependence: Secondary | ICD-10-CM | POA: Insufficient documentation

## 2024-04-12 DIAGNOSIS — N840 Polyp of corpus uteri: Secondary | ICD-10-CM | POA: Diagnosis not present

## 2024-04-12 DIAGNOSIS — J45909 Unspecified asthma, uncomplicated: Secondary | ICD-10-CM | POA: Insufficient documentation

## 2024-04-12 DIAGNOSIS — N83201 Unspecified ovarian cyst, right side: Secondary | ICD-10-CM | POA: Insufficient documentation

## 2024-04-12 DIAGNOSIS — N83209 Unspecified ovarian cyst, unspecified side: Secondary | ICD-10-CM

## 2024-04-12 DIAGNOSIS — N939 Abnormal uterine and vaginal bleeding, unspecified: Secondary | ICD-10-CM | POA: Diagnosis present

## 2024-04-12 LAB — CBC WITH DIFFERENTIAL/PLATELET
Abs Immature Granulocytes: 0 10*3/uL (ref 0.00–0.07)
Basophils Absolute: 0 10*3/uL (ref 0.0–0.1)
Basophils Relative: 1 %
Eosinophils Absolute: 0.1 10*3/uL (ref 0.0–0.5)
Eosinophils Relative: 2 %
HCT: 28.8 % — ABNORMAL LOW (ref 36.0–46.0)
Hemoglobin: 9 g/dL — ABNORMAL LOW (ref 12.0–15.0)
Immature Granulocytes: 0 %
Lymphocytes Relative: 38 %
Lymphs Abs: 1.6 10*3/uL (ref 0.7–4.0)
MCH: 26.2 pg (ref 26.0–34.0)
MCHC: 31.3 g/dL (ref 30.0–36.0)
MCV: 83.7 fL (ref 80.0–100.0)
Monocytes Absolute: 0.3 10*3/uL (ref 0.1–1.0)
Monocytes Relative: 8 %
Neutro Abs: 2.1 10*3/uL (ref 1.7–7.7)
Neutrophils Relative %: 51 %
Platelets: 202 10*3/uL (ref 150–400)
RBC: 3.44 MIL/uL — ABNORMAL LOW (ref 3.87–5.11)
RDW: 15.3 % (ref 11.5–15.5)
WBC: 4.1 10*3/uL (ref 4.0–10.5)
nRBC: 0 % (ref 0.0–0.2)

## 2024-04-12 LAB — COMPREHENSIVE METABOLIC PANEL WITH GFR
ALT: 16 U/L (ref 0–44)
AST: 21 U/L (ref 15–41)
Albumin: 3 g/dL — ABNORMAL LOW (ref 3.5–5.0)
Alkaline Phosphatase: 29 U/L — ABNORMAL LOW (ref 38–126)
Anion gap: 5 (ref 5–15)
BUN: 9 mg/dL (ref 6–20)
CO2: 25 mmol/L (ref 22–32)
Calcium: 8.2 mg/dL — ABNORMAL LOW (ref 8.9–10.3)
Chloride: 107 mmol/L (ref 98–111)
Creatinine, Ser: 0.74 mg/dL (ref 0.44–1.00)
GFR, Estimated: >60 mL/min (ref >60)
Glucose, Bld: 119 mg/dL — ABNORMAL HIGH (ref 70–99)
Potassium: 3.7 mmol/L (ref 3.5–5.1)
Sodium: 137 mmol/L (ref 135–145)
Total Bilirubin: 0.4 mg/dL (ref 0.0–1.2)
Total Protein: 6 g/dL — ABNORMAL LOW (ref 6.5–8.1)

## 2024-04-12 LAB — URINALYSIS, ROUTINE W REFLEX MICROSCOPIC
Bilirubin Urine: NEGATIVE
Glucose, UA: NEGATIVE mg/dL
Ketones, ur: NEGATIVE mg/dL
Leukocytes,Ua: NEGATIVE
Nitrite: NEGATIVE
Protein, ur: NEGATIVE mg/dL
RBC / HPF: >50 RBC/hpf (ref 0–5)
Specific Gravity, Urine: 1.013 (ref 1.005–1.030)
pH: 7 (ref 5.0–8.0)

## 2024-04-12 LAB — PROTIME-INR
INR: 1 (ref 0.8–1.2)
Prothrombin Time: 13.6 s (ref 11.4–15.2)

## 2024-04-12 LAB — PREGNANCY, URINE: Preg Test, Ur: NEGATIVE

## 2024-04-12 LAB — HCG, QUANTITATIVE, PREGNANCY: hCG, Beta Chain, Quant, S: <1 m[IU]/mL (ref <5)

## 2024-04-12 MED ORDER — MEGESTROL ACETATE 40 MG PO TABS: 40.0000 mg | ORAL_TABLET | Freq: Two times a day (BID) | ORAL | 0 refills | Status: AC

## 2024-04-12 MED ORDER — POLYSACCHARIDE IRON COMPLEX 150 MG PO CAPS: 150.0000 mg | ORAL_CAPSULE | Freq: Every day | ORAL | 0 refills | Status: AC

## 2024-04-12 NOTE — ED Triage Notes (Signed)
 Pt c.o heavy vaginal bleeding x 2 weeks with clots. States her menstrual cycles are normally 5 days. Denies any abd pain.

## 2024-04-12 NOTE — Discharge Instructions (Addendum)
 It was a pleasure caring for you today in the emergency department.  Please return if bleeding worsens or does not resolve  Please call obgyn to arrange follow up  Please return to the emergency department for any worsening or worrisome symptoms.

## 2024-04-12 NOTE — ED Notes (Signed)
Pt unable to provide UA at this time

## 2024-04-12 NOTE — ED Notes (Signed)
 Pt provided urine cup. Pt urinated in ultrasound and not provided collection cup. Unable to collect urine at this time. Pt provided water.

## 2024-04-12 NOTE — ED Notes (Addendum)
Pt off unit to ultrasound

## 2024-04-12 NOTE — ED Notes (Signed)
Pt ambulated to bathroom and provided UA

## 2024-04-12 NOTE — ED Provider Notes (Signed)
 [place] EMERGENCY DEPARTMENT AT [facility] Provider Note  CSN: [id number] Arrival date & time: 04/12/24 4098  Chief Complaint(s) Vaginal Bleeding  HPI Jessica Vasquez is a 33 y.o. female with past medical history as below, significant for cholelithiasis, asthma, anemia, hidradenitis, preeclampsia who presents to the ED with complaint of vaginal bleeding  Patient reports she has been having prolonged menstrual cycle, she has been bleeding for approximately 2 weeks.  Menstrual cycle typically last less than 1 week.  She has been passing some clots.  Blood is mostly dark red.  She has not been measuring her output, has not been using pads or tampons.   Denies similar symptoms in the past.  Normally has regular menstrual cycles.  No blood thinners or bleeding disorders.  No pelvic trauma.  Does report mild lightheadedness but no syncope, no nausea or vomiting or chest pain.  Past Medical History Past Medical History:  Diagnosis Date   [redacted] weeks gestation of pregnancy 10/31/2019   Anemia    Asthma    Cholelithiasis    Gestational hypertension 09/30/2019   Noted at 28 week visit Will need delivery @ 37 weeks   Group beta Strep positive 10/23/2019   Headache    Herpes    last outbreak two weeks ago ( mid march 2015)   Hydradenitis    Non-reassuring fetal heart tones complicating pregnancy, antepartum 10/31/2019   Preeclampsia 10/14/2019   No severe features  Guidelines for Antenatal Testing and Sonography  (with updated ICD-10 codes)  Updated  07-28-19 with Dr. Cassandria Clever  INDICATION U/S 2 X week NST/AFI  or full BPP wkly DELIVERY Diabetes   A1 - good control - O24.410    A2 - good control - O24.419      A2  - poor control or poor compliance - O24.419, E11.65   (Macrosomia or polyhydramnios) **E11.65 is extra code for poor contr   Sleep apnea    Supervision of high-risk pregnancy 06/09/2019    Nursing Staff Provider Office Location  [facility] Dating  LMP/1st trimester US    Language   ENGLISH Anatomy US   Normal with limited views> wnl Flu Vaccine  Declined 09/30/19 Genetic Screen  NIPS: low risk, female    AFP: low risk TDaP vaccine   declined 09/30/19 Hgb A1C or  GTT Early  Third trimester  Rhogam   NA, AB+   LAB RESULTS  Feeding Plan  Breastfeed Blood Type AB/Positive/-- (07/08 1333)    Trichimoniasis 01/14/2019   Patient Active Problem List   Diagnosis Date Noted   S/P cesarean section 11/04/2019   Severe preeclampsia, delivered 10/21/2019   GDM (gestational diabetes mellitus) 10/01/2019   Snoring 08/06/2019   Alpha thalassemia silent carrier 08/05/2019   Chronic nasal congestion 05/07/2019   Nasal polyp 05/07/2019   Anemia 10/20/2013   HSV (herpes simplex virus) infection 10/20/2013   Home Medication(s) Prior to Admission medications   Medication Sig Start Date End Date Taking? Authorizing Provider  iron polysaccharides (NIFEREX) 150 MG capsule Take 1 capsule (150 mg total) by mouth daily. 04/12/24 05/12/24 Yes Teddi Favors, DO  cetirizine  (ZYRTEC  ALLERGY) 10 MG tablet Take 1 tablet (10 mg total) by mouth at bedtime. 03/09/22 09/05/22  Eloise Hake Scales, PA-C  Fluocinolone  Acetonide (DERMOTIC ) 0.01 % OIL Place 5 drops in ear(s) 2 (two) times daily as needed (Itching in ears). 03/09/22   Eloise Hake Scales, PA-C  fluticasone  (FLONASE ) 50 MCG/ACT nasal spray Place 2 sprays into both nostrils daily. 03/09/22  Eloise Hake Scales, PA-C  ipratropium (ATROVENT ) 0.06 % nasal spray Place 2 sprays into both nostrils 4 (four) times daily. As needed for nasal congestion, runny nose 03/09/22   Eloise Hake Scales, PA-C  lansoprazole  (PREVACID ) 30 MG capsule Take 1 capsule (30 mg total) by mouth daily at 12 noon. 03/09/22   Eloise Hake Scales, PA-C  megestrol (MEGACE) 40 MG tablet Take 1 tablet (40 mg total) by mouth 2 (two) times daily for 7 days. 04/12/24 04/19/24 Yes Teddi Favors, DO  methocarbamol  (ROBAXIN ) 500 MG tablet Take 1 tablet (500 mg total) by  mouth every 12 (twelve) hours as needed for muscle spasms. 10/06/23   Carleton Cheek, PA-C  olopatadine  (PATADAY ) 0.1 % ophthalmic solution Place 1 drop into both eyes 2 (two) times daily. 03/09/22   Eloise Hake Scales, PA-C                                                                                                                                    Past Surgical History Past Surgical History:  Procedure Laterality Date   CESAREAN SECTION N/A 10/31/2019   Procedure: CESAREAN SECTION;  Surgeon: Raynell Caller, MD;  Location: [facility] LD ORS;  Service: Obstetrics;  Laterality: N/A;   CHOLECYSTECTOMY N/A 10/15/2015   Procedure: LAPAROSCOPIC CHOLECYSTECTOMY WITH INTRAOPERATIVE CHOLANGIOGRAM;  Surgeon: Dareen Ebbing, MD;  Location: [facility] OR;  Service: General;  Laterality: N/A;   ERCP N/A 10/16/2015   Procedure: ENDOSCOPIC RETROGRADE CHOLANGIOPANCREATOGRAPHY (ERCP);  Surgeon: Kenney Peacemaker, MD;  Location[facility] ENDOSCOPY;  Service: Endoscopy;  Laterality: N/A;   nasal sugery     TUBAL LIGATION Bilateral 10/31/2019   Procedure: BILATERAL TUBAL LIGATION;  Surgeon: Raynell Caller, MD;  Location: [facility] LD ORS;  Service: Obstetrics;  Laterality: Bilateral;   Family History Family History  Problem Relation Age of Onset   Gallstones Mother    Arthritis Mother    ADD / ADHD Brother    Diabetes Maternal Grandmother    Heart disease Maternal Grandmother    Hypertension Maternal Grandmother    Stroke Maternal Grandmother     Social History Social History   Tobacco Use   Smoking status: Former    Current packs/day: 0.00    Types: Cigarettes    Quit date: 05/16/2011    Years since quitting: 12.9   Smokeless tobacco: Never  Vaping Use   Vaping status: Never Used  Substance Use Topics   Alcohol use: No   Drug use: No   Allergies Nsaids  Review of Systems A thorough review of systems was obtained and all systems are negative except as noted in the HPI and PMH.   Physical Exam Vital Signs  I  have reviewed the triage vital signs BP 109/79 (BP Location: Right Arm)   Pulse 72   Temp 98.8 F (37.1 C)   Resp 14   Ht 5\' 10"  (1.778 m)   Wt 79.4 kg  LMP 03/26/2024   SpO2 100%   Breastfeeding No   BMI 25.11 kg/m  Physical Exam Vitals and nursing note reviewed.  Constitutional:      General: She is not in acute distress.    Appearance: Normal appearance.  HENT:     Head: Normocephalic and atraumatic.     Right Ear: External ear normal.     Left Ear: External ear normal.     Nose: Nose normal.     Mouth/Throat:     Mouth: Mucous membranes are moist.  Eyes:     General: No scleral icterus.       Right eye: No discharge.        Left eye: No discharge.  Cardiovascular:     Rate and Rhythm: Normal rate and regular rhythm.     Pulses: Normal pulses.     Heart sounds: Normal heart sounds.  Pulmonary:     Effort: Pulmonary effort is normal. No respiratory distress.     Breath sounds: Normal breath sounds. No stridor.  Abdominal:     General: Abdomen is flat. There is no distension.     Palpations: Abdomen is soft.     Tenderness: There is no abdominal tenderness.  Musculoskeletal:     Cervical back: No rigidity.     Right lower leg: No edema.     Left lower leg: No edema.  Skin:    General: Skin is warm and dry.     Capillary Refill: Capillary refill takes less than 2 seconds.  Neurological:     Mental Status: She is alert.  Psychiatric:        Mood and Affect: Mood normal.        Behavior: Behavior normal. Behavior is cooperative.     ED Results and Treatments Labs (all labs ordered are listed, but only abnormal results are displayed) Labs Reviewed  CBC WITH DIFFERENTIAL/PLATELET - Abnormal; Notable for the following components:      Result Value   RBC 3.44 (*)    Hemoglobin 9.0 (*)    HCT 28.8 (*)    All other components within normal limits  COMPREHENSIVE METABOLIC PANEL WITH GFR - Abnormal; Notable for the following components:   Glucose, Bld 119 (*)     Calcium  8.2 (*)    Total Protein 6.0 (*)    Albumin 3.0 (*)    Alkaline Phosphatase 29 (*)    All other components within normal limits  URINALYSIS, ROUTINE W REFLEX MICROSCOPIC - Abnormal; Notable for the following components:   Color, Urine STRAW (*)    APPearance HAZY (*)    Hgb urine dipstick LARGE (*)    Bacteria, UA RARE (*)    All other components within normal limits  PROTIME-INR  PREGNANCY, URINE  HCG, QUANTITATIVE, PREGNANCY  Radiology US  PELVIC COMPLETE WITH TRANSVAGINAL Result Date: 04/12/2024 CLINICAL DATA:  [id number] Vagina bleeding [id number] EXAM: ULTRASOUND OF PELVIS TECHNIQUE: Transabdominal and transvaginalultrasound examination of the pelvis was performed including evaluation of the uterus, ovaries, adnexal regions, and pelvic cul-de-sac. COMPARISON:  06/02/2019. FINDINGS: Uterusanteverted, 10 x 7 x 5 cm. The endometrium measured 0.4 cm. Minimal fluid in the uterine cavity. 6 mm echogenic polyp versus debris in the uterine cavity. There are no uterine masses. Right ovary Dominant simple cyst measures 3.0 cm. Ovary measurements are 4.0 x 3.8 x 2.3 cm. Left ovary Unremarkable measuring 2.8 x 2.0 x 1.5 cm. Images of the adnexae demonstrated no masses or fluid collections. Color Doppler demonstrated ovarian blood flow. IMPRESSION: 1. 6 mm echogenic focus in the uterine cavity which may represent a polyp versus debris. 2. 3 cm right ovarian cyst. Follow up not typically considered necessary for this finding. Electronically Signed   By: Sydell Eva M.D.   On: 04/12/2024 13:00    Pertinent labs & imaging results that were available during my care of the patient were reviewed by me and considered in my medical decision making (see MDM for details).  Medications Ordered in ED Medications - No data to display                                                                                                                                    Procedures Procedures  (including critical care time)  Medical Decision Making / ED Course    Medical Decision Making:    Jamara Chasin is a 33 y.o. female with past medical history as below, significant for cholelithiasis, asthma, anemia, hidradenitis, preeclampsia who presents to the ED with complaint of vaginal bleeding. The complaint involves an extensive differential diagnosis and also carries with it a high risk of complications and morbidity.  Serious etiology was considered. Ddx includes but is not limited to: Adenomyosis, leiomyoma, DVT, endometriosis, malignancy ovulatory dysfunction, etc.  Complete initial physical exam performed, notably the patient was in distress, HDS.    Reviewed and confirmed nursing documentation for past medical history, family history, social history.  Vital signs reviewed.     Brief summary:  33 year old female with history above here with menorrhagia.  Menses lasting approximately 2 weeks, normally less than 1 week.  Passing some clots, bleeding mostly dark red.  Initial exam is reassuring, abdomen nontender, she is HDS.  Does not take blood thinners.  Will get screening labs, get pelvic ultrasound   Clinical Course as of 04/12/24 1552  Sun Apr 12, 2024  1406 Hemoglobin(!): 9.0 Last hgb was 12.2 but this was 2 years ago. Prior to that was around 10 [SG]    Clinical Course User Index [SG] Teddi Favors, DO    Patient feels her bleeding has improved, nearly resolved on recheck.  Ultrasound reviewed, she does have likely uterine polyp and cyst.  Will start her on Megace.  Advised to follow-up with OB/GYN.  She is also asking refill on her iron medication which she has not taken for more than 6 months.  Patient in no distress and overall condition improved here in the ED. Detailed discussions were had with the patient/guardian regarding current findings, and need for  close f/u with PCP or on call doctor. The patient/guardian has been instructed to return immediately if the symptoms worsen in any way for re-evaluation. Patient/guardian verbalized understanding and is in agreement with current care plan. All questions answered prior to discharge.              Additional history obtained: -Additional history obtained from na -External records from outside source obtained and reviewed including: Chart review including previous notes, labs, imaging, consultation notes including  Prior labs Home meds   Lab Tests: -I ordered, reviewed, and interpreted labs.   The pertinent results include:   Labs Reviewed  CBC WITH DIFFERENTIAL/PLATELET - Abnormal; Notable for the following components:      Result Value   RBC 3.44 (*)    Hemoglobin 9.0 (*)    HCT 28.8 (*)    All other components within normal limits  COMPREHENSIVE METABOLIC PANEL WITH GFR - Abnormal; Notable for the following components:   Glucose, Bld 119 (*)    Calcium  8.2 (*)    Total Protein 6.0 (*)    Albumin 3.0 (*)    Alkaline Phosphatase 29 (*)    All other components within normal limits  URINALYSIS, ROUTINE W REFLEX MICROSCOPIC - Abnormal; Notable for the following components:   Color, Urine STRAW (*)    APPearance HAZY (*)    Hgb urine dipstick LARGE (*)    Bacteria, UA RARE (*)    All other components within normal limits  PROTIME-INR  PREGNANCY, URINE  HCG, QUANTITATIVE, PREGNANCY    Notable for hgb low,   EKG   EKG Interpretation Date/Time:    Ventricular Rate:    PR Interval:    QRS Duration:    QT Interval:    QTC Calculation:   R Axis:      Text Interpretation:           Imaging Studies ordered: I ordered imaging studies including tvus I independently visualized the following imaging with scope of interpretation limited to determining acute life threatening conditions related to emergency care; findings noted above I agree with the radiologist  interpretation If any imaging was obtained with contrast I closely monitored patient for any possible adverse reaction a/w contrast administration in the emergency department   Medicines ordered and prescription drug management: Meds ordered this encounter  Medications   megestrol (MEGACE) 40 MG tablet    Sig: Take 1 tablet (40 mg total) by mouth 2 (two) times daily for 7 days.    Dispense:  14 tablet    Refill:  0   iron polysaccharides (NIFEREX) 150 MG capsule    Sig: Take 1 capsule (150 mg total) by mouth daily.    Dispense:  30 capsule    Refill:  0    -I have reviewed the patients home medicines and have made adjustments as needed   Consultations Obtained: na   Cardiac Monitoring:  Continuous pulse oximetry interpreted by myself, 100% on RA.    Social Determinants of Health:  Diagnosis or treatment significantly limited by social determinants of health: former smoker   Reevaluation: After the interventions noted above, I reevaluated the patient and found that they have improved  Co morbidities that complicate the patient evaluation  Past Medical History:  Diagnosis Date   [redacted] weeks gestation of pregnancy 10/31/2019   Anemia    Asthma    Cholelithiasis    Gestational hypertension 09/30/2019   Noted at 28 week visit Will need delivery @ 37 weeks   Group beta Strep positive 10/23/2019   Headache    Herpes    last outbreak two weeks ago ( mid march 2015)   Hydradenitis    Non-reassuring fetal heart tones complicating pregnancy, antepartum 10/31/2019   Preeclampsia 10/14/2019   No severe features  Guidelines for Antenatal Testing and Sonography  (with updated ICD-10 codes)  Updated  07-27-19 with Dr. Cassandria Clever  INDICATION U/S 2 X week NST/AFI  or full BPP wkly DELIVERY Diabetes   A1 - good control - O24.410    A2 - good control - O24.419      A2  - poor control or poor compliance - O24.419, E11.65   (Macrosomia or polyhydramnios) **E11.65 is extra code for poor  contr   Sleep apnea    Supervision of high-risk pregnancy 06/09/2019    Nursing Staff Provider Office Location  [facility] Dating  LMP/1st trimester US   Language   ENGLISH Anatomy US   Normal with limited views> wnl Flu Vaccine  Declined 09/30/19 Genetic Screen  NIPS: low risk, female    AFP: low risk TDaP vaccine   declined 09/30/19 Hgb A1C or  GTT Early  Third trimester  Rhogam   NA, AB+   LAB RESULTS  Feeding Plan  Breastfeed Blood Type AB/Positive/-- (07/08 1333)    Trichimoniasis 01/14/2019      Dispostion: Disposition decision including need for hospitalization was considered, and patient discharged from emergency department.    Final Clinical Impression(s) / ED Diagnoses Final diagnoses:  DUB (dysfunctional uterine bleeding)  Uterine polyp  Cyst of ovary, unspecified laterality        Teddi Favors, DO 04/12/24 1552

## 2025-02-25 ENCOUNTER — Encounter: Payer: Self-pay | Admitting: Obstetrics & Gynecology
